# Patient Record
Sex: Female | Born: 1960
Health system: Southern US, Community
[De-identification: ages and names within clinical notes are randomized; demographics above are authoritative.]

## PROBLEM LIST (undated history)

## (undated) DIAGNOSIS — E538 Deficiency of other specified B group vitamins: Secondary | ICD-10-CM

## (undated) DIAGNOSIS — G609 Hereditary and idiopathic neuropathy, unspecified: Secondary | ICD-10-CM

## (undated) DIAGNOSIS — H9190 Unspecified hearing loss, unspecified ear: Secondary | ICD-10-CM

## (undated) DIAGNOSIS — Q254 Congenital malformation of aorta unspecified: Secondary | ICD-10-CM

## (undated) DIAGNOSIS — H8309 Labyrinthitis, unspecified ear: Secondary | ICD-10-CM

## (undated) DIAGNOSIS — R5381 Other malaise: Secondary | ICD-10-CM

## (undated) DIAGNOSIS — R5383 Other fatigue: Secondary | ICD-10-CM

## (undated) DIAGNOSIS — C801 Malignant (primary) neoplasm, unspecified: Secondary | ICD-10-CM

## (undated) DIAGNOSIS — M7500 Adhesive capsulitis of unspecified shoulder: Secondary | ICD-10-CM

## (undated) DIAGNOSIS — I719 Aortic aneurysm of unspecified site, without rupture: Secondary | ICD-10-CM

## (undated) DIAGNOSIS — Z923 Personal history of irradiation: Secondary | ICD-10-CM

## (undated) DIAGNOSIS — F419 Anxiety disorder, unspecified: Secondary | ICD-10-CM

## (undated) DIAGNOSIS — C50919 Malignant neoplasm of unspecified site of unspecified female breast: Secondary | ICD-10-CM

## (undated) DIAGNOSIS — Z9221 Personal history of antineoplastic chemotherapy: Secondary | ICD-10-CM

## (undated) DIAGNOSIS — H9319 Tinnitus, unspecified ear: Secondary | ICD-10-CM

## (undated) HISTORY — DX: Tinnitus, unspecified ear: H93.19

## (undated) HISTORY — DX: Labyrinthitis, unspecified ear: H83.09

## (undated) HISTORY — DX: Hereditary and idiopathic neuropathy, unspecified: G60.9

## (undated) HISTORY — DX: Adhesive capsulitis of unspecified shoulder: M75.00

## (undated) HISTORY — DX: Other fatigue: R53.83

## (undated) HISTORY — DX: Malignant (primary) neoplasm, unspecified: C80.1

## (undated) HISTORY — PX: OTHER SURGICAL HISTORY: SHX169

## (undated) HISTORY — DX: Anxiety disorder, unspecified: F41.9

## (undated) HISTORY — DX: Unspecified hearing loss, unspecified ear: H91.90

## (undated) HISTORY — DX: Congenital malformation of aorta unspecified: Q25.40

## (undated) HISTORY — PX: BREAST LUMPECTOMY: SHX2

## (undated) HISTORY — DX: Other malaise: R53.81

## (undated) HISTORY — DX: Deficiency of other specified B group vitamins: E53.8

## (undated) HISTORY — DX: Aortic aneurysm of unspecified site, without rupture: I71.9

## (undated) HISTORY — DX: Malignant neoplasm of unspecified site of unspecified female breast: C50.919

---

## 1979-05-01 HISTORY — PX: BREAST FIBROADENOMA SURGERY: SHX580

## 1980-04-30 HISTORY — PX: LAPAROSCOPY: SHX197

## 1999-11-22 ENCOUNTER — Encounter: Payer: Self-pay | Admitting: Neurology

## 1999-11-22 ENCOUNTER — Encounter: Admission: RE | Admit: 1999-11-22 | Discharge: 1999-11-22 | Payer: Self-pay | Admitting: Neurology

## 1999-11-26 ENCOUNTER — Ambulatory Visit (HOSPITAL_COMMUNITY): Admission: RE | Admit: 1999-11-26 | Discharge: 1999-11-26 | Payer: Self-pay | Admitting: Neurology

## 1999-11-26 ENCOUNTER — Encounter: Payer: Self-pay | Admitting: Neurology

## 2000-05-09 ENCOUNTER — Encounter: Payer: Self-pay | Admitting: Orthopedic Surgery

## 2000-05-09 ENCOUNTER — Ambulatory Visit (HOSPITAL_COMMUNITY): Admission: RE | Admit: 2000-05-09 | Discharge: 2000-05-09 | Payer: Self-pay | Admitting: Orthopedic Surgery

## 2000-07-05 ENCOUNTER — Other Ambulatory Visit: Admission: RE | Admit: 2000-07-05 | Discharge: 2000-07-05 | Payer: Self-pay | Admitting: Obstetrics and Gynecology

## 2001-04-30 HISTORY — PX: BASAL CELL CARCINOMA EXCISION: SHX1214

## 2001-07-17 ENCOUNTER — Other Ambulatory Visit: Admission: RE | Admit: 2001-07-17 | Discharge: 2001-07-17 | Payer: Self-pay | Admitting: Obstetrics and Gynecology

## 2001-11-03 ENCOUNTER — Ambulatory Visit: Admission: RE | Admit: 2001-11-03 | Discharge: 2001-11-03 | Payer: Self-pay | Admitting: Internal Medicine

## 2002-01-26 ENCOUNTER — Encounter: Admission: RE | Admit: 2002-01-26 | Discharge: 2002-01-26 | Payer: Self-pay | Admitting: Obstetrics and Gynecology

## 2002-01-26 ENCOUNTER — Encounter: Payer: Self-pay | Admitting: Obstetrics and Gynecology

## 2002-01-29 ENCOUNTER — Ambulatory Visit (HOSPITAL_COMMUNITY): Admission: RE | Admit: 2002-01-29 | Discharge: 2002-01-29 | Payer: Self-pay | Admitting: Obstetrics and Gynecology

## 2002-03-09 ENCOUNTER — Emergency Department (HOSPITAL_COMMUNITY): Admission: EM | Admit: 2002-03-09 | Discharge: 2002-03-09 | Payer: Self-pay | Admitting: Emergency Medicine

## 2002-09-16 ENCOUNTER — Encounter: Payer: Self-pay | Admitting: Obstetrics and Gynecology

## 2002-09-16 ENCOUNTER — Encounter: Admission: RE | Admit: 2002-09-16 | Discharge: 2002-09-16 | Payer: Self-pay | Admitting: Obstetrics and Gynecology

## 2002-11-25 ENCOUNTER — Encounter: Payer: Self-pay | Admitting: Orthopedic Surgery

## 2002-12-01 ENCOUNTER — Ambulatory Visit (HOSPITAL_COMMUNITY): Admission: RE | Admit: 2002-12-01 | Discharge: 2002-12-01 | Payer: Self-pay | Admitting: Orthopedic Surgery

## 2003-06-06 ENCOUNTER — Ambulatory Visit (HOSPITAL_COMMUNITY): Admission: RE | Admit: 2003-06-06 | Discharge: 2003-06-06 | Payer: Self-pay | Admitting: Orthopedic Surgery

## 2003-09-13 ENCOUNTER — Other Ambulatory Visit: Admission: RE | Admit: 2003-09-13 | Discharge: 2003-09-13 | Payer: Self-pay | Admitting: Obstetrics and Gynecology

## 2003-09-20 ENCOUNTER — Encounter: Admission: RE | Admit: 2003-09-20 | Discharge: 2003-09-20 | Payer: Self-pay | Admitting: Obstetrics and Gynecology

## 2004-10-09 ENCOUNTER — Ambulatory Visit: Payer: Self-pay | Admitting: Internal Medicine

## 2004-10-18 ENCOUNTER — Other Ambulatory Visit: Admission: RE | Admit: 2004-10-18 | Discharge: 2004-10-18 | Payer: Self-pay | Admitting: Obstetrics and Gynecology

## 2004-10-25 ENCOUNTER — Encounter: Admission: RE | Admit: 2004-10-25 | Discharge: 2004-10-25 | Payer: Self-pay | Admitting: Obstetrics and Gynecology

## 2004-11-03 ENCOUNTER — Encounter: Admission: RE | Admit: 2004-11-03 | Discharge: 2004-11-03 | Payer: Self-pay | Admitting: Obstetrics and Gynecology

## 2004-11-12 ENCOUNTER — Encounter: Admission: RE | Admit: 2004-11-12 | Discharge: 2004-11-12 | Payer: Self-pay | Admitting: Obstetrics and Gynecology

## 2004-11-13 ENCOUNTER — Encounter: Admission: RE | Admit: 2004-11-13 | Discharge: 2004-11-13 | Payer: Self-pay | Admitting: Obstetrics and Gynecology

## 2004-11-13 ENCOUNTER — Encounter (INDEPENDENT_AMBULATORY_CARE_PROVIDER_SITE_OTHER): Payer: Self-pay | Admitting: Specialist

## 2004-12-01 ENCOUNTER — Ambulatory Visit: Payer: Self-pay

## 2004-12-01 ENCOUNTER — Encounter: Admission: RE | Admit: 2004-12-01 | Discharge: 2004-12-01 | Payer: Self-pay | Admitting: Internal Medicine

## 2004-12-22 ENCOUNTER — Ambulatory Visit: Payer: Self-pay | Admitting: Internal Medicine

## 2005-01-03 ENCOUNTER — Ambulatory Visit: Payer: Self-pay | Admitting: Cardiology

## 2005-01-24 ENCOUNTER — Ambulatory Visit: Payer: Self-pay | Admitting: Cardiology

## 2005-02-05 ENCOUNTER — Ambulatory Visit: Payer: Self-pay | Admitting: Cardiology

## 2005-03-19 ENCOUNTER — Encounter: Admission: RE | Admit: 2005-03-19 | Discharge: 2005-03-19 | Payer: Self-pay | Admitting: Interventional Radiology

## 2005-05-17 ENCOUNTER — Encounter: Admission: RE | Admit: 2005-05-17 | Discharge: 2005-05-17 | Payer: Self-pay | Admitting: Internal Medicine

## 2005-05-25 ENCOUNTER — Encounter: Admission: RE | Admit: 2005-05-25 | Discharge: 2005-05-25 | Payer: Self-pay | Admitting: Obstetrics and Gynecology

## 2005-05-30 ENCOUNTER — Encounter: Admission: RE | Admit: 2005-05-30 | Discharge: 2005-05-30 | Payer: Self-pay | Admitting: Obstetrics and Gynecology

## 2005-09-10 ENCOUNTER — Other Ambulatory Visit: Admission: RE | Admit: 2005-09-10 | Discharge: 2005-09-10 | Payer: Self-pay | Admitting: Obstetrics and Gynecology

## 2005-09-13 ENCOUNTER — Ambulatory Visit: Payer: Self-pay | Admitting: Internal Medicine

## 2005-11-05 ENCOUNTER — Encounter: Admission: RE | Admit: 2005-11-05 | Discharge: 2005-11-05 | Payer: Self-pay | Admitting: Internal Medicine

## 2005-12-27 ENCOUNTER — Ambulatory Visit: Payer: Self-pay | Admitting: Internal Medicine

## 2006-04-08 ENCOUNTER — Ambulatory Visit: Payer: Self-pay | Admitting: Internal Medicine

## 2006-04-08 LAB — CONVERTED CEMR LAB
ALT: 19 units/L (ref 0–40)
Alkaline Phosphatase: 47 units/L (ref 39–117)
Bacteria, U Microscopic: NEGATIVE /hpf
Basophils Relative: 1.7 % — ABNORMAL HIGH (ref 0.0–1.0)
Crystals: NEGATIVE
GFR calc non Af Amer: 82 mL/min
Glomerular Filtration Rate, Af Am: 99 mL/min/{1.73_m2}
Glucose, Bld: 101 mg/dL — ABNORMAL HIGH (ref 70–99)
HDL: 51.8 mg/dL (ref >39.0)
Monocytes Absolute: 0.4 10*3/uL (ref 0.2–0.7)
Platelets: 344 10*3/uL (ref 150–400)
Potassium: 4.5 meq/L (ref 3.5–5.1)
RBC: 4.95 M/uL (ref 3.87–5.11)
RDW: 12.2 % (ref 11.5–14.6)
Sodium: 138 meq/L (ref 135–145)
Specific Gravity, Urine: 1.015 (ref 1.000–1.03)
Total Bilirubin: 1 mg/dL (ref 0.3–1.2)
Total Protein, Urine: NEGATIVE mg/dL
Total Protein: 6.5 g/dL (ref 6.0–8.3)
VLDL: 11 mg/dL (ref 0–40)
WBC: 9.9 10*3/uL (ref 4.5–10.5)
pH: 7 (ref 5.0–8.0)

## 2006-04-16 ENCOUNTER — Ambulatory Visit: Payer: Self-pay | Admitting: Internal Medicine

## 2006-04-30 DIAGNOSIS — Z923 Personal history of irradiation: Secondary | ICD-10-CM

## 2006-04-30 DIAGNOSIS — C801 Malignant (primary) neoplasm, unspecified: Secondary | ICD-10-CM

## 2006-04-30 HISTORY — DX: Malignant (primary) neoplasm, unspecified: C80.1

## 2006-04-30 HISTORY — DX: Personal history of irradiation: Z92.3

## 2006-05-03 ENCOUNTER — Encounter: Admission: RE | Admit: 2006-05-03 | Discharge: 2006-05-03 | Payer: Self-pay | Admitting: Internal Medicine

## 2006-05-17 ENCOUNTER — Encounter: Admission: RE | Admit: 2006-05-17 | Discharge: 2006-05-17 | Payer: Self-pay | Admitting: Internal Medicine

## 2006-06-20 ENCOUNTER — Ambulatory Visit: Payer: Self-pay | Admitting: Internal Medicine

## 2006-07-04 ENCOUNTER — Ambulatory Visit: Payer: Self-pay | Admitting: Internal Medicine

## 2006-07-04 ENCOUNTER — Emergency Department (HOSPITAL_COMMUNITY): Admission: EM | Admit: 2006-07-04 | Discharge: 2006-07-04 | Payer: Self-pay | Admitting: Emergency Medicine

## 2006-07-04 LAB — CONVERTED CEMR LAB
BUN: 20 mg/dL (ref 6–23)
Basophils Absolute: 0 10*3/uL (ref 0.0–0.1)
Basophils Relative: 0.2 % (ref 0.0–1.0)
CO2: 23 meq/L (ref 19–32)
Calcium: 9.1 mg/dL (ref 8.4–10.5)
Chloride: 102 meq/L (ref 96–112)
Creatinine, Ser: 0.8 mg/dL (ref 0.4–1.2)
Eosinophils Absolute: 0.1 10*3/uL (ref 0.0–0.6)
Eosinophils Relative: 1.1 % (ref 0.0–5.0)
GFR calc Af Amer: 99 mL/min
GFR calc non Af Amer: 82 mL/min
Glucose, Bld: 89 mg/dL (ref 70–99)
HCT: 43.7 % (ref 36.0–46.0)
Hemoglobin: 14.6 g/dL (ref 12.0–15.0)
Lymphocytes Relative: 25.8 % (ref 12.0–46.0)
MCHC: 33.5 g/dL (ref 30.0–36.0)
MCV: 90.8 fL (ref 78.0–100.0)
Monocytes Absolute: 0.6 10*3/uL (ref 0.2–0.7)
Monocytes Relative: 7.2 % (ref 3.0–11.0)
Neutro Abs: 5.7 10*3/uL (ref 1.4–7.7)
Neutrophils Relative %: 65.7 % (ref 43.0–77.0)
Platelets: 333 10*3/uL (ref 150–400)
Potassium: 4.9 meq/L (ref 3.5–5.1)
RBC: 4.81 M/uL (ref 3.87–5.11)
RDW: 12.6 % (ref 11.5–14.6)
Sodium: 136 meq/L (ref 135–145)
WBC: 8.6 10*3/uL (ref 4.5–10.5)

## 2006-07-10 ENCOUNTER — Inpatient Hospital Stay (HOSPITAL_COMMUNITY): Admission: AD | Admit: 2006-07-10 | Discharge: 2006-07-10 | Payer: Self-pay | Admitting: Obstetrics and Gynecology

## 2006-08-05 ENCOUNTER — Encounter: Admission: RE | Admit: 2006-08-05 | Discharge: 2006-08-05 | Payer: Self-pay | Admitting: Obstetrics and Gynecology

## 2006-08-11 ENCOUNTER — Encounter: Admission: RE | Admit: 2006-08-11 | Discharge: 2006-08-11 | Payer: Self-pay | Admitting: Obstetrics and Gynecology

## 2006-08-15 ENCOUNTER — Encounter: Admission: RE | Admit: 2006-08-15 | Discharge: 2006-08-15 | Payer: Self-pay | Admitting: Obstetrics and Gynecology

## 2006-08-19 ENCOUNTER — Ambulatory Visit (HOSPITAL_COMMUNITY): Admission: RE | Admit: 2006-08-19 | Discharge: 2006-08-19 | Payer: Self-pay | Admitting: Interventional Radiology

## 2006-08-22 ENCOUNTER — Ambulatory Visit (HOSPITAL_COMMUNITY): Admission: RE | Admit: 2006-08-22 | Discharge: 2006-08-23 | Payer: Self-pay | Admitting: Interventional Radiology

## 2006-09-10 ENCOUNTER — Encounter: Admission: RE | Admit: 2006-09-10 | Discharge: 2006-09-10 | Payer: Self-pay | Admitting: Interventional Radiology

## 2006-10-15 ENCOUNTER — Encounter: Admission: RE | Admit: 2006-10-15 | Discharge: 2006-10-15 | Payer: Self-pay | Admitting: Obstetrics and Gynecology

## 2006-11-08 ENCOUNTER — Encounter: Admission: RE | Admit: 2006-11-08 | Discharge: 2006-11-08 | Payer: Self-pay | Admitting: Obstetrics and Gynecology

## 2006-11-25 ENCOUNTER — Encounter: Admission: RE | Admit: 2006-11-25 | Discharge: 2006-11-25 | Payer: Self-pay | Admitting: Internal Medicine

## 2006-11-28 ENCOUNTER — Encounter: Admission: RE | Admit: 2006-11-28 | Discharge: 2006-11-28 | Payer: Self-pay | Admitting: Obstetrics and Gynecology

## 2007-01-20 ENCOUNTER — Encounter (INDEPENDENT_AMBULATORY_CARE_PROVIDER_SITE_OTHER): Payer: Self-pay | Admitting: Diagnostic Radiology

## 2007-01-20 ENCOUNTER — Encounter: Admission: RE | Admit: 2007-01-20 | Discharge: 2007-01-20 | Payer: Self-pay | Admitting: Internal Medicine

## 2007-01-29 ENCOUNTER — Ambulatory Visit: Payer: Self-pay | Admitting: Oncology

## 2007-02-03 ENCOUNTER — Ambulatory Visit: Admission: RE | Admit: 2007-02-03 | Discharge: 2007-03-20 | Payer: Self-pay | Admitting: Radiation Oncology

## 2007-02-05 ENCOUNTER — Encounter: Payer: Self-pay | Admitting: Internal Medicine

## 2007-02-24 ENCOUNTER — Encounter: Admission: RE | Admit: 2007-02-24 | Discharge: 2007-02-24 | Payer: Self-pay | Admitting: Surgery

## 2007-02-26 ENCOUNTER — Ambulatory Visit (HOSPITAL_BASED_OUTPATIENT_CLINIC_OR_DEPARTMENT_OTHER): Admission: RE | Admit: 2007-02-26 | Discharge: 2007-02-26 | Payer: Self-pay | Admitting: Surgery

## 2007-02-26 ENCOUNTER — Encounter (INDEPENDENT_AMBULATORY_CARE_PROVIDER_SITE_OTHER): Payer: Self-pay | Admitting: Surgery

## 2007-02-26 ENCOUNTER — Encounter: Payer: Self-pay | Admitting: Internal Medicine

## 2007-02-26 ENCOUNTER — Encounter: Admission: RE | Admit: 2007-02-26 | Discharge: 2007-02-26 | Payer: Self-pay | Admitting: Surgery

## 2007-03-10 ENCOUNTER — Encounter: Payer: Self-pay | Admitting: Internal Medicine

## 2007-03-11 ENCOUNTER — Encounter: Payer: Self-pay | Admitting: Internal Medicine

## 2007-03-13 ENCOUNTER — Encounter: Payer: Self-pay | Admitting: Internal Medicine

## 2007-03-19 ENCOUNTER — Encounter: Payer: Self-pay | Admitting: Internal Medicine

## 2007-03-19 ENCOUNTER — Ambulatory Visit (HOSPITAL_BASED_OUTPATIENT_CLINIC_OR_DEPARTMENT_OTHER): Admission: RE | Admit: 2007-03-19 | Discharge: 2007-03-19 | Payer: Self-pay | Admitting: Surgery

## 2007-03-19 ENCOUNTER — Encounter (INDEPENDENT_AMBULATORY_CARE_PROVIDER_SITE_OTHER): Payer: Self-pay | Admitting: Surgery

## 2007-03-26 ENCOUNTER — Ambulatory Visit: Payer: Self-pay | Admitting: Oncology

## 2007-04-01 ENCOUNTER — Encounter: Payer: Self-pay | Admitting: Internal Medicine

## 2007-04-01 LAB — CBC WITH DIFFERENTIAL/PLATELET
Basophils Absolute: 0.1 10*3/uL (ref 0.0–0.1)
EOS%: 2.2 % (ref 0.0–7.0)
Eosinophils Absolute: 0.2 10*3/uL (ref 0.0–0.5)
HCT: 39.8 % (ref 34.8–46.6)
HGB: 14.4 g/dL (ref 11.6–15.9)
MCH: 31.2 pg (ref 26.0–34.0)
NEUT%: 69.4 % (ref 39.6–76.8)
lymph#: 1.8 10*3/uL (ref 0.9–3.3)

## 2007-04-01 LAB — COMPREHENSIVE METABOLIC PANEL
AST: 15 U/L (ref 0–37)
Alkaline Phosphatase: 52 U/L (ref 39–117)
BUN: 13 mg/dL (ref 6–23)
Calcium: 9.5 mg/dL (ref 8.4–10.5)
Chloride: 103 mEq/L (ref 96–112)
Creatinine, Ser: 0.7 mg/dL (ref 0.40–1.20)
Total Bilirubin: 0.5 mg/dL (ref 0.3–1.2)

## 2007-04-04 LAB — VITAMIN D PNL(25-HYDRXY+1,25-DIHY)-BLD
Vit D, 1,25-Dihydroxy: 17 pg/mL (ref 6–62)
Vit D, 25-Hydroxy: 39 ng/mL (ref 30–89)

## 2007-04-08 ENCOUNTER — Encounter: Payer: Self-pay | Admitting: Internal Medicine

## 2007-04-10 LAB — ESTRADIOL, ULTRA SENS: Estradiol, Ultra Sensitive: 343 pg/mL

## 2007-04-15 ENCOUNTER — Encounter: Payer: Self-pay | Admitting: Internal Medicine

## 2007-04-15 LAB — CBC WITH DIFFERENTIAL/PLATELET
BASO%: 1.4 % (ref 0.0–2.0)
HCT: 39.8 % (ref 34.8–46.6)
MCHC: 35 g/dL (ref 32.0–36.0)
MONO#: 0.4 10*3/uL (ref 0.1–0.9)
RBC: 4.5 10*6/uL (ref 3.70–5.32)
WBC: 6.6 10*3/uL (ref 3.9–10.0)
lymph#: 2 10*3/uL (ref 0.9–3.3)

## 2007-04-22 ENCOUNTER — Encounter: Payer: Self-pay | Admitting: Internal Medicine

## 2007-04-22 LAB — CBC WITH DIFFERENTIAL/PLATELET
Basophils Absolute: 0.1 10*3/uL (ref 0.0–0.1)
HCT: 36.6 % (ref 34.8–46.6)
HGB: 12.9 g/dL (ref 11.6–15.9)
MONO#: 0.2 10*3/uL (ref 0.1–0.9)
NEUT%: 53.6 % (ref 39.6–76.8)
WBC: 4.2 10*3/uL (ref 3.9–10.0)
lymph#: 1.5 10*3/uL (ref 0.9–3.3)

## 2007-04-29 ENCOUNTER — Encounter: Admission: RE | Admit: 2007-04-29 | Discharge: 2007-04-29 | Payer: Self-pay | Admitting: Interventional Radiology

## 2007-04-29 LAB — CBC WITH DIFFERENTIAL/PLATELET
Basophils Absolute: 0.1 10*3/uL (ref 0.0–0.1)
EOS%: 4.6 % (ref 0.0–7.0)
HCT: 35.4 % (ref 34.8–46.6)
HGB: 12.5 g/dL (ref 11.6–15.9)
MCH: 31.1 pg (ref 26.0–34.0)
MCV: 87.9 fL (ref 81.0–101.0)
MONO%: 4.9 % (ref 0.0–13.0)
NEUT%: 54.2 % (ref 39.6–76.8)
lymph#: 1.8 10*3/uL (ref 0.9–3.3)

## 2007-04-30 ENCOUNTER — Ambulatory Visit: Payer: Self-pay | Admitting: Oncology

## 2007-05-05 ENCOUNTER — Encounter: Payer: Self-pay | Admitting: Internal Medicine

## 2007-05-05 LAB — CBC WITH DIFFERENTIAL/PLATELET
Basophils Absolute: 0.1 10*3/uL (ref 0.0–0.1)
EOS%: 4.1 % (ref 0.0–7.0)
HGB: 11.8 g/dL (ref 11.6–15.9)
LYMPH%: 36.4 % (ref 14.0–48.0)
MCH: 31.2 pg (ref 26.0–34.0)
MCV: 87.7 fL (ref 81.0–101.0)
MONO%: 5.1 % (ref 0.0–13.0)
NEUT%: 52.9 % (ref 39.6–76.8)
RDW: 10.4 % — ABNORMAL LOW (ref 11.3–14.5)

## 2007-05-13 ENCOUNTER — Encounter: Payer: Self-pay | Admitting: Internal Medicine

## 2007-05-13 LAB — URINALYSIS, MICROSCOPIC - CHCC
Bilirubin (Urine): NEGATIVE
Glucose: NEGATIVE g/dL
Nitrite: NEGATIVE
RBC count: NEGATIVE (ref 0–2)

## 2007-05-13 LAB — CBC WITH DIFFERENTIAL/PLATELET
BASO%: 2.2 % — ABNORMAL HIGH (ref 0.0–2.0)
EOS%: 3 % (ref 0.0–7.0)
MCH: 31.1 pg (ref 26.0–34.0)
MCHC: 35.8 g/dL (ref 32.0–36.0)
MCV: 86.9 fL (ref 81.0–101.0)
MONO%: 7.4 % (ref 0.0–13.0)
NEUT#: 3.1 10*3/uL (ref 1.5–6.5)
RBC: 3.97 10*6/uL (ref 3.70–5.32)
RDW: 11 % — ABNORMAL LOW (ref 11.3–14.5)

## 2007-05-15 LAB — URINE CULTURE

## 2007-05-20 ENCOUNTER — Encounter: Payer: Self-pay | Admitting: Internal Medicine

## 2007-05-20 LAB — CBC WITH DIFFERENTIAL/PLATELET
Basophils Absolute: 0.1 10*3/uL (ref 0.0–0.1)
Eosinophils Absolute: 0.2 10*3/uL (ref 0.0–0.5)
HGB: 12.4 g/dL (ref 11.6–15.9)
NEUT#: 3.5 10*3/uL (ref 1.5–6.5)
RBC: 3.97 10*6/uL (ref 3.70–5.32)
RDW: 11.1 % — ABNORMAL LOW (ref 11.3–14.5)
WBC: 5.9 10*3/uL (ref 3.9–10.0)
lymph#: 1.9 10*3/uL (ref 0.9–3.3)

## 2007-05-23 LAB — CBC WITH DIFFERENTIAL/PLATELET
Basophils Absolute: 0 10*3/uL (ref 0.0–0.1)
Eosinophils Absolute: 0.1 10*3/uL (ref 0.0–0.5)
HGB: 12.7 g/dL (ref 11.6–15.9)
LYMPH%: 21.4 % (ref 14.0–48.0)
MCV: 89.8 fL (ref 81.0–101.0)
MONO#: 0.1 10*3/uL (ref 0.1–0.9)
MONO%: 3.1 % (ref 0.0–13.0)
NEUT#: 3.2 10*3/uL (ref 1.5–6.5)
Platelets: 378 10*3/uL (ref 145–400)
RDW: 14.2 % (ref 11.3–14.5)
WBC: 4.4 10*3/uL (ref 3.9–10.0)

## 2007-05-23 LAB — COMPREHENSIVE METABOLIC PANEL
Albumin: 4.1 g/dL (ref 3.5–5.2)
Alkaline Phosphatase: 52 U/L (ref 39–117)
BUN: 10 mg/dL (ref 6–23)
CO2: 26 mEq/L (ref 19–32)
Glucose, Bld: 107 mg/dL — ABNORMAL HIGH (ref 70–99)
Potassium: 4.3 mEq/L (ref 3.5–5.3)
Sodium: 137 mEq/L (ref 135–145)
Total Protein: 6.6 g/dL (ref 6.0–8.3)

## 2007-05-26 ENCOUNTER — Encounter: Payer: Self-pay | Admitting: Internal Medicine

## 2007-05-26 DIAGNOSIS — M7500 Adhesive capsulitis of unspecified shoulder: Secondary | ICD-10-CM

## 2007-05-26 DIAGNOSIS — Z85828 Personal history of other malignant neoplasm of skin: Secondary | ICD-10-CM

## 2007-05-27 LAB — COMPREHENSIVE METABOLIC PANEL
Alkaline Phosphatase: 54 U/L (ref 39–117)
CO2: 24 mEq/L (ref 19–32)
Creatinine, Ser: 0.73 mg/dL (ref 0.40–1.20)
Glucose, Bld: 97 mg/dL (ref 70–99)
Sodium: 141 mEq/L (ref 135–145)
Total Bilirubin: 0.3 mg/dL (ref 0.3–1.2)
Total Protein: 6.9 g/dL (ref 6.0–8.3)

## 2007-05-27 LAB — CBC WITH DIFFERENTIAL/PLATELET
Basophils Absolute: 0.1 10*3/uL (ref 0.0–0.1)
Eosinophils Absolute: 0.2 10*3/uL (ref 0.0–0.5)
HGB: 12.4 g/dL (ref 11.6–15.9)
MCV: UNDETERMINED fL (ref 81.0–101.0)
MONO#: 0.3 10*3/uL (ref 0.1–0.9)
NEUT#: 3.5 10*3/uL (ref 1.5–6.5)
RBC: UNDETERMINED 10*6/uL (ref 3.70–5.32)
RDW: 11 % — ABNORMAL LOW (ref 11.3–14.5)
WBC: 5.7 10*3/uL (ref 3.9–10.0)
lymph#: 1.7 10*3/uL (ref 0.9–3.3)

## 2007-06-02 ENCOUNTER — Encounter: Payer: Self-pay | Admitting: Internal Medicine

## 2007-06-03 ENCOUNTER — Encounter: Payer: Self-pay | Admitting: Internal Medicine

## 2007-06-03 LAB — CBC WITH DIFFERENTIAL/PLATELET
BASO%: 0.6 % (ref 0.0–2.0)
Basophils Absolute: 0 10*3/uL (ref 0.0–0.1)
Eosinophils Absolute: 0.1 10*3/uL (ref 0.0–0.5)
HCT: 36.7 % (ref 34.8–46.6)
LYMPH%: 34.6 % (ref 14.0–48.0)
MCHC: 34 g/dL (ref 32.0–36.0)
MONO#: 0.3 10*3/uL (ref 0.1–0.9)
NEUT%: 57.6 % (ref 39.6–76.8)
Platelets: 429 10*3/uL — ABNORMAL HIGH (ref 145–400)
WBC: 6 10*3/uL (ref 3.9–10.0)

## 2007-06-04 LAB — COMPREHENSIVE METABOLIC PANEL
AST: 24 U/L (ref 0–37)
Alkaline Phosphatase: 58 U/L (ref 39–117)
BUN: 18 mg/dL (ref 6–23)
Creatinine, Ser: 0.78 mg/dL (ref 0.40–1.20)
Total Bilirubin: 0.3 mg/dL (ref 0.3–1.2)

## 2007-06-05 ENCOUNTER — Encounter: Admission: RE | Admit: 2007-06-05 | Discharge: 2007-06-05 | Payer: Self-pay | Admitting: Radiation Oncology

## 2007-06-06 LAB — VITAMIN D 1,25 DIHYDROXY: Vit D, 1,25-Dihydroxy: 26 pg/mL (ref 6–62)

## 2007-06-11 LAB — CBC WITH DIFFERENTIAL/PLATELET
BASO%: 1.2 % (ref 0.0–2.0)
Basophils Absolute: 0.1 10*3/uL (ref 0.0–0.1)
HCT: 34.6 % — ABNORMAL LOW (ref 34.8–46.6)
HGB: 12.5 g/dL (ref 11.6–15.9)
MCHC: 36 g/dL (ref 32.0–36.0)
MONO#: 0.6 10*3/uL (ref 0.1–0.9)
NEUT%: 64.3 % (ref 39.6–76.8)
WBC: 7.6 10*3/uL (ref 3.9–10.0)
lymph#: 1.9 10*3/uL (ref 0.9–3.3)

## 2007-06-13 ENCOUNTER — Ambulatory Visit: Admission: RE | Admit: 2007-06-13 | Discharge: 2007-08-14 | Payer: Self-pay | Admitting: Radiation Oncology

## 2007-06-24 ENCOUNTER — Encounter: Payer: Self-pay | Admitting: Internal Medicine

## 2007-06-24 ENCOUNTER — Ambulatory Visit: Payer: Self-pay | Admitting: Oncology

## 2007-07-15 LAB — COMPREHENSIVE METABOLIC PANEL
ALT: 16 U/L (ref 0–35)
CO2: 27 mEq/L (ref 19–32)
Calcium: 9 mg/dL (ref 8.4–10.5)
Chloride: 102 mEq/L (ref 96–112)
Glucose, Bld: 67 mg/dL — ABNORMAL LOW (ref 70–99)
Sodium: 138 mEq/L (ref 135–145)
Total Protein: 6.5 g/dL (ref 6.0–8.3)

## 2007-07-15 LAB — CBC WITH DIFFERENTIAL/PLATELET
BASO%: 0.6 % (ref 0.0–2.0)
Eosinophils Absolute: 0.3 10*3/uL (ref 0.0–0.5)
HCT: 40 % (ref 34.8–46.6)
LYMPH%: 20.8 % (ref 14.0–48.0)
MCHC: 34.1 g/dL (ref 32.0–36.0)
MONO#: 0.4 10*3/uL (ref 0.1–0.9)
NEUT#: 3.5 10*3/uL (ref 1.5–6.5)
NEUT%: 66.5 % (ref 39.6–76.8)
Platelets: 309 10*3/uL (ref 145–400)
RBC: 4.35 10*6/uL (ref 3.70–5.32)
WBC: 5.3 10*3/uL (ref 3.9–10.0)
lymph#: 1.1 10*3/uL (ref 0.9–3.3)

## 2007-07-15 LAB — CANCER ANTIGEN 27.29: CA 27.29: 41 U/mL — ABNORMAL HIGH (ref 0–39)

## 2007-07-22 ENCOUNTER — Encounter: Payer: Self-pay | Admitting: Internal Medicine

## 2007-07-28 LAB — CBC WITH DIFFERENTIAL/PLATELET
BASO%: 0.2 % (ref 0.0–2.0)
EOS%: 2.6 % (ref 0.0–7.0)
MCH: 31.9 pg (ref 26.0–34.0)
MCHC: 34.8 g/dL (ref 32.0–36.0)
MONO#: 0.4 10*3/uL (ref 0.1–0.9)
RBC: 4.65 10*6/uL (ref 3.70–5.32)
RDW: 13 % (ref 11.3–14.5)
WBC: 7.1 10*3/uL (ref 3.9–10.0)
lymph#: 1.7 10*3/uL (ref 0.9–3.3)

## 2007-07-29 LAB — COMPREHENSIVE METABOLIC PANEL
ALT: 14 U/L (ref 0–35)
AST: 20 U/L (ref 0–37)
Albumin: 4.8 g/dL (ref 3.5–5.2)
Calcium: 9.4 mg/dL (ref 8.4–10.5)
Chloride: 101 mEq/L (ref 96–112)
Creatinine, Ser: 0.98 mg/dL (ref 0.40–1.20)
Potassium: 3.9 mEq/L (ref 3.5–5.3)
Sodium: 139 mEq/L (ref 135–145)
Total Protein: 7.1 g/dL (ref 6.0–8.3)

## 2007-07-29 LAB — FOLLICLE STIMULATING HORMONE: FSH: 116.6 m[IU]/mL

## 2007-09-08 ENCOUNTER — Encounter: Payer: Self-pay | Admitting: Internal Medicine

## 2007-10-29 ENCOUNTER — Ambulatory Visit: Payer: Self-pay | Admitting: Oncology

## 2007-11-03 LAB — CBC WITH DIFFERENTIAL/PLATELET
Basophils Absolute: 0.1 10*3/uL (ref 0.0–0.1)
EOS%: 2.4 % (ref 0.0–7.0)
Eosinophils Absolute: 0.2 10*3/uL (ref 0.0–0.5)
HCT: 45 % (ref 34.8–46.6)
HGB: 15.6 g/dL (ref 11.6–15.9)
MCH: 30.7 pg (ref 26.0–34.0)
MCV: 88.4 fL (ref 81.0–101.0)
MONO%: 8.3 % (ref 0.0–13.0)
NEUT#: 4.1 10*3/uL (ref 1.5–6.5)
NEUT%: 61.9 % (ref 39.6–76.8)
lymph#: 1.8 10*3/uL (ref 0.9–3.3)

## 2007-11-03 LAB — COMPREHENSIVE METABOLIC PANEL
AST: 26 U/L (ref 0–37)
Albumin: 4.8 g/dL (ref 3.5–5.2)
BUN: 13 mg/dL (ref 6–23)
Calcium: 9.5 mg/dL (ref 8.4–10.5)
Chloride: 99 mEq/L (ref 96–112)
Creatinine, Ser: 0.85 mg/dL (ref 0.40–1.20)
Glucose, Bld: 92 mg/dL (ref 70–99)

## 2007-11-10 ENCOUNTER — Encounter: Admission: RE | Admit: 2007-11-10 | Discharge: 2007-11-10 | Payer: Self-pay | Admitting: Oncology

## 2007-11-10 ENCOUNTER — Encounter: Payer: Self-pay | Admitting: Internal Medicine

## 2007-12-31 ENCOUNTER — Ambulatory Visit: Payer: Self-pay | Admitting: Oncology

## 2008-01-06 LAB — CBC WITH DIFFERENTIAL/PLATELET
Basophils Absolute: 0 10*3/uL (ref 0.0–0.1)
HCT: 43.8 % (ref 34.8–46.6)
HGB: 15.1 g/dL (ref 11.6–15.9)
LYMPH%: 28.6 % (ref 14.0–48.0)
MCH: 31.6 pg (ref 26.0–34.0)
MONO#: 0.3 10*3/uL (ref 0.1–0.9)
NEUT%: 62.1 % (ref 39.6–76.8)
Platelets: 279 10*3/uL (ref 145–400)
lymph#: 1.3 10*3/uL (ref 0.9–3.3)

## 2008-01-07 LAB — COMPREHENSIVE METABOLIC PANEL
ALT: 20 U/L (ref 0–35)
AST: 21 U/L (ref 0–37)
Creatinine, Ser: 0.78 mg/dL (ref 0.40–1.20)
Total Bilirubin: 0.5 mg/dL (ref 0.3–1.2)

## 2008-01-13 ENCOUNTER — Encounter: Payer: Self-pay | Admitting: Internal Medicine

## 2008-01-16 ENCOUNTER — Encounter: Admission: RE | Admit: 2008-01-16 | Discharge: 2008-01-16 | Payer: Self-pay | Admitting: Oncology

## 2008-02-27 ENCOUNTER — Telehealth: Payer: Self-pay | Admitting: Internal Medicine

## 2008-02-28 ENCOUNTER — Encounter: Admission: RE | Admit: 2008-02-28 | Discharge: 2008-02-28 | Payer: Self-pay | Admitting: Otolaryngology

## 2008-03-04 ENCOUNTER — Ambulatory Visit: Payer: Self-pay | Admitting: Internal Medicine

## 2008-03-05 ENCOUNTER — Ambulatory Visit: Payer: Self-pay | Admitting: Oncology

## 2008-03-09 LAB — CBC WITH DIFFERENTIAL/PLATELET
BASO%: 0.7 % (ref 0.0–2.0)
EOS%: 5.8 % (ref 0.0–7.0)
HCT: 43.4 % (ref 34.8–46.6)
LYMPH%: 24.9 % (ref 14.0–48.0)
MCH: 31.4 pg (ref 26.0–34.0)
MCHC: 34.5 g/dL (ref 32.0–36.0)
MONO#: 0.4 10*3/uL (ref 0.1–0.9)
NEUT%: 61.5 % (ref 39.6–76.8)
Platelets: 269 10*3/uL (ref 145–400)
RBC: 4.77 10*6/uL (ref 3.70–5.32)
WBC: 5.3 10*3/uL (ref 3.9–10.0)

## 2008-03-09 LAB — LIPID PANEL
LDL Cholesterol: 116 mg/dL — ABNORMAL HIGH (ref 0–99)
Triglycerides: 81 mg/dL (ref <150)
VLDL: 16 mg/dL (ref 0–40)

## 2008-03-09 LAB — CANCER ANTIGEN 27.29: CA 27.29: 38 U/mL (ref 0–39)

## 2008-03-12 ENCOUNTER — Encounter: Admission: RE | Admit: 2008-03-12 | Discharge: 2008-03-12 | Payer: Self-pay | Admitting: Surgery

## 2008-03-16 ENCOUNTER — Encounter: Payer: Self-pay | Admitting: Internal Medicine

## 2008-03-16 LAB — COMPREHENSIVE METABOLIC PANEL
AST: 23 U/L (ref 0–37)
Albumin: 4.5 g/dL (ref 3.5–5.2)
Alkaline Phosphatase: 46 U/L (ref 39–117)
Glucose, Bld: 81 mg/dL (ref 70–99)
Potassium: 4 mEq/L (ref 3.5–5.3)
Sodium: 141 mEq/L (ref 135–145)
Total Bilirubin: 0.5 mg/dL (ref 0.3–1.2)
Total Protein: 6.9 g/dL (ref 6.0–8.3)

## 2008-03-18 LAB — ESTRADIOL, ULTRA SENS

## 2008-03-22 LAB — RESEARCH LABS

## 2008-04-08 ENCOUNTER — Encounter: Payer: Self-pay | Admitting: Internal Medicine

## 2008-04-27 ENCOUNTER — Encounter: Payer: Self-pay | Admitting: Internal Medicine

## 2008-06-04 ENCOUNTER — Ambulatory Visit: Payer: Self-pay | Admitting: Oncology

## 2008-06-07 LAB — CBC WITH DIFFERENTIAL/PLATELET
BASO%: 0.3 % (ref 0.0–2.0)
EOS%: 2.4 % (ref 0.0–7.0)
Eosinophils Absolute: 0.2 10*3/uL (ref 0.0–0.5)
LYMPH%: 22.3 % (ref 14.0–48.0)
MCHC: 34.7 g/dL (ref 32.0–36.0)
MCV: 91.2 fL (ref 81.0–101.0)
MONO%: 5.3 % (ref 0.0–13.0)
NEUT#: 5 10*3/uL (ref 1.5–6.5)
Platelets: 220 10*3/uL (ref 145–400)
RBC: 4.43 10*6/uL (ref 3.70–5.32)
RDW: 12.6 % (ref 11.3–14.5)

## 2008-06-07 LAB — COMPREHENSIVE METABOLIC PANEL
ALT: 21 U/L (ref 0–35)
AST: 29 U/L (ref 0–37)
Albumin: 4 g/dL (ref 3.5–5.2)
Alkaline Phosphatase: 45 U/L (ref 39–117)
Potassium: 3.7 mEq/L (ref 3.5–5.3)
Sodium: 141 mEq/L (ref 135–145)
Total Bilirubin: 0.4 mg/dL (ref 0.3–1.2)
Total Protein: 6.7 g/dL (ref 6.0–8.3)

## 2008-06-08 LAB — VITAMIN D 25 HYDROXY (VIT D DEFICIENCY, FRACTURES): Vit D, 25-Hydroxy: 32 ng/mL (ref 30–89)

## 2008-06-15 ENCOUNTER — Encounter: Payer: Self-pay | Admitting: Internal Medicine

## 2008-06-15 ENCOUNTER — Telehealth: Payer: Self-pay | Admitting: Internal Medicine

## 2008-06-16 ENCOUNTER — Encounter: Admission: RE | Admit: 2008-06-16 | Discharge: 2008-06-16 | Payer: Self-pay | Admitting: Oncology

## 2008-06-29 ENCOUNTER — Encounter: Admission: RE | Admit: 2008-06-29 | Discharge: 2008-06-29 | Payer: Self-pay | Admitting: Oncology

## 2008-07-02 ENCOUNTER — Encounter: Admission: RE | Admit: 2008-07-02 | Discharge: 2008-07-02 | Payer: Self-pay | Admitting: Oncology

## 2008-07-05 ENCOUNTER — Ambulatory Visit: Payer: Self-pay | Admitting: Internal Medicine

## 2008-07-05 LAB — CONVERTED CEMR LAB
Anti Nuclear Antibody(ANA): NEGATIVE
Rhuematoid fact SerPl-aCnc: <20 intl units/mL — ABNORMAL LOW (ref 0.0–20.0)

## 2008-07-09 ENCOUNTER — Ambulatory Visit: Payer: Self-pay | Admitting: Internal Medicine

## 2008-07-14 DIAGNOSIS — R5383 Other fatigue: Secondary | ICD-10-CM | POA: Insufficient documentation

## 2008-07-16 ENCOUNTER — Encounter: Admission: RE | Admit: 2008-07-16 | Discharge: 2008-07-16 | Payer: Self-pay | Admitting: Oncology

## 2008-08-11 ENCOUNTER — Ambulatory Visit: Payer: Self-pay | Admitting: Internal Medicine

## 2008-08-31 ENCOUNTER — Ambulatory Visit: Payer: Self-pay | Admitting: Oncology

## 2008-09-02 LAB — CBC WITH DIFFERENTIAL/PLATELET
BASO%: 0.5 % (ref 0.0–2.0)
EOS%: 3.1 % (ref 0.0–7.0)
LYMPH%: 27.6 % (ref 14.0–49.7)
MCH: 31.4 pg (ref 25.1–34.0)
MCHC: 34.6 g/dL (ref 31.5–36.0)
MONO#: 0.2 10*3/uL (ref 0.1–0.9)
Platelets: 276 10*3/uL (ref 145–400)
RBC: 4.4 10*6/uL (ref 3.70–5.45)
WBC: 4.5 10*3/uL (ref 3.9–10.3)
lymph#: 1.2 10*3/uL (ref 0.9–3.3)

## 2008-09-02 LAB — RESEARCH LABS

## 2008-09-03 LAB — COMPREHENSIVE METABOLIC PANEL
ALT: 18 U/L (ref 0–35)
AST: 21 U/L (ref 0–37)
CO2: 24 mEq/L (ref 19–32)
Creatinine, Ser: 0.79 mg/dL (ref 0.40–1.20)
Sodium: 139 mEq/L (ref 135–145)
Total Bilirubin: 0.4 mg/dL (ref 0.3–1.2)
Total Protein: 6.5 g/dL (ref 6.0–8.3)

## 2008-09-03 LAB — VITAMIN D 25 HYDROXY (VIT D DEFICIENCY, FRACTURES): Vit D, 25-Hydroxy: 49 ng/mL (ref 30–89)

## 2008-09-03 LAB — CANCER ANTIGEN 27.29: CA 27.29: 37 U/mL (ref 0–39)

## 2008-09-09 ENCOUNTER — Encounter: Payer: Self-pay | Admitting: Internal Medicine

## 2008-09-13 ENCOUNTER — Ambulatory Visit: Payer: Self-pay | Admitting: Internal Medicine

## 2008-10-21 ENCOUNTER — Ambulatory Visit: Payer: Self-pay | Admitting: Oncology

## 2008-11-10 ENCOUNTER — Encounter: Payer: Self-pay | Admitting: Internal Medicine

## 2008-11-11 ENCOUNTER — Ambulatory Visit: Payer: Self-pay | Admitting: Internal Medicine

## 2008-11-19 ENCOUNTER — Ambulatory Visit: Payer: Self-pay | Admitting: Oncology

## 2008-11-22 ENCOUNTER — Encounter: Payer: Self-pay | Admitting: Internal Medicine

## 2008-12-01 LAB — CBC WITH DIFFERENTIAL/PLATELET
Basophils Absolute: 0 10*3/uL (ref 0.0–0.1)
Eosinophils Absolute: 0.1 10*3/uL (ref 0.0–0.5)
HCT: 39.3 % (ref 34.8–46.6)
LYMPH%: 24.7 % (ref 14.0–49.7)
MONO#: 0.4 10*3/uL (ref 0.1–0.9)
NEUT#: 3.9 10*3/uL (ref 1.5–6.5)
NEUT%: 66.1 % (ref 38.4–76.8)
Platelets: 272 10*3/uL (ref 145–400)
WBC: 5.9 10*3/uL (ref 3.9–10.3)

## 2008-12-02 LAB — VITAMIN D 25 HYDROXY (VIT D DEFICIENCY, FRACTURES): Vit D, 25-Hydroxy: 50 ng/mL (ref 30–89)

## 2008-12-02 LAB — COMPREHENSIVE METABOLIC PANEL
CO2: 21 mEq/L (ref 19–32)
Creatinine, Ser: 0.88 mg/dL (ref 0.40–1.20)
Glucose, Bld: 118 mg/dL — ABNORMAL HIGH (ref 70–99)
Total Bilirubin: 0.3 mg/dL (ref 0.3–1.2)

## 2008-12-09 ENCOUNTER — Encounter: Payer: Self-pay | Admitting: Internal Medicine

## 2008-12-17 ENCOUNTER — Ambulatory Visit: Payer: Self-pay | Admitting: Oncology

## 2009-01-17 ENCOUNTER — Ambulatory Visit: Payer: Self-pay | Admitting: Oncology

## 2009-02-10 ENCOUNTER — Ambulatory Visit: Payer: Self-pay | Admitting: Oncology

## 2009-03-14 ENCOUNTER — Ambulatory Visit: Payer: Self-pay | Admitting: Oncology

## 2009-03-14 ENCOUNTER — Encounter: Payer: Self-pay | Admitting: Internal Medicine

## 2009-03-19 ENCOUNTER — Emergency Department (HOSPITAL_COMMUNITY): Admission: EM | Admit: 2009-03-19 | Discharge: 2009-03-19 | Payer: Self-pay | Admitting: Emergency Medicine

## 2009-03-19 ENCOUNTER — Ambulatory Visit: Payer: Self-pay | Admitting: Vascular Surgery

## 2009-04-18 ENCOUNTER — Encounter: Payer: Self-pay | Admitting: Internal Medicine

## 2009-05-05 ENCOUNTER — Ambulatory Visit: Payer: Self-pay | Admitting: Oncology

## 2009-06-06 ENCOUNTER — Ambulatory Visit: Payer: Self-pay | Admitting: Oncology

## 2009-06-06 LAB — COMPREHENSIVE METABOLIC PANEL
AST: 23 U/L (ref 0–37)
Albumin: 4.1 g/dL (ref 3.5–5.2)
Alkaline Phosphatase: 53 U/L (ref 39–117)
Potassium: 4.5 mEq/L (ref 3.5–5.3)
Sodium: 140 mEq/L (ref 135–145)
Total Protein: 6.4 g/dL (ref 6.0–8.3)

## 2009-06-06 LAB — CBC WITH DIFFERENTIAL/PLATELET
BASO%: 0.8 % (ref 0.0–2.0)
Basophils Absolute: 0 10*3/uL (ref 0.0–0.1)
EOS%: 3.7 % (ref 0.0–7.0)
HGB: 13.7 g/dL (ref 11.6–15.9)
MCH: 30.7 pg (ref 25.1–34.0)
RDW: 12.8 % (ref 11.2–14.5)
WBC: 5.1 10*3/uL (ref 3.9–10.3)
lymph#: 1.4 10*3/uL (ref 0.9–3.3)

## 2009-06-06 LAB — VITAMIN D 25 HYDROXY (VIT D DEFICIENCY, FRACTURES): Vit D, 25-Hydroxy: 57 ng/mL (ref 30–89)

## 2009-06-06 LAB — CANCER ANTIGEN 27.29: CA 27.29: 31 U/mL (ref 0–39)

## 2009-06-28 ENCOUNTER — Encounter: Admission: RE | Admit: 2009-06-28 | Discharge: 2009-06-28 | Payer: Self-pay | Admitting: Oncology

## 2009-07-04 ENCOUNTER — Encounter: Payer: Self-pay | Admitting: Internal Medicine

## 2009-07-06 LAB — ESTRADIOL, ULTRA SENS: Estradiol, Ultra Sensitive: 8 pg/mL

## 2009-08-12 ENCOUNTER — Ambulatory Visit: Payer: Self-pay | Admitting: Oncology

## 2009-08-24 LAB — ESTRADIOL, ULTRA SENS

## 2009-08-29 ENCOUNTER — Encounter: Payer: Self-pay | Admitting: Internal Medicine

## 2010-01-09 ENCOUNTER — Ambulatory Visit: Payer: Self-pay | Admitting: Internal Medicine

## 2010-01-09 DIAGNOSIS — E538 Deficiency of other specified B group vitamins: Secondary | ICD-10-CM

## 2010-01-10 DIAGNOSIS — H9190 Unspecified hearing loss, unspecified ear: Secondary | ICD-10-CM | POA: Insufficient documentation

## 2010-01-10 DIAGNOSIS — G609 Hereditary and idiopathic neuropathy, unspecified: Secondary | ICD-10-CM

## 2010-01-10 DIAGNOSIS — H8309 Labyrinthitis, unspecified ear: Secondary | ICD-10-CM | POA: Insufficient documentation

## 2010-01-10 DIAGNOSIS — H9319 Tinnitus, unspecified ear: Secondary | ICD-10-CM

## 2010-01-10 DIAGNOSIS — N809 Endometriosis, unspecified: Secondary | ICD-10-CM | POA: Insufficient documentation

## 2010-01-10 DIAGNOSIS — Z853 Personal history of malignant neoplasm of breast: Secondary | ICD-10-CM

## 2010-02-13 ENCOUNTER — Telehealth: Payer: Self-pay | Admitting: Internal Medicine

## 2010-03-13 ENCOUNTER — Ambulatory Visit: Payer: Self-pay | Admitting: Internal Medicine

## 2010-03-14 ENCOUNTER — Encounter: Payer: Self-pay | Admitting: Internal Medicine

## 2010-04-11 ENCOUNTER — Ambulatory Visit: Payer: Self-pay | Admitting: Oncology

## 2010-04-13 ENCOUNTER — Encounter: Payer: Self-pay | Admitting: Internal Medicine

## 2010-04-13 LAB — CBC WITH DIFFERENTIAL/PLATELET
BASO%: 0.4 % (ref 0.0–2.0)
Basophils Absolute: 0 10*3/uL (ref 0.0–0.1)
EOS%: 2.6 % (ref 0.0–7.0)
HGB: 13.9 g/dL (ref 11.6–15.9)
MCH: 30.2 pg (ref 25.1–34.0)
RDW: 12.7 % (ref 11.2–14.5)
lymph#: 1.6 10*3/uL (ref 0.9–3.3)

## 2010-04-13 LAB — COMPREHENSIVE METABOLIC PANEL
ALT: 21 U/L (ref 0–35)
AST: 31 U/L (ref 0–37)
Albumin: 4 g/dL (ref 3.5–5.2)
BUN: 9 mg/dL (ref 6–23)
Calcium: 8.7 mg/dL (ref 8.4–10.5)
Chloride: 106 mEq/L (ref 96–112)
Potassium: 4 mEq/L (ref 3.5–5.3)
Sodium: 141 mEq/L (ref 135–145)
Total Protein: 6.6 g/dL (ref 6.0–8.3)

## 2010-04-21 ENCOUNTER — Encounter: Payer: Self-pay | Admitting: Internal Medicine

## 2010-04-30 HISTORY — PX: ABDOMINAL HYSTERECTOMY: SUR658

## 2010-05-03 LAB — HM PAP SMEAR: HM Pap smear: NEGATIVE

## 2010-05-04 LAB — BASIC METABOLIC PANEL
BUN: 13 mg/dL (ref 6–23)
CO2: 29 mEq/L (ref 19–32)
Calcium: 8.6 mg/dL (ref 8.4–10.5)
Chloride: 103 mEq/L (ref 96–112)
Creatinine, Ser: 0.7 mg/dL (ref 0.4–1.2)
GFR calc Af Amer: >60 mL/min (ref >60)
GFR calc non Af Amer: >60 mL/min (ref >60)
Glucose, Bld: 81 mg/dL (ref 70–99)
Potassium: 3.8 mEq/L (ref 3.5–5.1)
Sodium: 139 mEq/L (ref 135–145)

## 2010-05-04 LAB — CBC
HCT: 43.5 % (ref 36.0–46.0)
Hemoglobin: 14.8 g/dL (ref 12.0–15.0)
MCH: 30.6 pg (ref 26.0–34.0)
MCHC: 34 g/dL (ref 30.0–36.0)
MCV: 89.9 fL (ref 78.0–100.0)
Platelets: 259 10*3/uL (ref 150–400)
RBC: 4.84 MIL/uL (ref 3.87–5.11)
RDW: 12.4 % (ref 11.5–15.5)
WBC: 5.9 10*3/uL (ref 4.0–10.5)

## 2010-05-08 ENCOUNTER — Encounter: Payer: Self-pay | Admitting: Internal Medicine

## 2010-05-08 ENCOUNTER — Ambulatory Visit
Admission: RE | Admit: 2010-05-08 | Discharge: 2010-05-08 | Payer: Self-pay | Source: Home / Self Care | Attending: Internal Medicine | Admitting: Internal Medicine

## 2010-05-08 DIAGNOSIS — Q254 Congenital malformation of aorta unspecified: Secondary | ICD-10-CM | POA: Insufficient documentation

## 2010-05-11 ENCOUNTER — Ambulatory Visit (HOSPITAL_COMMUNITY)
Admission: RE | Admit: 2010-05-11 | Discharge: 2010-05-11 | Payer: Self-pay | Source: Home / Self Care | Attending: Obstetrics and Gynecology | Admitting: Obstetrics and Gynecology

## 2010-05-11 ENCOUNTER — Encounter (INDEPENDENT_AMBULATORY_CARE_PROVIDER_SITE_OTHER): Payer: Self-pay | Admitting: Obstetrics and Gynecology

## 2010-05-15 LAB — PREGNANCY, URINE: Preg Test, Ur: NEGATIVE

## 2010-05-21 ENCOUNTER — Encounter: Payer: Self-pay | Admitting: Obstetrics and Gynecology

## 2010-05-21 ENCOUNTER — Encounter: Payer: Self-pay | Admitting: Oncology

## 2010-05-21 ENCOUNTER — Encounter: Payer: Self-pay | Admitting: Internal Medicine

## 2010-05-25 LAB — SURGICAL PCR SCREEN: MRSA, PCR: NEGATIVE

## 2010-05-30 NOTE — Letter (Signed)
Summary: Dermatology/[facility]  Dermatology/[facility]   Imported By: Sherian Rein 05/19/2009 13:42:12  _____________________________________________________________________  External Attachment:    Type:   Image     Comment:   External Document

## 2010-05-30 NOTE — Assessment & Plan Note (Signed)
Summary: LIGHT HEADED / NAUSEA / NOT FEELING WELL FOR A WHILE/NWS   Vital Signs:  Patient profile:   50 year old female Height:      67 inches Weight:      137 pounds BMI:     21.53 O2 Sat:      98 % on Room air Temp:     98.4 degrees F oral Pulse rate:   70 / minute BP sitting:   120 / 80  (left arm) Cuff size:   regular  Vitals Entered By: Alysia Penna (January 09, 2010 3:18 PM)  O2 Flow:  Room air  Primary Care Provider:  Jacques Navy MD   History of Present Illness: [name] is well known to me. She has survived breast cancer and treatment. She was treated with tamoxifen and was going to go on Arimidex but due to becoming perimenopausal with hormone level changes she has been restarted on Tamoxifen.  Post-chemotherapy she sustained related hearing loss. She was seen and evaluated by Dr. Dorma Russell including MRI/MRA brain that was normal and audiometric testing which did reveal hearing loss. Amplification was recommended which she tried and did not tolerate. She reports that she has had tinnitis since the on-set of hearing loss. The tinnitis and hearing loss have been progressive. she has had repeat audiometric testing which confirms progressive hearing loss. Although bilateral amplification has been recommended she is uncomfortable with this. Her main problem is right ear and has some difficulty with crowds and distraction.  For the past period oftime she has noted dysequilibrium to the point of bumping into walls and objects but not to the point of falls. She does not describe true vertigo. She has had no other focal neurolgoic symptoms.  Periodically she will experience tremulousness and akisthesia of the legs. She denies feeling jittery, upset or anxious in the usual sense of the word. She does exercise, do yoga and mediation and feels great equanimity at this point in her life. Her husband has had a major illness with surgery but all has turned out well and he is now fine.    Current Medications (verified): 1)  Effexor 37.5 Mg Tabs (Venlafaxine Hcl) 2)  Tamoxifen Citrate 20 Mg Tabs (Tamoxifen Citrate) .... Take 1 Tablet By Mouth Once A Day 3)  Vitamin E 400 Unit Caps (Vitamin E) .... Take 1 Tablet By Mouth Once A Day 4)  Vitamin D 1000 Unit Tabs (Cholecalciferol) .... Take 3 Tablet By Mouth Once A Day 5)  Calcium Magnesium .... Take 1 Tablet By Mouth Once A Day 6)  Green Tea .... As Directed 7)  B6 Complex .... As Directed 8)  Grape Seed Extract .... As Directed 9)  Ginger Root   Powd (Ginger Root) .... 2gms Daily 10)  Fish Oil 1000 Mg Caps (Omega-3 Fatty Acids) .... Take 2 Tablet By Mouth Once A Day 11)  Curcumen 400 .... Take 1 Tablet By Mouth Two Times A Day 12)  Daily Multiple Vitamins  Tabs (Multiple Vitamin) .Marland Kitchen.. 1 Daily 13)  Metamucil Plus Calcium  Caps (Psyllium-Calcium) .... Daily 14)  Daily Combo Multi Vitamins  Tabs (Multiple Vitamins-Minerals) 15)  Vitamin C .... I Two Times A Day 16)  Magnesium 250mg  .... 1 Two Times A Day  Allergies (verified): 1)  ! * Depo  Medrol 2)  ! * Synthetic Prednisone 3)  ! * Bee Sting 4)  ! * Pineapple  Past History:  Past Medical History: HEARING LOSS, BILATERAL (ICD-389.9) TINNITUS, CHRONIC, RIGHT (  ICD-388.30) ENDOMETRIOSIS (ICD-617.9) INVASIVE DUCTAL CARCINOMA, LEFT BREAST (ICD-174.9) VITAMIN B12 DEFICIENCY (ICD-266.2) OTHER MALAISE AND FATIGUE (ICD-780.79) CARCINOMA, BASAL CELL (ICD-173.9) ADHESIVE CAPSULITIS OF SHOULDER (ICD-726.0)         Physician Roster:                  Onc -  Dr. Darnelle Catalan                  Derm - Dr. Delanna Ahmadi [facility])                  Ortho - Dr. Amanda Pea                  Card - Dr. Lilian Kapur ([facility]), [facility]  Past Surgical History: Wisdom teeth extracted Fibroadenoma excised from her breast 1980 laparoscopy in 1982 for endometriosis Excision of a basal cell carcinoma inferior aspect of the lower eyelid Sugical manipulation for adhesive capsulitis right  shoulder 2002, then left shoulder 2004 Surgical excision invasive ductal carcinoma breast '08  Family History: Reviewed history from 07/09/2008 and no changes required. father- CVA, HTN, anklyosing Spondylitis, lipids mother - Seizure disorder, migraines, trigeminal neuralgia 2nd degree kin with erhlos-danlos 1st degree kin with breast Cancer: sister, grandmother  Social History: Reviewed history from 07/09/2008 and no changes required. 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  The patient denies anorexia, fever, weight loss, weight gain, hoarseness, chest pain, syncope, dyspnea on exertion, prolonged cough, abdominal pain, severe indigestion/heartburn, incontinence, muscle weakness, difficulty walking, and enlarged lymph nodes.    Physical Exam  General:  Well-developed,well-nourished,in no acute distress; alert,appropriate and cooperative throughout examination Head:  normocephalic and atraumatic.   Eyes:  pupils equal and pupils round.  C&S clear Neck:  supple.   Lungs:  normal respiratory effort.   Heart:  normal rate and regular rhythm.   Msk:  normal ROM.   Pulses:  2+ radial Neurologic:  alert & oriented X3, cranial nerves II-XII intact, and gait normal.   Skin:  turgor normal and color normal.   Psych:  Oriented X3, normally interactive, good eye contact, and not anxious appearing.     Impression & Recommendations:  Problem # 1:  TINNITUS, CHRONIC, RIGHT (ICD-388.30) Followed by Dr. Dorma Russell. It is more common for tinnitis to be stable rather than progressive. NO cure.  Plan - continue with white noise generator at night and background sounds during the day.  Problem # 2:  HEARING LOSS, BILATERAL (ICD-389.9) Discussed her hearing loss. She does do well in all her activities without amplification. She does report that her ears are very sensitive.  Plan - she should only use  hearing aids if there is clear benefit in terms of quality of life. I do not know that untreated/unamplified hearing loss leads to progressive hearing loss.   Problem # 3:  LABYRINTHITIS (ICD-386.30) Patient with mild dysequilibirium which can be associated with tamoxifen.  Plan - trial of meclizine 12.5 mg q 6 - if it helps then the diagnosis is more likely to be labyrinthitis as opposed to drug effect.  Problem # 4:  IDIOPATHIC TREMOR (ICD-781.0) patient with a tremulousness/akesthesia that feels internal and is visible in her legs. No apparent metabolic abnormality or physical illness. May be drug related - tamoxifen.  Plan - trial of alprazolam to control symptoms.  Problem # 5:  PERIPHERAL NEUROPATHY (ICD-356.9) History of peripheral neuropathy for  which she was receiving B12 injections q 2wks. This was stopped several months ago and she now reports that her symptoms are getting worse.  Plan - resume B12 q 2wks - Rx sent for multi-dose vial 30cc - her husband will do injections.            B12 level in two months.   Complete Medication List: 1)  Effexor 37.5 Mg Tabs (Venlafaxine hcl) 2)  Tamoxifen Citrate 20 Mg Tabs (Tamoxifen citrate) .... Take 1 tablet by mouth once a day 3)  Vitamin E 400 Unit Caps (Vitamin e) .... Take 1 tablet by mouth once a day 4)  Vitamin D 1000 Unit Tabs (Cholecalciferol) .... Take 3 tablet by mouth once a day 5)  Calcium Magnesium  .... Take 1 tablet by mouth once a day 6)  Green Tea  .... As directed 7)  B6 Complex  .... As directed 8)  Grape Seed Extract  .... As directed 9)  Ginger Root Powd (Ginger root) .... 2gms daily 10)  Fish Oil 1000 Mg Caps (Omega-3 fatty acids) .... Take 2 tablet by mouth once a day 11)  Curcumen 400  .... Take 1 tablet by mouth two times a day 12)  Daily Multiple Vitamins Tabs (Multiple vitamin) .Marland Kitchen.. 1 daily 13)  Metamucil Plus Calcium Caps (Psyllium-calcium) .... Daily 14)  Daily Combo Multi Vitamins Tabs  (Multiple vitamins-minerals) 15)  Vitamin C  .... I two times a day 16)  Magnesium 250mg   .... 1 two times a day 17)  Alprazolam 0.25 Mg Tabs (Alprazolam) .Marland Kitchen.. 1 by mouth q6 as needed 18)  Meclizine Hcl 12.5 Mg Tabs (Meclizine hcl) .Marland Kitchen.. 1 by mouth q4 as needed dysequilibrium 19)  Cyanocobalamin 1000 Mcg/ml Soln (Cyanocobalamin) .Marland Kitchen.. subcutaneously q 2 wks Prescriptions: CYANOCOBALAMIN 1000 MCG/ML SOLN (CYANOCOBALAMIN) Subcutaneously Q 2 WKS  #30 CC x 1   Entered and Authorized by:   Jacques Navy MD   Signed by:   Jacques Navy MD on 01/10/2010   Method used:   Electronically to        [facility] [phone]* (retail)       [address]       [place], [place]  [address]       Ph: [phone] or [phone]       Fax: [phone]   RxID:   [id number] MECLIZINE HCL 12.5 MG TABS (MECLIZINE HCL) 1 by mouth Q4 as needed DYSEQUILIBRIUM  #30 x 12   Entered and Authorized by:   Jacques Navy MD   Signed by:   Jacques Navy MD on 01/10/2010   Method used:   Handwritten   RxID:   [id number] ALPRAZOLAM 0.25 MG TABS (ALPRAZOLAM) 1 by mouth Q6 as needed  #30 x 2   Entered and Authorized by:   Jacques Navy MD   Signed by:   Jacques Navy MD on 01/10/2010   Method used:   Handwritten   RxID:   [id number]

## 2010-05-30 NOTE — Letter (Signed)
[facility]   Imported By: Sherian Rein 07/29/2009 14:45:42  _____________________________________________________________________  External Attachment:    Type:   Image     Comment:   External Document

## 2010-05-30 NOTE — Letter (Signed)
[facility]   Imported By: Sherian Rein 09/13/2009 12:08:22  _____________________________________________________________________  External Attachment:    Type:   Image     Comment:   External Document

## 2010-05-30 NOTE — Progress Notes (Signed)
  Phone Note Call from Patient   Caller: Patient Call For: Jacques Navy MD Reason for Call: Acute Illness Summary of Call: patient with contact dermatitis-sent picture. Requests hydrocortisone.  Follow-up for Phone Call        ok for methylprednisolone 4mg  pak.  Follow-up by: Jacques Navy MD,  February 13, 2010 9:16 AM    New/Updated Medications: METHYLPREDNISOLONE (PAK) 4 MG TABS (METHYLPREDNISOLONE) use as directed for contact dermatitis. Prescriptions: METHYLPREDNISOLONE (PAK) 4 MG TABS (METHYLPREDNISOLONE) use as directed for contact dermatitis.  #1 pak x 1   Entered and Authorized by:   Jacques Navy MD   Signed by:   Jacques Navy MD on 02/13/2010   Method used:   [facility] [phone][facility] (retail)       [address]       [place], [place]  [address]       Ph: [phone] or [phone]       Fax: [phone]   RxID:   [id number]   Appended Document:  [facility] called stating that pt is allergic methylprednisolone but is able to use hydrocortisone. Per Dr. Debby Bud ok 30mg  x 3day 20mg  x 3 days and  10mg  x3 days of hydrocortisone 10mg  tabs  Appended Document:     Clinical Lists Changes  Medications: Changed medication from METHYLPREDNISOLONE (PAK) 4 MG TABS (METHYLPREDNISOLONE) use as directed for contact dermatitis. to HYDROCORTISONE 10 MG TABS (HYDROCORTISONE) as directed - Signed Rx of HYDROCORTISONE 10 MG TABS (HYDROCORTISONE) as directed;  #19 x 0;  Signed;  Entered by: Rock Nephew CMA;  Authorized by: Jacques Navy MD;  Method used: Telephoned to Allergies: Added new allergy or adverse reaction of * METHYLPREDNISOLONE    Prescriptions: HYDROCORTISONE 10 MG TABS (HYDROCORTISONE) as directed  #19 x 0   Entered by:   Rock Nephew CMA   Authorized by:   Jacques Navy MD   Signed by:   Rock Nephew CMA on 02/13/2010   Method used:   Telephoned to ...         RxID:   [id number]  See phone note  append/la

## 2010-05-30 NOTE — Letter (Signed)
Summary: [facility]   Imported By: Lennie Odor 04/07/2010 11:16:52  _____________________________________________________________________  External Attachment:    Type:   Image     Comment:   External Document

## 2010-06-01 NOTE — Letter (Signed)
Summary: Dermatology/[facility]  Dermatology/[facility]   Imported By: Lester  05/19/2010 09:04:09  _____________________________________________________________________  External Attachment:    Type:   Image     Comment:   External Document

## 2010-06-01 NOTE — Assessment & Plan Note (Signed)
Summary: surgical clearence   Vital Signs:  Patient profile:   50 year old female Height:      67 inches Weight:      137 pounds BMI:     21.53 O2 Sat:      97 % on Room air Temp:     98.5 degrees F oral Pulse rate:   68 / minute BP sitting:   102 / 80  (left arm) Cuff size:   regular  Vitals Entered By: Bill Salinas CMA (May 08, 2010 11:22 AM)  O2 Flow:  Room air CC: pt here for surgical clearance total hysterectomy sch for thursday May 11, 2010/ ab   Primary Care Provider:  Jacques Navy MD  CC:  pt here for surgical clearance total hysterectomy sch for thursday Jan 12 and 2012/ ab.  History of Present Illness: Patient presents for preoperative clearance for robotic hysterectomy/oopherectomy secondary to fibroid tumors, recurrent ovarian cysts and endometriosis. Her past medical history is detailed below. She has been doing very well, functioning at a high level, participating in  community actities. Her only health issues at this time relate to her gyn problem.  The patient has a history of incidentally discovered enlarged aortic root. She has been seen by [facility] and [facility] in the past. Her problem is not that of an aortic aneurysm. Her last two studies include and MRA done in '08 which revealed an aortic root diameter of 3.7cm and a CT angio Nov '10 with a 3.8 cm diameter considered to be no significant change. She is totally asymptomatic and this is a very stable anatomical variant.  Current Medications (verified): 1)  Effexor 37.5 Mg Tabs (Venlafaxine Hcl) 2)  Tamoxifen Citrate 20 Mg Tabs (Tamoxifen Citrate) .... Take 1 Tablet By Mouth Once A Day 3)  Vitamin E 400 Unit Caps (Vitamin E) .... Take 1 Tablet By Mouth Once A Day 4)  Vitamin D 1000 Unit Tabs (Cholecalciferol) .... Take 3 Tablet By Mouth Once A Day 5)  Calcium Magnesium .... Take 1 Tablet By Mouth Once A Day 6)  Green Tea .... As Directed 7)  B6 Complex .... As Directed 8)  Grape Seed  Extract .... As Directed 9)  Ginger Root   Powd (Ginger Root) .... 2gms Daily 10)  Fish Oil 1000 Mg Caps (Omega-3 Fatty Acids) .... Take 2 Tablet By Mouth Once A Day 11)  Curcumen 400 .... Take 1 Tablet By Mouth Two Times A Day 12)  Daily Multiple Vitamins  Tabs (Multiple Vitamin) .Marland Kitchen.. 1 Daily 13)  Metamucil Plus Calcium  Caps (Psyllium-Calcium) .... Daily 14)  Daily Combo Multi Vitamins  Tabs (Multiple Vitamins-Minerals) 15)  Vitamin C .... I Two Times A Day 16)  Magnesium 250mg  .... 1 Two Times A Day 17)  Alprazolam 0.25 Mg Tabs (Alprazolam) .Marland Kitchen.. 1 By Mouth Q6 As Needed 18)  Meclizine Hcl 12.5 Mg Tabs (Meclizine Hcl) .Marland Kitchen.. 1 By Mouth Q4 As Needed Dysequilibrium 19)  Cyanocobalamin 1000 Mcg/ml Soln (Cyanocobalamin) .Marland Kitchen.. Subcutaneously Q 2 Wks 20)  Hydrocortisone 10 Mg Tabs (Hydrocortisone) .... As Directed  Allergies (verified): 1)  ! * Depo  Medrol 2)  ! * Synthetic Prednisone 3)  ! * Methylprednisolone 4)  ! * Bee Sting 5)  ! 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 Darnelle Catalan                  Derm - Dr. Delanna Ahmadi [facility])                   Ortho - Dr. Amanda Pea                  Card - Dr. Lilian Kapur ([facility]), [facility]  Past Surgical History: Reviewed history from 01/09/2010 and no changes required. Wisdom teeth extracted Fibroadenoma excised from her breast 1980 laparoscopy in 1982 for endometriosis Excision of a basal cell carcinoma inferior aspect of the lower eyelid Sugical manipulation for adhesive capsulitis right shoulder 2002, then left shoulder 2004 Surgical excision invasive ductal carcinoma breast '08  Family History: Reviewed history from 07/09/2008 and no changes required. father- CVA, HTN, anklyosing Spondylitis, lipids mother - Seizure disorder, migraines, trigeminal neuralgia 2nd degree kin with erhlos-danlos 1st degree kin with breast Cancer: sister, grandmother  Social History: Reviewed history from 07/09/2008 and no changes required. 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    Physical Exam  General:  Well-developed,well-nourished white woman ,in no acute distress; alert,appropriate and cooperative throughout examination Head:  normocephalic and atraumatic.   Eyes:  vision grossly intact, pupils equal, pupils round, corneas and lenses clear, and no injection.   Ears:  External ear exam shows no significant lesions or deformities.  Otoscopic examination reveals clear canals, tympanic membranes are intact bilaterally without bulging, retraction, inflammation or discharge. Hearing is grossly normal bilaterally. Mouth:  Oral mucosa and oropharynx without lesions or exudates.  Teeth in good repair. Neck:  supple, full ROM, no thyromegaly, and no carotid bruits.     Chest Wall:  no deformities.   Lungs:  Normal respiratory effort, chest expands symmetrically. Lungs are clear to auscultation, no crackles or wheezes. Heart:  Normal rate and regular rhythm. S1 and S2 normal without gallop, murmur, click, rub or other extra sounds. Abdomen:  soft and non-tender.   Msk:  no joint tenderness, no joint swelling, no joint warmth, and no joint deformities.   Pulses:  2+ radial Extremities:  No clubbing, cyanosis, edema, or deformity noted with normal full range of motion of all joints.   Neurologic:  alert & oriented X3, cranial nerves II-XII intact, and gait normal.   Skin:  turgor normal and color normal.   Cervical Nodes:  no anterior cervical adenopathy and no posterior cervical adenopathy.   Psych:  Oriented X3, memory intact for recent and remote, normally interactive, good eye contact, and not anxious appearing.     Impression & Recommendations:  Problem # 1:  PREOPERATIVE EXAMINATION (ICD-V72.84) Patient in good health, normal EKG done  today. Her record reviewed and there are no contra-indications to surgery or anesthesia. She appears very fit.   Complete Medication List: 1)  Effexor 37.5 Mg Tabs (Venlafaxine hcl) 2)  Tamoxifen Citrate 20 Mg Tabs (Tamoxifen citrate) .... Take 1 tablet by mouth once a day 3)  Vitamin E 400 Unit Caps (Vitamin e) .... Take 1 tablet by mouth once a day 4)  Vitamin D 1000 Unit Tabs (Cholecalciferol) .... Take 3 tablet by mouth once a day 5)  Calcium Magnesium  .... Take 1 tablet by mouth once a day 6)  Green Tea  .... As directed 7)  B6 Complex  .... As directed 8)  Grape Seed Extract  .... As directed 9)  Ginger Root Powd (Ginger root) .... 2gms daily 10)  Fish Oil 1000 Mg Caps (Omega-3 fatty acids) .... Take 2 tablet by mouth once a day 11)  Curcumen 400  .... Take 1 tablet by mouth two times a day 12)  Daily Multiple Vitamins Tabs (Multiple vitamin) .Marland Kitchen.. 1 daily 13)  Metamucil Plus Calcium Caps (Psyllium-calcium)  .... Daily 14)  Daily Combo Multi Vitamins Tabs (Multiple vitamins-minerals) 15)  Vitamin C  .... I two times a day 16)  Magnesium 250mg   .... 1 two times a day 17)  Alprazolam 0.25 Mg Tabs (Alprazolam) .Marland Kitchen.. 1 by mouth q6 as needed 18)  Meclizine Hcl 12.5 Mg Tabs (Meclizine hcl) .Marland Kitchen.. 1 by mouth q4 as needed dysequilibrium 19)  Cyanocobalamin 1000 Mcg/ml Soln (Cyanocobalamin) .Marland Kitchen.. subcutaneously q 2 wks 20)  Hydrocortisone 10 Mg Tabs (Hydrocortisone) .... As directed   Orders Added: 1)  Est. Patient Level III [98119]   Immunization History:  Influenza Immunization History:    Influenza:  historical (02/26/2010)   Immunization History:  Influenza Immunization History:    Influenza:  Historical (02/26/2010)

## 2010-06-01 NOTE — Letter (Signed)
Summary: [facility]  [facility]   Imported By: Lennie Odor 05/19/2010 14:51:55  _____________________________________________________________________  External Attachment:    Type:   Image     Comment:   External Document

## 2010-06-14 ENCOUNTER — Other Ambulatory Visit: Payer: Self-pay | Admitting: Oncology

## 2010-06-14 DIAGNOSIS — Z780 Asymptomatic menopausal state: Secondary | ICD-10-CM

## 2010-06-14 DIAGNOSIS — Z1231 Encounter for screening mammogram for malignant neoplasm of breast: Secondary | ICD-10-CM

## 2010-06-19 ENCOUNTER — Other Ambulatory Visit: Payer: Self-pay | Admitting: Oncology

## 2010-06-19 DIAGNOSIS — Z853 Personal history of malignant neoplasm of breast: Secondary | ICD-10-CM

## 2010-06-30 ENCOUNTER — Other Ambulatory Visit: Payer: Self-pay | Admitting: Oncology

## 2010-06-30 DIAGNOSIS — Z9889 Other specified postprocedural states: Secondary | ICD-10-CM

## 2010-07-04 ENCOUNTER — Ambulatory Visit
Admission: RE | Admit: 2010-07-04 | Discharge: 2010-07-04 | Disposition: A | Discharge status: Home / Self Care | Payer: PRIVATE HEALTH INSURANCE | Source: Ambulatory Visit | Attending: Oncology | Admitting: Oncology

## 2010-07-04 ENCOUNTER — Ambulatory Visit: Payer: Self-pay

## 2010-07-04 DIAGNOSIS — Z853 Personal history of malignant neoplasm of breast: Secondary | ICD-10-CM

## 2010-07-04 DIAGNOSIS — Z780 Asymptomatic menopausal state: Secondary | ICD-10-CM

## 2010-07-07 ENCOUNTER — Ambulatory Visit
Admission: RE | Admit: 2010-07-07 | Discharge: 2010-07-07 | Disposition: A | Discharge status: Home / Self Care | Payer: PRIVATE HEALTH INSURANCE | Source: Ambulatory Visit | Attending: Oncology | Admitting: Oncology

## 2010-07-07 DIAGNOSIS — Z9889 Other specified postprocedural states: Secondary | ICD-10-CM

## 2010-07-07 MED ORDER — GADOBENATE DIMEGLUMINE 529 MG/ML IV SOLN
12.0000 mL | Freq: Once | INTRAVENOUS | Status: AC | PRN
  Administered 2010-07-07: 12 mL via INTRAVENOUS

## 2010-07-24 ENCOUNTER — Encounter (HOSPITAL_BASED_OUTPATIENT_CLINIC_OR_DEPARTMENT_OTHER): Payer: PRIVATE HEALTH INSURANCE | Admitting: Oncology

## 2010-07-24 ENCOUNTER — Other Ambulatory Visit: Payer: Self-pay | Admitting: Oncology

## 2010-07-24 DIAGNOSIS — D259 Leiomyoma of uterus, unspecified: Secondary | ICD-10-CM

## 2010-07-24 DIAGNOSIS — C50019 Malignant neoplasm of nipple and areola, unspecified female breast: Secondary | ICD-10-CM

## 2010-07-24 DIAGNOSIS — Z170 Estrogen receptor positive status [ER+]: Secondary | ICD-10-CM

## 2010-07-25 LAB — COMPREHENSIVE METABOLIC PANEL
Albumin: 4.6 g/dL (ref 3.5–5.2)
Alkaline Phosphatase: 56 U/L (ref 39–117)
BUN: 13 mg/dL (ref 6–23)
CO2: 27 mEq/L (ref 19–32)
Calcium: 9.2 mg/dL (ref 8.4–10.5)
Chloride: 102 mEq/L (ref 96–112)
Glucose, Bld: 101 mg/dL — ABNORMAL HIGH (ref 70–99)
Potassium: 3.9 mEq/L (ref 3.5–5.3)
Sodium: 140 mEq/L (ref 135–145)
Total Protein: 6.9 g/dL (ref 6.0–8.3)

## 2010-07-25 LAB — VITAMIN D 25 HYDROXY (VIT D DEFICIENCY, FRACTURES): Vit D, 25-Hydroxy: 47 ng/mL (ref 30–89)

## 2010-07-25 LAB — CANCER ANTIGEN 27.29: CA 27.29: 43 U/mL — ABNORMAL HIGH (ref 0–39)

## 2010-07-31 ENCOUNTER — Encounter (HOSPITAL_BASED_OUTPATIENT_CLINIC_OR_DEPARTMENT_OTHER): Payer: PRIVATE HEALTH INSURANCE | Admitting: Oncology

## 2010-07-31 DIAGNOSIS — D259 Leiomyoma of uterus, unspecified: Secondary | ICD-10-CM

## 2010-07-31 DIAGNOSIS — C50019 Malignant neoplasm of nipple and areola, unspecified female breast: Secondary | ICD-10-CM

## 2010-07-31 DIAGNOSIS — Z170 Estrogen receptor positive status [ER+]: Secondary | ICD-10-CM

## 2010-08-02 LAB — BASIC METABOLIC PANEL
BUN: 11 mg/dL (ref 6–23)
Calcium: 9 mg/dL (ref 8.4–10.5)
Chloride: 105 mEq/L (ref 96–112)
Creatinine, Ser: 0.79 mg/dL (ref 0.4–1.2)
GFR calc Af Amer: >60 mL/min (ref >60)
GFR calc non Af Amer: >60 mL/min (ref >60)

## 2010-08-02 LAB — CBC
MCV: 92.2 fL (ref 78.0–100.0)
Platelets: 270 10*3/uL (ref 150–400)
RBC: 4.49 MIL/uL (ref 3.87–5.11)
WBC: 6.3 10*3/uL (ref 4.0–10.5)

## 2010-08-02 LAB — APTT: aPTT: 30 seconds (ref 24–37)

## 2010-08-02 LAB — POCT CARDIAC MARKERS
CKMB, poc: <1 ng/mL — ABNORMAL LOW (ref 1.0–8.0)
Myoglobin, poc: 47.7 ng/mL (ref 12–200)
Myoglobin, poc: 54.6 ng/mL (ref 12–200)

## 2010-08-02 LAB — PROTIME-INR
INR: 1.02 (ref 0.00–1.49)
Prothrombin Time: 13.3 seconds (ref 11.6–15.2)

## 2010-08-02 LAB — DIFFERENTIAL
Eosinophils Absolute: 0.1 10*3/uL (ref 0.0–0.7)
Lymphs Abs: 2.1 10*3/uL (ref 0.7–4.0)
Monocytes Relative: 8 % (ref 3–12)
Neutro Abs: 3.6 10*3/uL (ref 1.7–7.7)
Neutrophils Relative %: 57 % (ref 43–77)

## 2010-08-07 ENCOUNTER — Ambulatory Visit (INDEPENDENT_AMBULATORY_CARE_PROVIDER_SITE_OTHER): Payer: PRIVATE HEALTH INSURANCE | Admitting: Internal Medicine

## 2010-08-07 VITALS — BP 122/88 | HR 70 | Temp 98.5°F | Wt 133.0 lb

## 2010-08-07 DIAGNOSIS — T887XXA Unspecified adverse effect of drug or medicament, initial encounter: Secondary | ICD-10-CM

## 2010-08-07 DIAGNOSIS — J309 Allergic rhinitis, unspecified: Secondary | ICD-10-CM

## 2010-08-07 NOTE — Progress Notes (Signed)
  Subjective:    Patient ID: [name], female    DOB: Sep 03, 1960, 50 y.o.   MRN: [id number]  HPI [name] is seen as a walk in. She reports 1 weeks h/o pressure in the right frontal and maxillary sinus, pressure in the right eat described as a mild earache. She has had some post-nasal drainage. No documented fever, chills, shortness of breath, wheezing, cough or sore throat.  PMH, FamHx and SocHx reviewed for any changes and relevance. (referred back to centricity) She has started Femara following hysterectomy.   Review of Systems Review of Systems  Constitutional:  Negative for fever, chills, activity change and unexpected weight change.  HENT:  Negative for hearing loss, neck stiffness and postnasal drip.  Positive for sinus pressure, pressure in the right ear. Eyes: Negative for pain, discharge and visual disturbance.  Respiratory: Negative for chest tightness and wheezing.   Cardiovascular: Negative for chest pain and palpitations.       [No decreased exercise tolerance Gastrointestinal: [No change in bowel habit. No bloating or gas. No reflux or indigestion Genitourinary: Negative for urgency, frequency, flank pain and difficulty urinating.  Musculoskeletal: Negative for myalgias, back pain, arthralgias and gait problem.  Neurological: Negative for dizziness, tremors, weakness and headaches.  Hematological: Negative for adenopathy.  Psychiatric/Behavioral: Negative for behavioral problems and dysphoric mood.       Objective:   Physical Exam  [vitalsreviewed. Constitutional: She is oriented to person, place, and time. She appears well-developed and well-nourished. No distress.  HENT:  Head: Normocephalic and atraumatic.  Right Ear: Hearing, tympanic membrane and ear canal normal. No swelling. Tympanic membrane is not injected and not bulging. No decreased hearing is noted.  Mouth/Throat: Uvula is midline, oropharynx is clear and moist and mucous membranes are normal. No oral  lesions. Normal dentition. No dental abscesses or dental caries.       Normal TM movement with insuffalation.  Eyes: Pupils are equal, round, and reactive to light.  Cardiovascular: Normal rate and regular rhythm.   Pulmonary/Chest: Breath sounds normal. She has no wheezes.  Neurological: She is alert and oriented to person, place, and time. She has normal reflexes. No cranial nerve deficit.  Skin: Skin is warm.  Psychiatric: She has a normal mood and affect. Her behavior is normal. Thought content normal.          Assessment & Plan:  1. Allergic rhinitis - no evidence of infection on exam. Suspect unilateral sinus congestion w/ eustachian congestion right.  Plan - pseudoephedrine 30mb bid           Nasal saline wash and/or stove-top vaporizer treatments           OTC loratadine for nasal drip          Call for worsening symptoms.  2. Medication side effect monitoring (995.20) - She has started on Femara. ONe known side effect is hypercholesetrolemia.  Plan - lipid profile approx one month after starting Femara.

## 2010-09-12 NOTE — Op Note (Signed)
NAMEJODYE, [name]            ACCOUNT NO.:  [id number]   MEDICAL RECORD NO.:  [id number]          PATIENT TYPE:  AMB   LOCATION:  [facility]                         FACILITY:  [facility]   PHYSICIAN:  Currie Paris, M.D.DATE OF BIRTH:  1960-12-13   DATE OF PROCEDURE:  03/19/2007  DATE OF DISCHARGE:                               OPERATIVE REPORT   CCS [id number].   PREOPERATIVE DIAGNOSIS:  Carcinoma right breast 6 o'clock position,  status post excision with close and positive margins.   OPERATION:  Re-excision of lumpectomy cavity (partial mastectomy).   SURGEON:  Currie Paris, M.D.   ANESTHESIA:  General.   CLINICAL HISTORY:  [name] is a 50 year old lady who underwent a  needle guided excision of what was initially thought to be DCIS in the 6  o'clock position.  Unfortunately, she had invasive carcinoma.  The  invasive carcinoma was close to a deep margin and DCIS was close to both  anterior and the deep margins.  It was decided to re-excise her an  attempt to get negative margins so she could have breast conserving  therapy.   DESCRIPTION OF PROCEDURE:  The patient was seen in the holding area.  She had no further questions.  We reviewed the plans for the procedure.  We already discussed potential deformities from a wider excision.  The  patient was taken to the operating room and after satisfactory general  anesthesia had been obtained, the right breast was prepped and draped.  The time-out was performed.   I opened the old incision which was in the lower half of the areolar  margin and then had extended inferiorly.  I began by raising some very  superficial skin subcutaneous flaps so that I could get a re-excision of  the anterior margin and then went around superiorly near the nipple-  areolar complex and then went down to the chest wall.  The patient has  fairly small breast and there was not a lot of breast tissue in this  inferior aspect of her breast.  As I  got in a little bit, we entered the  lumpectomy cavity and once I was in that, I used that as a guideline to  take out essentially the entire lumpectomy cavity with the exception of  a little bit towards the inferior edge where I thought we were well away  from tumor originally.  I labeled this for pathology.  I spent several  minutes making sure everything was dry.  I injected some Marcaine to  help with postop pain relief.  I waited several minutes while we  irrigated and checked for hemostasis.  We had a very large lumpectomy  cavity that I do not think was closable short of some significant  plastic surgical intervention, but I thought that hopefully we would  again get a seroma cavity that hopefully would leave not too much  deformity although she was aware she might subsequently have to have  some plastic surgery.   I closed the subcu with some 3-0 Vicryl and skin with 4-0 nylon.  The  patient tolerated the procedure well.  No operative complications.  All  counts were correct.      Currie Paris, M.D.  Electronically Signed     CJS/MEDQ  D:  03/19/2007  T:  03/20/2007  Job:  [id number]   cc:   Rosalyn Gess. Norins, MD  [address]  [place]  [place] [address]   Valentino Hue. Magrinat, M.D.  Fax: [phone]

## 2010-09-12 NOTE — Op Note (Signed)
[name]            ACCOUNT NO.:  [id number]   MEDICAL RECORD NO.:  [id number]          PATIENT TYPE:  AMB   LOCATION:  DSC                          FACILITY:  [facility]   PHYSICIAN:  Currie Paris, M.D.DATE OF BIRTH:  11-08-1960   DATE OF PROCEDURE:  02/26/2007  DATE OF DISCHARGE:  02/26/2007                               OPERATIVE REPORT   OFFICE MEDICAL RECORD NUMBER [id number] [id number].   PREOPERATIVE DIAGNOSIS:  Carcinoma of the right breast, 6 o'clock  position.  Clinical stage 0.   POSTOPERATIVE DIAGNOSIS:  Carcinoma of the right breast, 6 o'clock  position.  Clinical stage 0.   OPERATION:  Needle-guided right partial mastectomy with blue dye  injection and axillary sentinel lymph node biopsy (one node).   SURGEON:  Currie Paris, M.D.   ANESTHESIA:  General.   CLINICAL HISTORY:  This is a 50 year old lady who has presented with a  small area seen and biopsied in the 6 o'clock position showing DCIS.  A  clip was placed and we planned for a wide local excision with sentinel  node evaluation; because there is some concern because of high-grade  DCIS that she could have invasive disease mixed in.  She has also been  preoperatively seen by oncology and had BRCA testing which was negative.   DESCRIPTION OF PROCEDURE:  The patient was seen in the holding area and  she had no further questions.  We confirmed and marked the right breast  as the operative side.  Her guidewire had already been placed and I  reviewed the films, and spoke with Dr. Nicholos Johns about the positioning and  location of the clips, etc..  She had already been injected with a  radioisotope.   The patient was taken to the operating room, and after satisfactory  general anesthesia had been obtained, we prepped the right breast and  did the time-out.  I injected 5 mL of methylene blue subareolarly and  massaged that in.   An entire formal prep and drape was then done.  The guidewire entered at  about  the 6 o'clock position and traveled towards the nipple.  I elected  to make a vertical incision directly over the guidewire tract.  I then  extended at the areolar margin in both directions, curving along the  bottom half of the areola.  I raised the skin flaps medially and  laterally, and then went down to the chest wall inferior to the  guidewires -- so that I was beneath and at the inframammary fold.  I  then took out a wide area of breast tissue by grasping the breast tissue  on either side of the guidewire; and working from anterior to superior  until I got up to the edge of  areolar margin, where I divided the  tissue off of the undersurface of the areola.  It was heavily stained  blue in this area from the injection.  I thought I was well around the  guidewire in all directions, and down to and included fascia from the  chest wall.   I made sure everything was  dry.  I injected 0.25% plain Marcaine to help  with postoperative analgesia.  I then placed a small moist pack.   Attention was turned to the axilla.  Using the Neoprobe I identified a  hot area and marked that.  I made a transverse incision, and using the  Neoprobe divided some of the subcutaneous tissues and found a small blue  lymphatic leading to a blue lymph node, which was removed.  It had  counts of about 300.  Once that was removed I could find no other blue  lymph nodes, no palpably abnormal nodes, and no areas of counts above 10-  20.   Again, I closed with some Marcaine for postoperative analgesia.  I  closed this incision with 3-0 Vicryl and 4-0 Monocryl subcuticular, and  finally Dermabond.   Attention was turned back to the breast.  I made sure everything was dry  and had remained completely dry while I was working in the axilla.  I  then closed in layers to close the subcutaneous and then the skin;  leaving the cavity to fill with fluid and hope that this would give [place] a  good cosmetic result by not trying  to move breast tissue to close the  defect.   Radiology reported that the clip appeared to be in good positioning in  the specimen mammogram.  Pathology reported that the sentinel lymph node  was negative for metastatic disease.   Sterile dressings were applied.  The patient tolerated the procedure  well.  There were operative complications.  All counts were correct.      Currie Paris, M.D.  Electronically Signed     CJS/MEDQ  D:  02/26/2007  T:  02/26/2007  Job:  [id number]

## 2010-09-15 NOTE — Op Note (Signed)
NAME:  [name], [name]                      ACCOUNT NO.:  [id number]   MEDICAL RECORD NO.:  [id number]                   PATIENT TYPE:  AMB   LOCATION:  SDC                                  FACILITY:  [facility]   PHYSICIAN:  Malva Limes, M.D.                 DATE OF BIRTH:  1960-10-22   DATE OF PROCEDURE:  01/29/2002  DATE OF DISCHARGE:                                 OPERATIVE REPORT   PREOPERATIVE DIAGNOSES:  1. Chronic pelvic pain.  2. Dyspareunia.  3. History of endometriosis.   POSTOPERATIVE DIAGNOSES:  1. Chronic pelvic pain.  2. Dyspareunia.  3. History of endometriosis.  4. Uterine fibroid.   PROCEDURE:  1. Diagnostic laparoscopy.  2. Cautery of endometriosis.   SURGEON:  Malva Limes, M.D.   ANESTHESIA:  General endotracheal.   ANTIBIOTICS:  Ancef 1 gm.   DRAINS:  Red rubber catheter of the bladder.   COMPLICATIONS:  None.   ESTIMATED BLOOD LOSS:  Minimal.   SPECIMENS:  None.   FINDINGS:  The patient had a normal appearing liver, the gallbladder was not  seen, the stomach appeared to be normal. The appendix was normal. The  intestines were normal. In the anterior cul-de-sac, there was no evidence of  any endometriosis or adhesions.  The uterus had a 3-4 cm uterine fibroid in  the left cornual area, fallopian tubes and ovaries were normal bilaterally.  In the posterior cul-de-sac, the patient had a minimal amount of  endometriosis on the right uterosacral ligament at the base of the cervix.  There was also a peritoneal window on the right uterosacral ligament. The  left appeared to be normal. This is the only evidence of any endometriosis  seen in the pelvis. There were no adhesions in the pelvis.   DESCRIPTION OF PROCEDURE:  The patient was taken to the operative suites, a  general anesthetic was administered without complications. She was then  placed in the dorsal lithotomy position and prepped with Hibiclens and her  bladder was drained with a red  rubber catheter. She was then draped in the  usual fashion for this procedure. Her umbilicus was injected with 0.25%  Marcaine, a vertical skin incision was made and the Veress needle was placed  in the peritoneal cavity and the peritoneal cavity was filled with 3 liters  of carbon dioxide. A 10 mm trocar was then placed in the peritoneal cavity  and scope placed. An examination of the abdominal and pelvic contents was  then undertaken. The patient did have a 5 mm port placed in the suprapubic  region, the findings were as listed above. At this point, the Kleppinger  forceps were set up and the area of endometriosis in the right uterosacral  ligament was cauterized and again a careful inspection of the pelvis  revealed no evidence of other endometriosis. At this point, the procedure  was concluded. The pneumoperitoneum was released, the instruments  removed,  the fascia was closed with interrupted #0 Vicryl suture, the skin closed  with interrupted 4-0 Vicryl suture. The patient tolerated the procedure well  and was taken to the recovery room in stable condition and she will be  discharged to home with Tylox.                                                Malva Limes, M.D.    MA/MEDQ  D:  01/29/2002  T:  01/30/2002  Job:  [id number]

## 2010-09-15 NOTE — Op Note (Signed)
NAME:  [name], [name]                      ACCOUNT NO.:  [id number]   MEDICAL RECORD NO.:  [id number]                   PATIENT TYPE:  AMB   LOCATION:  DAY                                  FACILITY:  [facility]   PHYSICIAN:  Dionne Ano. Everlene Other, M.D.         DATE OF BIRTH:  1960/08/12   DATE OF PROCEDURE:  12/01/2002  DATE OF DISCHARGE:                                 OPERATIVE REPORT   PREOPERATIVE DIAGNOSIS:  Adhesive capsulitis, left shoulder, with failure of  conservative management.   POSTOPERATIVE DIAGNOSIS:  Adhesive capsulitis, left shoulder, with failure  of conservative management.   OPERATION/PROCEDURE:  1. Closed manipulation, left shoulder, under anesthesia, with preoperative     interscalene block.  2. Lavage left glenohumeral joint with saline and placement of 10 mL of     Sensorcaine 0.25% which was  methylparaben free.  1. Examination under anesthesia, left shoulder.   SURGEON:  Dionne Ano. Amanda Pea, M.D.   ASSISTANT:  Karie Chimera, P.A.-C.   COMPLICATIONS:  None.   ANESTHESIA:  General with preoperative interscalene block.   ESTIMATED BLOOD LOSS:  Less than 10 mL.   DRAINS:  None.   INDICATIONS:  This patient is a very pleasant 50 year old female who  presents with left shoulder adhesive capsulitis.  Interestingly, she has had  a right shoulder adhesive capsulitis which ultimately required manipulation  under anesthesia which she recovered from nicely.  Unfortunately she has  developed a similar problem in the left shoulder and has failed conservative  management.  Has a remarkably stiff joint.  She has had a workup due to a  reaction to infiltration of the shoulder joint previously revealing reaction  to the Depo-Medrol and preservative-containing methylparaben.  The patient  understands risks and methods of the surgery and desires to proceed.   OPERATIVE FINDINGS:  The patient had significant adhesive capsulitis.  She  underwent manipulation which  restored her range of motion beautifully.  She  had a very large pop indicative of release of the adhesions.  I did not note  any obvious instability about the shoulder or iatrogenic fracture.  I was  pleased with the release and following this, lavaged the joint with saline  followed by infiltration of 10 mL of Sensorcaine 0.25% which was  methylparaben free.   DESCRIPTION OF PROCEDURE:  _____________ anesthesia, preoperative  interscalene block was placed and is in excellent working fashion.  Following this, the patient was taken to the operative suite and underwent a  smooth induction of general anesthesia.  I did examine preoperatively.  Following her preoperative examination and subsequent general anesthesia,  the patient was placed supine and underwent manipulation of the shoulder.  We began by manipulating the shoulder into forward flexion. Once this was  done, we placed the patient in 90 degrees of abduction.  In forward flexion,  a very large release of capsular adhesions occurred.  Audible popping was  noted with release of adhesions  and no instability about the humeral shaft  or head was palpable.  Once this was done, the patient was gently  manipulated into abduction.  All manipulations were done smoothly and  without any firm tension or force.  All force was gradual to allow for  gentle release of the adhesions.  They did release nicely.  Once she was in  abduction we then placed her in external rotation to release adhesions  followed by internal rotation.  I was able to achieve excellent correction  comparable to her opposite side which has full range of motion.  Following  this, I placed the arm in neutral abduction and performed internal and  external rotation maneuvers as well.  Following this, I then placed the arm  across her chest, releasing the posterior capsular adhesions.  Once this was  done, I repeated the same sequence of maneuvers with short-arm lever as I  did  the first time. Following this I then compared the right and left sides  and was very pleased with the motion.  Thus, at this point I then performed  lavage of the left glenohumeral joint.  The lavage was occurred without  difficulty.  I entered anteriorly and lavaged with saline to remove any  bloody fluid from the removal of the adhesions.  Following the lavage, with  18-gauge needles and a setup, I then infiltrated the shoulder with 10 mL of  Sensorcaine 0.25%, methylparaben free.  The patient tolerated this well and  I was pleased with the shoulder and examination under anesthesia revealed no  obvious iatrogenic fracture.  Motion was comparable on both sides and she  appeared to be stable.  Following this, the patient was then placed in a  sling and underwent transfer to the recovery room.   She will be monitored in the recovery room and discharged home as  appropriate once anesthesia avows.  She will be in therapy tomorrow.  Appropriate pain medicine in the form of Percocet as well as Robaxin for  muscle spasm, and Elavil for nighttime sleep were dispensed. She will notify  us if she has any problems, questions or concerns.   It has been a pleasure to see her and participate in her care.                                               Dionne Ano. Everlene Other, M.D.    Nash Mantis  D:  12/01/2002  T:  12/01/2002  Job:  [id number]   cc:   Rosalyn Gess. Norins, M.D. [facility] D. Young, M.D.  [address] [place]  [place] [address]  Fax: [phone]

## 2010-09-15 NOTE — Consult Note (Signed)
NAME:  [name], [name]            ACCOUNT NO.:  [id number]   MEDICAL RECORD NO.:  [id number]          PATIENT TYPE:  EMS   LOCATION:  ED                           FACILITY:  [facility]   PHYSICIAN:  Sharlet Salina T. Hoxworth, M.D.DATE OF BIRTH:  1960/08/20   DATE OF CONSULTATION:  07/04/2006  DATE OF DISCHARGE:  07/04/2006                                 CONSULTATION   CHIEF COMPLAINT:  Right lower quadrant pain.   HISTORY OF PRESENT ILLNESS:  I was asked by Dr. Debby Bud to evaluate Ms.  Sexton.  She is a very pleasant 50 year old female.  She was in her  usual state of good health until about 3-1/2 days ago when she noted the  sudden onset of sharp right lower quadrant pain.  This was most severe  at its initial presentation.  It improved slightly but has been  persistent and constant over the last 3 days.  It waxes and wanes in  intensity.  It is somewhat worse with motion and better with rest.  She  has had lack of appetite during the pain and then today has had nausea  without vomiting.  She has felt chilled at one point but has not had any  documented fever.  She does have a history of ruptured ovarian cyst  which she stated was similar but this pain has been more persistent and  she had not previously had nausea with her ovarian cyst pain.  Her bowel  movements have been normal to slightly constipated without diarrhea,  melena, hematochezia.  Again no vomiting.  No urinary burning, frequency  or blood.   PAST MEDICAL HISTORY:  Surgery includes laparoscopy in 1982 for  endometriosis and benign breast biopsy.  She has had manipulation of  right frozen shoulder on 2 occasions.  She has a history of possible  enlargement or aneurysm of the ascending aorta which has been followed  and stable.   CURRENT MEDICATIONS:  Progesterone cream daily.   ALLERGIES:  DEPO-MEDROL and SYNTHETIC PREDNISONE.   SOCIAL HISTORY:  She is married.  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  Her husband is a PA  with the radiology group.  Does not smoke cigarettes  or drink alcohol.   FAMILY HISTORY:  Positive for stroke, hypertension.   REVIEW OF SYSTEMS:  GENERAL:  Positive for some fatigue and lack of  appetite during this illness.  RESPIRATORY:  No shortness breath, cough,  wheezing.  CARDIAC:  No chest pain, palpitations.  History of heart  disease.  ABDOMEN:  GI as above.  GU:  As above.   PHYSICAL EXAM:  She is afebrile.  Vital signs within normal limits.  GENERAL:  She is a thin, healthy-appearing female in no acute distress.  SKIN:  Warm and dry.  No rash or infection.  HEENT:  No palpable mass or thyromegaly.  Sclerae nonicteric.  LUNGS:  Clear without wheezing or increased work of breathing.  CARDIAC:  Regular rate and rhythm.  No murmurs, no edema.  ABDOMEN:  Nondistended.  Bowel sounds are normoactive.  There is  tenderness in the right lower quadrant.  It is moderate  with some  guarding but no peritoneal signs.  No rebound, no masses.  The remainder  of the abdomen is soft and nontender.   LABORATORY:  Lab at Dr. Debby Bud' office includes white count of 8.6 with  a normal differential normal, normal BMET.  CT scan of the abdomen and  pelvis was obtained which I reviewed with the radiologist.  This shows a  normal appendix.  There is evidence of ruptured ovarian cyst with some  fluid in the right lower quadrant and remnants of the cyst noted.  Also  noted uterine fibroids.   ASSESSMENT/PLAN:  Acute right lower quadrant pain which clinically is  suspicious for ruptured ovarian cyst and this confirmed on CT scan.  For  now, we will treat symptomatically with pain and nausea medication.  If  the pain worsens at all or just fails to resolve over the next couple of  days, she is will consult with her gynecologist and we will plan a  routine follow-up there, at a minimum at a regular appointment in May or  made available as needed.      Lorne Skeens. Hoxworth, M.D.  Electronically  Signed     BTH/MEDQ  D:  07/04/2006  T:  07/05/2006  Job:  [id number]   cc:   Rosalyn Gess. Norins, MD  [address]  [place]  [place] [address]

## 2010-09-15 NOTE — Assessment & Plan Note (Signed)
[facility]                           PRIMARY CARE OFFICE NOTE   NAME:[name], [name]                   MRN:          [id number]  DATE:07/04/2006                            DOB:          23-Jan-1961    [name] is very well known to me. Her past medical history is well  documented in recent notes.   Patient reports that she has had abdominal discomfort since Monday. This  began as some mild discomfort in the umbilical region which has over the  last 2 days migrated down to the right lower quadrant. She has been  thinking this was just gas or some mild discomfort but has continued to  be bothersome and increasing in intensity in regards to her pain. She  has had loss of appetite although she has been able to keep down food.  She has had some nausea. She has had no documented fevers above 99 but  has felt very chilly and had chills but not rigors. She has had normal  bowel habit.   Patient reports she has had ovarian cyst ruptures in the past and this  feels very different with the pain being so localized. She has had no  pain in her flanks, no trouble with urination.   CURRENT MEDICATIONS:  1. Fish oil.  2. Vitamin C.  3. Folic acid.  4. Progesterone cream.  5. Glucosamine.  6. Tumeric.  7. __________ .  8. MSM.  9. Boswellia.  10.Vitamin D.  11.B complex.  12.Magnesium.  13.Garlic.   REVIEW OF SYSTEMS:  Negative except for the HPI.   PHYSICAL EXAMINATION:  Temperature 98.7, blood pressure 108/69, pulse  was 81.  GENERAL APPEARANCE: This is a slender woman who is uncomfortable, unable  to find a comfortable position, except best lying down.  ABDOMEN: Patient has hypoactive bowel sounds. There is no  organosplenomegaly. Patient had marked tenderness at the right lower  quadrant and McBurney's point. She had referal of pain to the right side  when palpation was done on the left side. She had no peritoneal signs  with no guarding  or rebound. She had a negative psoas sign.  RECTAL EXAM: Deferred.   Laboratory, white count was 8,600 with normal differential, chemistries  were unremarkable with a potassium of 4.9, normal calcium.   ASSESSMENT/PLAN:  Right lower quadrant abdominal pain very suspicious  clinically for appendicitis.   PLAN:  Patient is to see Dr. Jaclynn Guarneri at the [facility]  Department for surgical evaluation. She is scheduled for a CT scan of  the abdomen and pelvis for July 05, 2006 at 9:20 a.m. at [address] [facility].   patient seen at [facility] by Dr. Johna Sheriff. CT revealed normal appendix,  ruptured ovarian cyst.     Rosalyn Gess. Norins, MD  Electronically Signed    MEN/MedQ  DD: 07/04/2006  DT: 07/04/2006  Job #: [id number]

## 2010-09-15 NOTE — Op Note (Signed)
[facility]  Patient:    [name], [name]                   MRN: [id number] Proc. Date: 05/09/00 Adm. Date:  [id number] Attending:  Dominica Severin CC:         Rosalyn Gess. Norins, M.D. [facility]   Operative Report  DATE OF BIRTH:  13-Jan-1961  PREOPERATIVE DIAGNOSIS:  Adhesive capsulitis, right shoulder.  POSTOPERATIVE DIAGNOSIS:  Adhesive capsulitis, right shoulder.  OPERATION:  Manipulation under anesthesia right shoulder with right shoulder joint injection in to the glenohumeral joint.  SURGEON:  Aron Baba, M.D.  ASSISTANT:  None.  COMPLICATIONS:  None.  ANESTHESIA:  Interscalene block preoperatively followed by general anesthetic in the operative suite.  INDICATIONS:  Ms. [name] is a 50 year old white female who presents with the above-mentioned diagnosis.  She has maximized conservative measures in the form of physical therapy, pharmacologic management, and other measures in terms of trying to maximize her function in the face of severe adhesive capsulitis.  She has made some gains but has plateued.  I discussed with her manipulation under anesthesia now greater than six months into her disease process.  She desires to proceed and understands the risks and benefits of fracture, failure of surgery to accomplish its intended goals of relieving symptoms, restoring joint function, infection, bleeding, etc.  She desires to proceed.  All questions have been encouraged and answered.  OPERATIVE FINDINGS:  The patient had adhesive capsulitis.  She was manipulated gently under anesthetic without complication.  I was able to achieve full abduction and flexion, external rotation to 80 degrees, and full internal rotation.  There were no complications.  I did fluoroscope the shoulder intraoperatively.  DESCRIPTION OF PROCEDURE IN DETAIL:  The patient was seen by myself in anesthesia preoperatively.  She was given an interscalene  block by Dr. Smitty Cords ______ .  The patient tolerated this well.  This was noted to be in excellent working fashion.  She was then taken to the operating suite and underwent a smooth induction of general anesthetic.  She then underwent a general shoulder manipulation.  Using short lever arm traction and manipulation, I manipulated the shoulder with flexion followed by abduction, followed by external and internal rotation.  Her internal rotation was not particularly abnormal.  The patient had a very loud and audible pop x 2.  Smaller adhesions were also felt.  Thwere ere atherogenic fractures noted in the operative suite under fluoroscopy.  She tolerated this well.  Following this, the patient had lavage of the joint with copious amounts of LR.  There was bloody fluid in the joint from the manipulation.  After the lavage, the patient then underwent placement of Depo-Medrol and a Marcaine/lidocaine mixture into the shoulder joint.  She tolerated this without difficulty.  There were no complications.  Once this was done, she was awakened from general anesthesia and transferred to the recovery room in stable condition.  All sponge and instrument counts were reported as correct. There were no complications.  I look forward to participating in her postoperative care.  It has been a pleasure to treat her.  She will go home today from the hospital and begin physical therapy immediately tomorrow which has already been set up. DD:  05/09/00 TD:  05/10/00 Job: [id number]

## 2010-09-15 NOTE — Assessment & Plan Note (Signed)
[facility]                           PRIMARY CARE OFFICE NOTE   NAME:Artiaga, ERCELLE WINKLES                   MRN:          [id number]  DATE:04/16/2006                            DOB:          Apr 12, 1961    Ms. Leveille called, left me a message, and I called her back.  She  reports she has had a several-day history of cold with diffuse myalgias,  worse on the third.  Today, she started running a fever to 100, with a  normal body temperature of about 96.7.  She complains of chest  congestion and a cough that is now productive of a colored thick sputum.  She has been taking Echinacea, airborne, Pro-biotics and fluids.  She  did try Delsym cough syrup with a little relief from her cough, which is  persistent and keeps her up through the night.   The patient was speaking whole sentences, did not appear to be  significantly short of breath.  We discussed the options of this being  potentially influenza versus a bronchitis.  Given her symptoms have been  ongoing for greater than 48 hours, I doubt that Tamiflu would be  particularly helpful.   Will start Phenergan with codeine, 1 teaspoon q.6 h. for cough and  azithromycin 500 mg q. day x3 days for bacterial coverage.   Patient is instructed to call me, if her symptoms do not improve.     Rosalyn Gess Norins, MD  Electronically Signed    MEN/MedQ  DD: 06/03/2006  DT: 06/03/2006  Job #: [id number]

## 2010-09-15 NOTE — Assessment & Plan Note (Signed)
[facility]                           PRIMARY CARE OFFICE NOTE   NAME:[name]                   MRN:          [id number]  DATE:04/16/2006                            DOB:          10-01-1960    [name] is well-known to me.  She is a delightful 50 year old woman  who presents for followup evaluation and exam.   CHIEF COMPLAINT:  1. The patient reports prominent heart sounds in her left ear, which      is a new event.  She does hear it sometimes in her right ear but      this is mostly a tinnitus-type sound in the right.  She has had no      sinus congestion particularly, but admits that this may be part of      her problem.  2. The patient reports a sharp tingling discomfort in the posterior      buttock and thigh that occurs after running or other stressing-type      activity.  3. Deep sacral aching discomfort which has been intermittent for a      long period of time but now is more frequent.  It had been      menstrual related.  She does have a history of endometriosis.  4. Shoulder pain.  The patient has a history of restrictive adhesive      capsulitis in both shoulders requiring surgical intervention.  She      has been active and continues to exercise.  She will periodically      have a sharp pain in her right shoulder which she feels is an      impingement-type discomfort.  It will take her sometimes up to 3      hours to adequately relax and feel this pop back into place.  She      reports she has seen Dr. Amanda Pea about this, who feels there is no      structural abnormality that requires any orthopedic intervention.  5. Cardiovascular.  The patient had a very large workup for possible      enlargement/aneurysm of the ascending aorta.  This was initially      discovered on an MRI of the breast.  She subsequently has had      followup CT angio as well as MR angio.  She has had consultation      with Dr. Lilian Kapur at [facility] and a thorough      evaluation and workup for Marfan's syndrome including being seen in      the Division of Medical Genetics.  The final evaluation was that      she did not have Marfan's or Ehlers-Danlos syndrome.  The patient      has been seen by Dr. Samule Ohm for cardiology who thought she had an      aneurysm of the ascending aorta with a maximum diameter of 3.6 cm.      He has advised her to restrict her activities to avoid increased      intrathoracic pressure and stress, as well  as careful control of      her blood pressure with a goal of systolic of 100 or less.   PAST MEDICAL HISTORY:  Surgical:  1. Wisdom teeth extracted.  2. Fibroadenoma excised from her breast in 1980.  3. Laparoscopy in 1982 for endometriosis.  4. Excision of a basal cell carcinoma inferior aspect of the lower      eyelid.  5. Surgical manipulation for adhesive capsulitis right shoulder 2002.  6. Surgical manipulation for adhesive capsulitis left shoulder 2004.   Medical illnesses:  1. Usual childhood disease.  2. Juvenile acne.  3. Adhesive capsulitis.  4. Basal cell carcinoma.  5. Ascending dilation of the aorta.  6. Neck discomfort with pins-and-needles paresthesia in the scapula      and neck, worse on the left.   For the latter problem and several of her problems, the patient has had  very extensive evaluations.  She has been tested for Lyme which was  negative, antinuclear antibody was negative, rheumatoid factor that was  negative, random cortisol in the past that was negative, HLA-B27 which  was positive.  She has had MRI of the cervical spine that showed mild  degenerative disease, mild disc disease but none that would require  surgery intervention.  She has had mainstem auditory evoked responses  which were unremarkable.  She has been screened for heavy metals, which  has been unremarkable.   The patient has had thorough evaluation for breast lesions with the  last  MRI of the breast November 05, 2005, which showed stable nodular parenchymal-  type enhancement pattern with a nodule on the right lateral breast.  She  was advised to have a followup ultrasound in 6 months for this lesion.  The patient has had CT angio which did reveal a dilation of the  ascending aorta just above the root with a uniform wall of the aorta.  She also has had MRI similarly of the vascular tree which revealed mild  dilatation in a uniform, nonbulging fashion of the ascending aorta.  These films were actually reviewed with the patient, the image cast, at  today's visit.   FAMILY HISTORY:  Father has a stroke.  Father has hypertension.  Father  has hyperlipidemia.  Father has ankylosing spondylitis.  Mother with  history of seizure disorder, migraines, and trigeminal neuralgia.  There  is a secondary relative with Ehlers-Danlos syndrome.   REVIEW OF SYSTEMS:  The patient has had no fevers or chills.  Her weight  has been stable.  She sleeps well.  She has had no ophthalmologic  problems and is current with ophthalmology evaluation.  The patient with  tinnitus as noted above but no significant true hearing loss.  She has  had no vertigo.  The patient has had no palpitations, chest pain, or  discomfort, or limitation in activity.  No respiratory difficulty.  GI  review is negative.  GU review is negative and the patient is current  with her gynecologist, Dr. Dareen Piano.  Musculoskeletal per the HPI.  No  dermatologic complaints.  Neurologic per the HPI.   DRUG ALLERGIES:  1. DEPO-MEDROL.  2. SYNTHETIC PREDNISONE.  3. PRESERVATIVES IN INJECTABLE MEDICATION.  4. HYMENOPTERA.  5. PINEAPPLE.   CURRENT MEDICATIONS:  1. Fish oil 2 g daily.  2. Vitamin C 2 g daily.  3. Folic acid 400 mg daily.  4. Progesterone cream daily.  5. Glucosamine 1000 mg daily.  6. Tumeric.  7. Ginger.  8. MSM.  9.  Boswellia.  10.Vitamin D and B complex.  11.Allegra. 12.naturopathic and other  herbals and naturoceuticals.   SOCIAL HISTORY:  The patient's marriage is in good health.  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  She remains very active in her community.   PHYSICAL EXAMINATION:  VITAL SIGNS:  Temperature was 97.5, blood  pressure 113/65, pulse 73, weight 128.  GENERAL APPEARANCE:  This is a slender, well-groomed woman in no acute  distress.  HEENT:  Normocephalic, atraumatic.  EACs and TMs were normal.  Oropharynx with native dentition in good repair.  No buccal or palatal  lesions were noted.  Posterior pharynx was clear.  Conjunctivae and  sclerae were clear.  PERRLA, EOMI.  Funduscopic exam was unremarkable.  NECK:  Supple without thyromegaly.  NODES:  No adenopathy was noted in the cervix or supraclavicular region.  CHEST:  No CVA tenderness.  LUNGS:  Clear to auscultation and percussion.  CARDIOVASCULAR:  Shows 2+ radial pulse, no JVD, no carotid bruits.  She  had a quiet precordium with no heave, no thrill.  She had a regular rate  and rhythm without murmurs, rubs, or gallops.  ABDOMEN:  Soft, no guarding or rebound.  BREAST:  Exam was deferred to gynecology.  PELVIC AND RECTAL:  Exams deferred to gynecology.  EXTREMITIES:  The patient has normal joints including all the small  joints of hands and feet, wrists, ankles.  Knees were normal with full  range of motion with no effusion.  The patient has good range of motion  of both her shoulders.  There was no click, no crepitus.  NEUROLOGIC:  Nonfocal.   DATA BASE:  Hemoglobin was stable at 15.3 g, white count was stable at  9.9 with a normal differential.  Cholesterol is 190, triglycerides 55,  HDL 51.8, LDL 127.  Chemistries were unremarkable with a glucose of 101.  Electrolytes were normal.  Kidney function normal with a creatinine of  0.8 and a GFR of 82 mL per minute.  Liver functions were normal.  TSH  was normal at 1.55.  Urinalysis  positive for blood but the patient was  near her menstrual cycle.  CCP was negative at less than 20.   ASSESSMENT AND PLAN:  1. Tinnitus.  Suspect the patient may have some sinus congestion that      is causing her to be more aware of her heart rate.  I would      recommend that she use low-dose pseudoephedrine at 30 mg t.i.d. as      needed.  2. Musculoskeletal.  The patient's sharp, tingling discomfort in her      posterior thighs and buttock with stretching exercise may represent      mild sciatica that is exacerbated by movement and activity.  The patient has what sounds like a mild shoulder impingement syndrome  that is intermittent, but she generally has well-preserved full range of  motion.  Deep sacral aching and pain is very suggestive of possible endometriosis  discomfort, which has changed in its duration and timing as the patient  approaches the climacteric with changing hormonal levels, particularly FSH and LH, with endometrial stimulation.  Plan:  The patient is advised to use diazepam 2 mg tablets, titrating up  to a maximum of 10 mg for shoulder impingement discomfort and need for  relaxation and relocation to normal positioning.  The patient is  referred to [facility]  for physical therapy to help work with  both her shoulder as well as her low back and sciatica-type discomfort.  1. Rheumatology.  The patient is negative for rheumatoid factor in the      past.  She is negative for CCP at this time.  There is no evidence      of examination of any rheumatoid arthritic type flare with no      synovial thickening, no erythema about any joint, heat or      discomfort.  Suspect the patient has osteoarthritis of a diffuse      nature.  Plan:  Warm soaks as appropriate, APAP for discomfort, low-      dose nonsteroidal anti-inflammatory medication for discomfort.  2. Cardiovascular.  Discussed this at length with the patient      including a full review of her  imaging studies.  The aorta appears      to be uniform in caliber with no particular vessel wall bulging or      lesion.  She has been ruled out for Marfan syndrome and other      abnormalities of tissue structure which would suggest there is no      inherent weakness of the vessel wall.  The patient has had a stable      sizing of her aorta.  I explained to her that I do not believe she      has a true aneurysm, but rather an enlargement of the vessel.  Plan:  The patient is to have followup MR angio to assess whether there  has been any change in size of the ascending aorta.  I have advised the  patient to discuss with Dr. Samule Ohm or Dr. Lilian Kapur whether she truly  needs to be restricting her cardio exercise.  I have opined that good  cardiovascular conditioning and fitness is to her advantage, even if she  should at some point require any surgical intervention.  Furthermore, I  have advised that I do not see how her regular exercise routine that  involves upper body work would jeopardize her vascular system in regards  to increased pressures or stress.  I have directed her to Dr. Samule Ohm to  discuss these issues and obtain fuller information from him as needed.  In regards to blood pressure management, the patient is at 113/65.  She  finds that if she has a lower blood pressure she will, in fact, feel  bad.  I believe this blood pressure is adequate for management of her  dilation of her ascending aorta, but again asked her to contact Dr.  Samule Ohm for questions and clarification.  Plan:  The patient to be scheduled for MR angio.  1. Health maintenance.  The patient is up-to-date with her      gynecologist.  She definitely is up-to-date for breast screening      and will be scheduling an ultrasound as followup as instructed.      The patient will be a candidate for colorectal cancer screening at      age 9.   In summary, this is a very pleasant woman with problems outlined above who  actually does seem to be doing well and quite stable.  She is asked  to return to see me on a p.r.n. basis.     Rosalyn Gess Norins, MD  Electronically Signed    MEN/MedQ  DD: 04/17/2006  DT: 04/17/2006  Job #: [id number]   cc:   Sol Passer  Samule Ohm, MD  Lyndon Code, M.D.  Dionne Ano. Everlene Other, M.D.  Malva Limes, M.D.  Scarlette Calico Delaughter

## 2010-09-20 ENCOUNTER — Encounter (INDEPENDENT_AMBULATORY_CARE_PROVIDER_SITE_OTHER): Payer: Self-pay | Admitting: Surgery

## 2010-11-02 ENCOUNTER — Other Ambulatory Visit (INDEPENDENT_AMBULATORY_CARE_PROVIDER_SITE_OTHER): Payer: PRIVATE HEALTH INSURANCE

## 2010-11-02 ENCOUNTER — Other Ambulatory Visit: Payer: Self-pay | Admitting: Internal Medicine

## 2010-11-02 DIAGNOSIS — T887XXA Unspecified adverse effect of drug or medicament, initial encounter: Secondary | ICD-10-CM

## 2010-11-02 LAB — LIPID PANEL
Cholesterol: 233 mg/dL — ABNORMAL HIGH (ref 0–200)
HDL: 60.6 mg/dL (ref >39.00)
Triglycerides: 133 mg/dL (ref 0.0–149.0)

## 2010-11-05 ENCOUNTER — Encounter: Payer: Self-pay | Admitting: Internal Medicine

## 2010-11-06 ENCOUNTER — Other Ambulatory Visit: Payer: Self-pay | Admitting: Oncology

## 2010-11-06 ENCOUNTER — Encounter (HOSPITAL_BASED_OUTPATIENT_CLINIC_OR_DEPARTMENT_OTHER): Payer: PRIVATE HEALTH INSURANCE | Admitting: Oncology

## 2010-11-06 DIAGNOSIS — C50919 Malignant neoplasm of unspecified site of unspecified female breast: Secondary | ICD-10-CM

## 2010-11-06 DIAGNOSIS — D259 Leiomyoma of uterus, unspecified: Secondary | ICD-10-CM

## 2010-11-06 DIAGNOSIS — C50019 Malignant neoplasm of nipple and areola, unspecified female breast: Secondary | ICD-10-CM

## 2010-11-06 DIAGNOSIS — Z170 Estrogen receptor positive status [ER+]: Secondary | ICD-10-CM

## 2010-11-06 LAB — CBC WITH DIFFERENTIAL/PLATELET
Basophils Absolute: 0.1 10*3/uL (ref 0.0–0.1)
EOS%: 2.4 % (ref 0.0–7.0)
HCT: 40.4 % (ref 34.8–46.6)
HGB: 13.8 g/dL (ref 11.6–15.9)
LYMPH%: 25.8 % (ref 14.0–49.7)
MCH: 31.3 pg (ref 25.1–34.0)
MCHC: 34.2 g/dL (ref 31.5–36.0)
MCV: 91.5 fL (ref 79.5–101.0)
MONO%: 8.2 % (ref 0.0–14.0)
NEUT%: 62.1 % (ref 38.4–76.8)

## 2010-11-06 LAB — COMPREHENSIVE METABOLIC PANEL
AST: 26 U/L (ref 0–37)
Alkaline Phosphatase: 68 U/L (ref 39–117)
BUN: 16 mg/dL (ref 6–23)
Calcium: 9.6 mg/dL (ref 8.4–10.5)
Creatinine, Ser: 0.77 mg/dL (ref 0.50–1.10)
Total Bilirubin: 0.2 mg/dL — ABNORMAL LOW (ref 0.3–1.2)

## 2010-11-07 LAB — CANCER ANTIGEN 27.29: CA 27.29: 38 U/mL (ref 0–39)

## 2010-11-07 LAB — VITAMIN D 25 HYDROXY (VIT D DEFICIENCY, FRACTURES): Vit D, 25-Hydroxy: 49 ng/mL (ref 30–89)

## 2010-11-24 ENCOUNTER — Encounter: Payer: Self-pay | Admitting: Internal Medicine

## 2010-11-27 ENCOUNTER — Ambulatory Visit (INDEPENDENT_AMBULATORY_CARE_PROVIDER_SITE_OTHER): Payer: PRIVATE HEALTH INSURANCE | Admitting: Internal Medicine

## 2010-11-27 VITALS — BP 120/80 | HR 83 | Temp 98.3°F | Wt 135.0 lb

## 2010-11-27 DIAGNOSIS — R7989 Other specified abnormal findings of blood chemistry: Secondary | ICD-10-CM

## 2010-11-27 DIAGNOSIS — F418 Other specified anxiety disorders: Secondary | ICD-10-CM

## 2010-11-27 DIAGNOSIS — F341 Dysthymic disorder: Secondary | ICD-10-CM

## 2010-11-27 DIAGNOSIS — R51 Headache: Secondary | ICD-10-CM

## 2010-11-27 DIAGNOSIS — E7800 Pure hypercholesterolemia, unspecified: Secondary | ICD-10-CM

## 2010-11-27 NOTE — Progress Notes (Signed)
  Subjective:    Patient ID: [name], female    DOB: 11/26/1960, 50 y.o.   MRN: [id number]  HPI [name] presents with a recent LDL 176 up from 127 in Dec '07. No change in diet, vigorously exercising. She was on Femara which does list hypercholesterolemia as a side effect.   She has had persistent right hemicrania headache which is periorbital and did not respond to antihistamines, decongestant, saline wash, colloidal silver wash to no avail. No visual change of significance: has occasional floaters but no visual field cuts. MRI/MRA brain in October of '09 - both studies negative (done for pulsatile tinnitis).   Having some feelings of being over stressed by rigorous exercise/aerobic. Non-specific with no chest pain.   Having some mild reflux but does not want to take PPI  Having occasional depressed mood - mild which lasts 1/2 a day and occurs every 3+ days or less. She is on effexor 37.5 for hot flashes.    I have reviewed the patient's medical history in detail and updated the computerized patient record.    Review of Systems Review of Systems  Constitutional:  Negative for fever, chills, activity change and unexpected weight change.  HEENT:  Negative for hearing loss, ear pain, congestion, neck stiffness and postnasal drip. Negative for sore throat or swallowing problems. Negative for dental complaints.   Eyes: Negative for vision loss or change in visual acuity.  Respiratory: Negative for chest tightness and wheezing.   Cardiovascular: Negative for chest pain and palpitation. No decreased exercise tolerance Gastrointestinal: No change in bowel habit. No bloating or gas. Mild intermittent reflux and indigestion Genitourinary: Negative for urgency, frequency, flank pain and difficulty urinating.  Musculoskeletal: Negative for myalgias, back pain, arthralgias and gait problem. Mild neck pain Neurological: Negative for dizziness, tremors, weakness and headaches.  Hematological:  Negative for adenopathy.  Psychiatric/Behavioral: Negative for behavioral problems and dysphoric mood.       Objective:   Physical Exam Vitals noted - stable Gen'l - WNWD white woman in no distress HEENT - C&S clear, mild tenderness to percussion over the maxillary and frontal sinus. Decreased transillumination maxillary sinus bilaterally Neck - supple with full ROM Respiratory - normal respirations Cor - 2 +  Pulses, regular.       Assessment & Plan:   No problem-specific assessment & plan notes found for this encounter.

## 2010-11-28 DIAGNOSIS — E785 Hyperlipidemia, unspecified: Secondary | ICD-10-CM | POA: Insufficient documentation

## 2010-11-28 DIAGNOSIS — F419 Anxiety disorder, unspecified: Secondary | ICD-10-CM | POA: Insufficient documentation

## 2010-11-28 NOTE — Assessment & Plan Note (Signed)
Recent lab revealed an elevated total cholesterol, a stable HDL cholesterol and and elevated LDL cholesteorl at 176. She has a healthy diet, exercises on a regular basis and none of this has changed since '07. She was on Femara which can be lipogenic.  Plan - discussed risks and options: she will continue health life-style; will not start any medication.           Repeat lipid panel in 3 months. Recommendations to follow.

## 2010-11-28 NOTE — Assessment & Plan Note (Signed)
Persistent headache surrounding the right eye and involving the right face. Exam did not reveal any significant TMJ symptoms right. There is sinus tenderness. Transillumination was not diagnostic but suggestive of hypoplastic maxillary sinus bilaterally. No evidence of acute infection. Concern for chronic sinus pressure possible due to structural problems or intra-sinus growth, i.e. Polyp  Plan - CT limited to sinus.

## 2010-11-28 NOTE — Assessment & Plan Note (Signed)
[name] reports occasional "blue"days that are never severe with symptoms usually lasting less than half a day. She does not have vegative signs of depression: anhedonia, worry, chronic tearfulness, irritability.  Plan - continue low dose effexor used for climacteric symptoms.

## 2010-12-04 ENCOUNTER — Ambulatory Visit
Admission: RE | Admit: 2010-12-04 | Discharge: 2010-12-04 | Disposition: A | Discharge status: Home / Self Care | Payer: PRIVATE HEALTH INSURANCE | Source: Ambulatory Visit | Attending: Internal Medicine | Admitting: Internal Medicine

## 2010-12-05 ENCOUNTER — Telehealth: Payer: Self-pay | Admitting: Internal Medicine

## 2010-12-05 NOTE — Telephone Encounter (Signed)
Please call [name] - CT sinuses was totally negative. Continue with symptomatic treatment

## 2010-12-05 NOTE — Telephone Encounter (Signed)
Informed pt of results.

## 2011-01-17 ENCOUNTER — Telehealth: Payer: Self-pay | Admitting: *Deleted

## 2011-01-17 MED ORDER — ALPRAZOLAM 0.25 MG PO TBDP: 0.2500 mg | ORAL_TABLET | Freq: Four times a day (QID) | ORAL | Status: DC

## 2011-01-17 NOTE — Telephone Encounter (Signed)
Refill request for Alprazolam 0.25 mg tablet SIG take 1 tablet by mouth every 4 hours prn as needed for Nervousness QTY 30. Last filled 06/27/2010 Last OV 11/27/2010. Please Advise refills in Dr Debby Bud absence

## 2011-01-17 NOTE — Telephone Encounter (Signed)
yes

## 2011-02-06 LAB — POCT HEMOGLOBIN-HEMACUE
Hemoglobin: 14.7
Operator id: 118191

## 2011-02-06 LAB — POCT PREGNANCY, URINE
Operator id: 118191
Preg Test, Ur: NEGATIVE

## 2011-02-07 LAB — URINALYSIS, ROUTINE W REFLEX MICROSCOPIC
Bilirubin Urine: NEGATIVE
Nitrite: NEGATIVE
Specific Gravity, Urine: 1.015
Urobilinogen, UA: 0.2
pH: 6

## 2011-02-07 LAB — CBC
Platelets: 356
RBC: 4.79
WBC: 8.2

## 2011-02-07 LAB — DIFFERENTIAL
Basophils Absolute: 0
Basophils Relative: 0
Lymphocytes Relative: 26
Monocytes Absolute: 0.4
Monocytes Relative: 5
Neutro Abs: 5.5
Neutrophils Relative %: 67

## 2011-02-07 LAB — COMPREHENSIVE METABOLIC PANEL
ALT: 16
AST: 24
Albumin: 4.2
Alkaline Phosphatase: 47
CO2: 28
Chloride: 104
Creatinine, Ser: 0.78
GFR calc Af Amer: >60
GFR calc non Af Amer: >60
Potassium: 4.5
Sodium: 138
Total Bilirubin: 0.8

## 2011-02-20 ENCOUNTER — Encounter (HOSPITAL_BASED_OUTPATIENT_CLINIC_OR_DEPARTMENT_OTHER): Payer: PRIVATE HEALTH INSURANCE | Admitting: Oncology

## 2011-02-20 ENCOUNTER — Other Ambulatory Visit: Payer: Self-pay | Admitting: Oncology

## 2011-02-20 DIAGNOSIS — Z170 Estrogen receptor positive status [ER+]: Secondary | ICD-10-CM

## 2011-02-20 DIAGNOSIS — M25559 Pain in unspecified hip: Secondary | ICD-10-CM

## 2011-02-20 DIAGNOSIS — C50919 Malignant neoplasm of unspecified site of unspecified female breast: Secondary | ICD-10-CM

## 2011-02-20 DIAGNOSIS — R102 Pelvic and perineal pain: Secondary | ICD-10-CM

## 2011-02-26 ENCOUNTER — Ambulatory Visit
Admission: RE | Admit: 2011-02-26 | Discharge: 2011-02-26 | Disposition: A | Discharge status: Home / Self Care | Payer: PRIVATE HEALTH INSURANCE | Source: Ambulatory Visit | Attending: Oncology | Admitting: Oncology

## 2011-02-26 DIAGNOSIS — R102 Pelvic and perineal pain: Secondary | ICD-10-CM

## 2011-02-26 MED ORDER — GADOBENATE DIMEGLUMINE 529 MG/ML IV SOLN
12.0000 mL | Freq: Once | INTRAVENOUS | Status: AC | PRN
  Administered 2011-02-26: 12 mL via INTRAVENOUS

## 2011-03-02 ENCOUNTER — Ambulatory Visit (INDEPENDENT_AMBULATORY_CARE_PROVIDER_SITE_OTHER): Payer: PRIVATE HEALTH INSURANCE | Admitting: Surgery

## 2011-03-02 ENCOUNTER — Encounter (INDEPENDENT_AMBULATORY_CARE_PROVIDER_SITE_OTHER): Payer: Self-pay | Admitting: General Surgery

## 2011-03-02 ENCOUNTER — Encounter (INDEPENDENT_AMBULATORY_CARE_PROVIDER_SITE_OTHER): Payer: Self-pay | Admitting: Surgery

## 2011-03-02 VITALS — BP 100/60 | HR 80 | Temp 97.8°F | Resp 20 | Ht 67.0 in | Wt 134.0 lb

## 2011-03-02 DIAGNOSIS — Z853 Personal history of malignant neoplasm of breast: Secondary | ICD-10-CM

## 2011-03-02 NOTE — Progress Notes (Signed)
NAME: [name]       DOB: 1960-11-30           DATE: 03/02/2011       MRN: [id number]   [name] is a 50 y.o.Marland Kitchenfemale who presents for routine followup of her Right breast cancer diagnosed in 2008 and treated with Lumpectomy. She has no problems or concerns on either side.  PFSH: She has had no significant changes since the last visit here.  ROS: There have been no significant changes since the last visit here  EXAM: General: The patient is alert, oriented, generally healty appearing, NAD. Mood and affect are normal.  Breasts:  The right breast continues to show deformity with some retraction of the nipple and tethering inferiorly. However there is no dominant mass. There is no evidence of recurrence. Left breast is normal.  Lymphatics: She has no axillary or supraclavicular adenopathy on either side.  Extremities: Full ROM of the surgical side with no lymphedema noted.  Data Reviewed: Mammogram and MRI have been normal.  Impression: Doing well, with no evidence of recurrent cancer or new cancer  Plan: Will continue to follow up on an annual basis here. Mammogram and MRI have been normal.I suggested she see a plastic surgeon to see if some reconstruction can be done because of the deformity from her lumpectomy and radiation.

## 2011-03-02 NOTE — Patient Instructions (Signed)
I will see you again in a year. Consider seeing a plastic surgeon about some reconstruction:  Dr MVHQIO:[phone]   Dr Odis Luster - [phone]  Dr Sanger: [phone]

## 2011-04-26 ENCOUNTER — Ambulatory Visit: Payer: PRIVATE HEALTH INSURANCE

## 2011-04-26 ENCOUNTER — Ambulatory Visit (INDEPENDENT_AMBULATORY_CARE_PROVIDER_SITE_OTHER): Payer: PRIVATE HEALTH INSURANCE | Admitting: Internal Medicine

## 2011-04-26 DIAGNOSIS — E7800 Pure hypercholesterolemia, unspecified: Secondary | ICD-10-CM

## 2011-04-26 DIAGNOSIS — T887XXA Unspecified adverse effect of drug or medicament, initial encounter: Secondary | ICD-10-CM

## 2011-04-26 DIAGNOSIS — R0602 Shortness of breath: Secondary | ICD-10-CM

## 2011-04-26 DIAGNOSIS — R5381 Other malaise: Secondary | ICD-10-CM

## 2011-04-26 LAB — CBC WITH DIFFERENTIAL/PLATELET
Basophils Relative: 0.3 % (ref 0.0–3.0)
Eosinophils Relative: 2.7 % (ref 0.0–5.0)
Hemoglobin: 14.6 g/dL (ref 12.0–15.0)
Lymphocytes Relative: 29.1 % (ref 12.0–46.0)
MCHC: 34.3 g/dL (ref 30.0–36.0)
Monocytes Relative: 4.3 % (ref 3.0–12.0)
Neutro Abs: 4.8 10*3/uL (ref 1.4–7.7)
Neutrophils Relative %: 63.6 % (ref 43.0–77.0)
RBC: 4.74 Mil/uL (ref 3.87–5.11)
WBC: 7.6 10*3/uL (ref 4.5–10.5)

## 2011-04-26 MED ORDER — FLUTICASONE PROPIONATE 50 MCG/ACT NA SUSP: 1.0000 | Freq: Every day | NASAL | Status: DC

## 2011-04-27 ENCOUNTER — Encounter: Payer: Self-pay | Admitting: Internal Medicine

## 2011-04-27 LAB — LIPID PANEL
HDL: 51.3 mg/dL (ref >39.00)
Total CHOL/HDL Ratio: 4
Triglycerides: 258 mg/dL — ABNORMAL HIGH (ref 0.0–149.0)
VLDL: 51.6 mg/dL — ABNORMAL HIGH (ref 0.0–40.0)

## 2011-04-27 LAB — D-DIMER, QUANTITATIVE: D-Dimer, Quant: 0.49 ug/mL-FEU — ABNORMAL HIGH (ref 0.00–0.48)

## 2011-04-27 LAB — LDL CHOLESTEROL, DIRECT: Direct LDL: 110.1 mg/dL

## 2011-04-27 NOTE — Progress Notes (Signed)
Informed pt .

## 2011-04-29 ENCOUNTER — Encounter: Payer: Self-pay | Admitting: Internal Medicine

## 2011-04-29 NOTE — Assessment & Plan Note (Signed)
[name] c/o decreased energy, low endurance, DOE, bone ache. Labs reveal no anemia, no metabolic derangement. D-dimer normal making PE very unlikely.  Plan - discuss with Dr. Darnelle Catalan a drug holiday - 6 -8 weeks to see if symptoms of dyspnea, arthralgias, bone ache resolve.

## 2011-04-29 NOTE — Progress Notes (Signed)
Subjective:    Patient ID: [name], female    DOB: July 22, 1960, 50 y.o.   MRN: [id number]  HPI [name] presents with concerns about feeling bad. She reports that she feels short of breath, that her endurance has not increased as much as she would expect given how diligently she is exercising. She has pain she describes as in her bones. Her history is known to me and we reviewed her saga: lumpectomy, XRT, chemo then tamoxifen, aromatase inhibitors and now tamoxifen again. She is concerned that her symptoms are related to the drug(s). She has had no fever, no overt signs of infection or anemia, no symptoms to suggest metabolic derangement. She has had some soreness in her legs but no swelling. She has no history of any cardiac problems. She is unhappy about how she feels but does not admit to frank depression or having vegative signs of depression.  Past Medical History  Diagnosis Date  . Cancer 2008    Breast  . Congenital anomaly of aortic arch   . Unspecified hereditary and idiopathic peripheral neuropathy   . Labyrinthitis, unspecified   . Unspecified hearing loss   . Unspecified tinnitus   . Malignant neoplasm of breast (female), unspecified site   . Other B-complex deficiencies   . Other malaise and fatigue   . Adhesive capsulitis of shoulder    Past Surgical History  Procedure Date  . Breast lumpectomy Oct.29 & Mar 19, 2007    Right  . Breast fibroadenoma surgery 1981    Left  . Wisdom teeth extracted   . Laparoscopy 1982    for endometriosis  . Abdominal hysterectomy 04/2010    robotic  . Basal cell carcinoma excision 2003    rt eye area   Family History  Problem Relation Age of Onset  . Seizures Mother   . Migraines Mother   . Hypertension Father   . Other Father   . Hyperlipidemia Father   . Cancer Sister     breast  . Cancer Other     breast   History   Social History  . Marital Status: Married    Spouse Name: N/A    Number of Children: 0  . Years  of Education: N/A   Occupational History  . HAIR STYLIST   . vocalist   . model    Social History Main Topics  . Smoking status: Never Smoker   . Smokeless tobacco: Not on file  . Alcohol Use: Yes     wine with drinner a few nights a week  . Drug Use: No  . Sexually Active: Not on file   Other Topics Concern  . Not on file   Social History Narrative   Physician Roster:Onc- Dr Kristin Bruins- Dr Delanna Ahmadi [facility]- Dr Duane Boston- Dr Lilian Kapur ([facility]), [facility]       Review of Systems Constitutional:  Negative for fever, chills, activity change and unexpected weight change.  HEENT:  Negative for hearing loss, ear pain, congestion. Negative for sore throat or swallowing problems. Negative for dental complaints.   Eyes: Negative for vision loss or change in visual acuity.  Respiratory: Negative for chest tightness and wheezing. Negative for DOE.   Cardiovascular: Negative for chest pain or palpitations. Decreased exercise tolerance Gastrointestinal: No change in bowel habit. No bloating or gas. No reflux or indigestion Genitourinary: Negative for urgency, frequency, flank pain and difficulty urinating.  Musculoskeletal: Positive for myalgias, arthralgias.  Neurological: Negative for dizziness, tremors, weakness and headaches.  Hematological: Negative for adenopathy.  Psychiatric/Behavioral: Negative for behavioral problems and dysphoric mood.       Objective:   Physical Exam Vitals reviewed - stgable with normal O2 sat Gen'l - very pleasant well nourished, well groomed white woman in no distress. No pallor. HEENT- C&S clear, PERRLA Pulm - normal respirations, no wheezing, no increased WOB Cor - RRR Ext - no swelling or edema LE, no calve tenderness, negative Homan's sign. Neuro - A&O x 3, CN II-XII grossly intact, able to stand w/o assist, normal gait Skin - clear  Lab Results  Component Value Date   WBC 7.6 04/26/2011   HGB 14.6 04/26/2011   HCT 42.6  04/26/2011   PLT 298.0 04/26/2011   GLUCOSE 75 11/06/2010   CHOL 192 04/26/2011   TRIG 258.0* 04/26/2011   HDL 51.30 04/26/2011   LDLDIRECT 110.1 04/26/2011   LDLCALC 116* 03/09/2008   ALT 22 11/06/2010   AST 26 11/06/2010   NA 133* 11/06/2010   K 4.1 11/06/2010   CL 96 11/06/2010   CREATININE 0.77 11/06/2010   BUN 16 11/06/2010   CO2 34* 11/06/2010   TSH 1.340 06/28/2009   INR 1.02 03/19/2009       D-dimer                  0.49       Assessment & Plan:

## 2011-04-29 NOTE — Assessment & Plan Note (Signed)
LDL direct is better than goal of 130 or less. Triglycerides are above normal  Plan - continue health diet and life-style           No indication for medical therapy

## 2011-06-05 ENCOUNTER — Telehealth: Payer: Self-pay | Admitting: *Deleted

## 2011-06-05 NOTE — Telephone Encounter (Signed)
Called and  notified pt of future appts

## 2011-07-17 ENCOUNTER — Other Ambulatory Visit: Payer: Self-pay | Admitting: Oncology

## 2011-07-17 DIAGNOSIS — Z853 Personal history of malignant neoplasm of breast: Secondary | ICD-10-CM

## 2011-08-01 ENCOUNTER — Ambulatory Visit
Admission: RE | Admit: 2011-08-01 | Discharge: 2011-08-01 | Disposition: A | Discharge status: Home / Self Care | Payer: PRIVATE HEALTH INSURANCE | Source: Ambulatory Visit | Attending: Oncology | Admitting: Oncology

## 2011-08-01 DIAGNOSIS — Z853 Personal history of malignant neoplasm of breast: Secondary | ICD-10-CM

## 2011-08-14 ENCOUNTER — Other Ambulatory Visit: Payer: Self-pay | Admitting: *Deleted

## 2011-08-14 ENCOUNTER — Other Ambulatory Visit (HOSPITAL_BASED_OUTPATIENT_CLINIC_OR_DEPARTMENT_OTHER): Payer: PRIVATE HEALTH INSURANCE

## 2011-08-14 DIAGNOSIS — R309 Painful micturition, unspecified: Secondary | ICD-10-CM | POA: Insufficient documentation

## 2011-08-14 DIAGNOSIS — C50519 Malignant neoplasm of lower-outer quadrant of unspecified female breast: Secondary | ICD-10-CM

## 2011-08-14 LAB — URINALYSIS, MICROSCOPIC - CHCC
Ketones: NEGATIVE mg/dL
Nitrite: NEGATIVE
Protein: NEGATIVE mg/dL
RBC count: NEGATIVE (ref 0–2)
WBC, UA: NEGATIVE (ref 0–2)
pH: 6 (ref 4.6–8.0)

## 2011-08-14 LAB — CBC WITH DIFFERENTIAL/PLATELET
BASO%: 0.5 % (ref 0.0–2.0)
Eosinophils Absolute: 0.2 10*3/uL (ref 0.0–0.5)
HCT: 41.9 % (ref 34.8–46.6)
LYMPH%: 26.3 % (ref 14.0–49.7)
MCHC: 34.1 g/dL (ref 31.5–36.0)
MCV: 89.1 fL (ref 79.5–101.0)
MONO#: 0.4 10*3/uL (ref 0.1–0.9)
MONO%: 5.6 % (ref 0.0–14.0)
NEUT%: 65.3 % (ref 38.4–76.8)
Platelets: 271 10*3/uL (ref 145–400)
WBC: 6.6 10*3/uL (ref 3.9–10.3)

## 2011-08-14 LAB — COMPREHENSIVE METABOLIC PANEL
Alkaline Phosphatase: 61 U/L (ref 39–117)
CO2: 25 mEq/L (ref 19–32)
Creatinine, Ser: 0.74 mg/dL (ref 0.50–1.10)
Glucose, Bld: 77 mg/dL (ref 70–99)
Total Bilirubin: 0.3 mg/dL (ref 0.3–1.2)

## 2011-08-15 ENCOUNTER — Other Ambulatory Visit: Payer: Self-pay | Admitting: *Deleted

## 2011-08-15 NOTE — Telephone Encounter (Signed)
This RN spoke with pt per her complaints relating to " feeling like I have a UTI " U/A result was within normal values.  Per discussion [name] states onset of " pressure on bladder ", "low back-pelvic discomfort ", " increased urination ".  She felt mild feverish symptoms but denies actual fever or burning with urination.  [name] had yearly PAP approximately 1 month ago - exam normal.  This RN informed pt above will be reviewed with MD for recommendations.  Pt is on Tamoxifen and has had a total hysterectomy.

## 2011-08-21 ENCOUNTER — Ambulatory Visit (HOSPITAL_BASED_OUTPATIENT_CLINIC_OR_DEPARTMENT_OTHER): Payer: PRIVATE HEALTH INSURANCE | Admitting: Oncology

## 2011-08-21 VITALS — BP 116/75 | HR 79 | Temp 98.6°F | Ht 67.0 in | Wt 139.2 lb

## 2011-08-21 DIAGNOSIS — C50919 Malignant neoplasm of unspecified site of unspecified female breast: Secondary | ICD-10-CM

## 2011-08-21 DIAGNOSIS — Z170 Estrogen receptor positive status [ER+]: Secondary | ICD-10-CM

## 2011-08-21 DIAGNOSIS — Z853 Personal history of malignant neoplasm of breast: Secondary | ICD-10-CM

## 2011-08-21 NOTE — Progress Notes (Signed)
ID: [name]   DOB: 09-08-1960  MR#: [id number]  NWG#:[id number]  HISTORY OF PRESENT ILLNESS: [name] is followed very closely mammographically because of a strong family history of breast cancer which is detailed below.  She had a right breast ultrasound in January 2008 for follow up of a right breast fibroadenoma (this found no significant change as compared to January 2007 or July 2007).    On 11-08-06 she had bilateral diagnostic mammograms and ultrasounds.  She does have dense parenchyma on the right.  There were no findings suspicious for malignancy and the fibroadenoma which was being followed was again found to be unchanged.  In the left breast there were two smoothly  marginated masses which were new compared to prior and they were both found to be cysts by ultrasound.  Because of the patient's high baseline cancer risk, bilateral MRI's were again obtained on 11-25-06 (the prior MRI I believe had been two years before).  Again, the likely fibroadenoma in the lateral aspect of the right breast was noted.  More importantly, a 1.1 cm. enhancing nodule in the inferior aspect of the right breast appeared slightly larger than previously with the suggestion of very minimal margin irregularity.  There were no abnormal appearing lymph nodes and incidentally no liver abnormality was noted.  Because of this change, the focused right breast ultrasound was obtained on  11-28-06.  This showed a 1.4 cm. hypoechoic area where the MRI abnormality was found however, a little bit more laterally than expected, and for that reason MRI biopsy was preferred.  This was performed on 01-20-07 and showed ([id number] and [id number]) a high grade ductal carcinoma in situ which was 99% ER and 98% PR positive. Her subsequent history is as detailed below  INTERVAL HISTORY: [name] returns today for followup of her breast cancer. Interval history is generally unremarkable. Her husband Onalee Hua is not working at present, so she is the  primary breadwinner, and that does increase her stress somewhat.  REVIEW OF SYSTEMS: She is making a great effort to co continue on tamoxifen but she has many side effects but she attributes to at at this point.the symptoms include act half and foot cramps especially on the right side, absentmindedness, continuing hot flashes, joint and muscle aches, indigestions, irritability, discouragement, and empty feeling, lack of libido, hair thinning, the feeling of pelvic heaviness like she is going to have a period, trouble sleeping, fatigue, trouble concentrating, higher percentage of body fat despite a healthy diet, fingernails that will not return to normal, numbness and tingling in her hands or feet. She is also having some blurred vision, drainage from her right ear, and a right facial numbness. A detailed review of systems was otherwise stable  PAST MEDICAL HISTORY: Past Medical History  Diagnosis Date  . Cancer 2008    Breast  . Congenital anomaly of aortic arch   . Unspecified hereditary and idiopathic peripheral neuropathy   . Labyrinthitis, unspecified   . Unspecified hearing loss   . Unspecified tinnitus   . Malignant neoplasm of breast (female), unspecified site   . Other B-complex deficiencies   . Other malaise and fatigue   . Adhesive capsulitis of shoulder   1. Removal of a left breast fibroadenoma in 1980. 2. History of endometriosis diagnosed by laparoscopy I believe in 2003. 3. History of right endometrial artery embolization for fibroid control by Interventional Radiology in April 2008. 4. History of basal cell carcinoma removed from the area beneath the left orbit, status  post Mohs surgery.  5. History of adhesive capsulitis of both shoulders. 6. History of an ascending aortic aneurysm measuring 3.7 cm.  PAST SURGICAL HISTORY: Past Surgical History  Procedure Date  . Breast lumpectomy Oct.29 & Mar 19, 2007    Right  . Breast fibroadenoma surgery 1981    Left  . Wisdom  teeth extracted   . Laparoscopy 1982    for endometriosis  . Abdominal hysterectomy 04/2010    robotic  . Basal cell carcinoma excision 2003    rt eye area  s/p TAH-BSO 05/11/2010  FAMILY HISTORY Family History  Problem Relation Age of Onset  . Seizures Mother   . Migraines Mother   . Hypertension Father   . Other Father   . Hyperlipidemia Father   . Cancer Sister     breast  . Cancer Other     breast  The patient has one full sister who was diagnosed with breast cancer at the age of 54.  She is now 30 and doing very well.  She has a half brother who is 43.  The patient's mother committed suicide at the age of 3.  She did have fibrocystic change but, of course, we would not know if she had lived to develop breast cancer.  The patient's mother's mother however, died from breast cancer at the age of 48.  It had been diagnosed when she was 48.  That maternal grandmother had an additional six sisters, one of whom also had breast cancer, the patient does not know at what age.  On the patient's father's side, the father had two sisters, one of them developed breast cancer in her 29s and apparently died from metastatic breast cancer. There is a history of stomach cancer on her father's side and of bladder cancer on her mother's side, but this is now two degrees removed.  There is no history of ovarian cancer anywhere in the family to the patient's knowledge.  GYNECOLOGIC HISTORY: She is GX P0. She is s/p TAH BSO as of January 2012, with benign pathology  SOCIAL HISTORY: She is a Public house manager.  She is married to [name] who works as a P.A. with Dr. Kerby Nora in the Neurointerventional Radiology area.  They have six cats.  She is not a church attender but describes herself as a very spiritual person.     ADVANCED DIRECTIVES:  HEALTH MAINTENANCE: History  Substance Use Topics  . Smoking status: Never Smoker   . Smokeless tobacco: Never Used  . Alcohol Use:  Yes     wine with drinner a few nights a week     Colonoscopy:  PAP:  Bone density:  Lipid panel:  Allergies  Allergen Reactions  . Iohexol      Desc: Pt states that she developed itching after receiving Magnevist-MRI contrast.   . Methylprednisolone     Current Outpatient Prescriptions  Medication Sig Dispense Refill  . ALPRAZolam (NIRAVAM) 0.25 MG dissolvable tablet Take 0.25 mg by mouth as needed.        . B Complex Vitamins (B COMPLEX 1 PO) Take by mouth.        . Calcium-Magnesium-Vitamin D (CALCIUM MAGNESIUM PO) Take by mouth daily. 4 tablets daily       . Cholecalciferol (VITAMIN D) 1000 UNITS capsule Take 1,000 Units by mouth daily.        . fish oil-omega-3 fatty acids 1000 MG capsule Take 2 g by mouth daily.        Marland Kitchen  fluticasone (FLONASE) 50 MCG/ACT nasal spray Place 1 spray into the nose daily.  16 g  6  . Ginger Root POWD 2 g by Does not apply route daily.        . Magnesium 250 MG TABS Take 1 tablet by mouth 2 (two) times daily.        . multivitamin (THERAGRAN) per tablet Take 1 tablet by mouth daily.        . Psyllium (METAMUCIL PO) Take by mouth.        . tamoxifen (NOLVADEX) 20 MG tablet Take 20 mg by mouth daily.       . TURMERIC PO Take by mouth.        . venlafaxine (EFFEXOR) 37.5 MG tablet Take 37.5 mg by mouth at bedtime.       Marland Kitchen VITAMIN C, CALCIUM ASCORBATE, PO Take 1 tablet by mouth 2 (two) times daily.        . vitamin E 400 UNIT capsule Take 400 Units by mouth daily.        Marland Kitchen GRAPE SEED EXTRACT PO Take by mouth.        . GUGGULIPID PO Take by mouth.          OBJECTIVE: Middle-aged white woman who appears well Filed Vitals:   08/21/11 1114  BP: 116/75  Pulse: 79  Temp: 98.6 F (37 C)     Body mass index is 21.80 kg/(m^2).    ECOG FS: 0  Sclerae unicteric Oropharynx clear No peripheral adenopathy Lungs no rales or rhonchi Heart regular rate and rhythm Abd benign MSK no focal spinal tenderness, no peripheral edema Neuro: nonfocal Breasts:  the right breast is status post lumpectomy. There is no evidence of local recurrence. The left breast is unremarkable  LAB RESULTS: Lab Results  Component Value Date   WBC 6.6 08/14/2011   NEUTROABS 4.3 08/14/2011   HGB 14.3 08/14/2011   HCT 41.9 08/14/2011   MCV 89.1 08/14/2011   PLT 271 08/14/2011      Chemistry      Component Value Date/Time   NA 141 08/14/2011 1016   K 4.3 08/14/2011 1016   CL 107 08/14/2011 1016   CO2 25 08/14/2011 1016   BUN 14 08/14/2011 1016   CREATININE 0.74 08/14/2011 1016      Component Value Date/Time   CALCIUM 8.8 08/14/2011 1016   ALKPHOS 61 08/14/2011 1016   AST 21 08/14/2011 1016   ALT 13 08/14/2011 1016   BILITOT 0.3 08/14/2011 1016       Lab Results  Component Value Date   LABCA2 38 11/06/2010   LABCA2 38 11/06/2010    No components found with this basename: [id number]    No results found for this basename: INR:1;PROTIME:1 in the last 168 hours  Urinalysis    Component Value Date/Time   COLORURINE YELLOW 02/24/2007 1420   APPEARANCEUR CLEAR 02/24/2007 1420   LABSPEC 1.005 08/14/2011 1016   LABSPEC 1.015 02/24/2007 1420   PHURINE 6.0 02/24/2007 1420   GLUCOSEU NEGATIVE 02/24/2007 1420   HGBUR NEGATIVE 02/24/2007 1420   BILIRUBINUR NEGATIVE 02/24/2007 1420   KETONESUR NEGATIVE 02/24/2007 1420   PROTEINUR NEGATIVE 02/24/2007 1420   UROBILINOGEN 0.2 02/24/2007 1420   NITRITE NEGATIVE 02/24/2007 1420   LEUKOCYTESUR NEGATIVE MICROSCOPIC NOT DONE ON URINES WITH NEGATIVE PROTEIN, BLOOD, LEUKOCYTES, NITRITE, OR GLUCOSE <1000 mg/dL. 02/24/2007 1420    STUDIES: Mm Digital Diagnostic Bilat  08/01/2011  *RADIOLOGY REPORT*  Clinical Data:  .  The patient  underwent right lumpectomy, chemotherapy and radiation therapy for breast cancer in 2008.  The patient is taking Tamoxifen.  DIGITAL DIAGNOSTIC BILATERAL MAMMOGRAM WITH CAD  Comparison:  07/04/2010, 06/28/2009, 06/29/2008  Findings:  There are scattered fibroglandular densities.  Right lumpectomy changes  are present.  There is no suspicious dominant mass, nonsurgical architectural distortion or calcification to suggest malignancy. Mammographic images were processed with CAD.  IMPRESSION: No mammographic evidence of malignancy.  Yearly diagnostic mammography is suggested.  BI-RADS CATEGORY 2:  Benign finding(s).  Original Report Authenticated By: Daryl Eastern, M.D.    ASSESSMENT: 51 year old [place] woman status post right lumpectomy and sentinel lymph node biopsy October of 2008 for a 7 mm grade 1 invasive ductal carcinoma, strongly estrogen and progesterone receptor positive, HER2 negative, with an Oncotype recurrence score of 20, predicting a recurrence of 13% after 5 years on tamoxifen, status post Abraxane given weekly x8 completed February 2009, followed by radiation completed April 2009, at which time she started tamoxifen.  She has also tried aromatase inhibitors, namely letrozole and exemestane, with no success.  She resumed tamoxifen October 2012, with poor tolerance as above.   PLAN: I really can't tell how many of Fran's symptoms are due to tamoxifen or simply to stress and in menopause. The only way we're going to find out is to go off tamoxifen, and he takes about 4 months for the drug to get out of once a system. Accordingly she will stop tamoxifen now and see me again some time in late August. If symptoms really haven't changed, we can consider continuing tamoxifen, and of course the current data suggests that continuing it for 10 years may be preferable to 5. If her symptoms have really improved, then we will simply initiate observation alone. She understands these choices and is very much in agreement with this plan.  She requested a prescription for B12 supplementation, which I was glad to write her.  Tyffani Foglesong C    08/21/2011

## 2011-09-01 ENCOUNTER — Other Ambulatory Visit: Payer: Self-pay | Admitting: Hematology and Oncology

## 2011-09-01 DIAGNOSIS — Z853 Personal history of malignant neoplasm of breast: Secondary | ICD-10-CM

## 2011-09-01 MED ORDER — VENLAFAXINE HCL 37.5 MG PO TABS: 37.5000 mg | ORAL_TABLET | Freq: Every day | ORAL | Status: DC

## 2011-09-06 ENCOUNTER — Other Ambulatory Visit: Payer: Self-pay | Admitting: *Deleted

## 2011-09-06 DIAGNOSIS — Z853 Personal history of malignant neoplasm of breast: Secondary | ICD-10-CM

## 2011-09-06 MED ORDER — VENLAFAXINE HCL ER 37.5 MG PO CP24: 37.5000 mg | ORAL_CAPSULE | Freq: Every day | ORAL | Status: DC

## 2012-02-19 ENCOUNTER — Other Ambulatory Visit: Payer: Self-pay | Admitting: Physician Assistant

## 2012-02-19 DIAGNOSIS — Z853 Personal history of malignant neoplasm of breast: Secondary | ICD-10-CM

## 2012-02-25 ENCOUNTER — Other Ambulatory Visit: Payer: Self-pay | Admitting: *Deleted

## 2012-02-25 DIAGNOSIS — Z853 Personal history of malignant neoplasm of breast: Secondary | ICD-10-CM

## 2012-02-28 ENCOUNTER — Ambulatory Visit (HOSPITAL_BASED_OUTPATIENT_CLINIC_OR_DEPARTMENT_OTHER): Payer: PRIVATE HEALTH INSURANCE | Admitting: Physician Assistant

## 2012-02-28 ENCOUNTER — Telehealth: Payer: Self-pay | Admitting: *Deleted

## 2012-02-28 ENCOUNTER — Other Ambulatory Visit (HOSPITAL_BASED_OUTPATIENT_CLINIC_OR_DEPARTMENT_OTHER): Payer: PRIVATE HEALTH INSURANCE | Admitting: Lab

## 2012-02-28 ENCOUNTER — Encounter: Payer: Self-pay | Admitting: Physician Assistant

## 2012-02-28 VITALS — BP 136/80 | HR 66 | Temp 98.1°F | Resp 20 | Ht 67.0 in | Wt 133.1 lb

## 2012-02-28 DIAGNOSIS — Z853 Personal history of malignant neoplasm of breast: Secondary | ICD-10-CM

## 2012-02-28 DIAGNOSIS — H9190 Unspecified hearing loss, unspecified ear: Secondary | ICD-10-CM

## 2012-02-28 DIAGNOSIS — F418 Other specified anxiety disorders: Secondary | ICD-10-CM

## 2012-02-28 DIAGNOSIS — N644 Mastodynia: Secondary | ICD-10-CM

## 2012-02-28 DIAGNOSIS — M792 Neuralgia and neuritis, unspecified: Secondary | ICD-10-CM

## 2012-02-28 DIAGNOSIS — H9319 Tinnitus, unspecified ear: Secondary | ICD-10-CM

## 2012-02-28 DIAGNOSIS — F341 Dysthymic disorder: Secondary | ICD-10-CM

## 2012-02-28 DIAGNOSIS — R079 Chest pain, unspecified: Secondary | ICD-10-CM

## 2012-02-28 DIAGNOSIS — C50419 Malignant neoplasm of upper-outer quadrant of unspecified female breast: Secondary | ICD-10-CM

## 2012-02-28 LAB — CBC WITH DIFFERENTIAL/PLATELET
Basophils Absolute: 0 10*3/uL (ref 0.0–0.1)
Eosinophils Absolute: 0.2 10*3/uL (ref 0.0–0.5)
HCT: 41.5 % (ref 34.8–46.6)
LYMPH%: 32.9 % (ref 14.0–49.7)
MCV: 90.1 fL (ref 79.5–101.0)
MONO#: 0.4 10*3/uL (ref 0.1–0.9)
NEUT#: 2.7 10*3/uL (ref 1.5–6.5)
NEUT%: 53.7 % (ref 38.4–76.8)
Platelets: 304 10*3/uL (ref 145–400)
WBC: 5.1 10*3/uL (ref 3.9–10.3)

## 2012-02-28 LAB — COMPREHENSIVE METABOLIC PANEL (CC13)
BUN: 13 mg/dL (ref 7.0–26.0)
CO2: 23 mEq/L (ref 22–29)
Creatinine: 0.8 mg/dL (ref 0.6–1.1)
Glucose: 107 mg/dl — ABNORMAL HIGH (ref 70–99)
Total Bilirubin: 0.39 mg/dL (ref 0.20–1.20)
Total Protein: 7 g/dL (ref 6.4–8.3)

## 2012-02-28 LAB — LIPID PANEL
Cholesterol: 221 mg/dL — ABNORMAL HIGH (ref 0–200)
HDL: 50 mg/dL (ref >39)
Triglycerides: 109 mg/dL (ref <150)
VLDL: 22 mg/dL (ref 0–40)

## 2012-02-28 MED ORDER — ALPRAZOLAM 0.25 MG PO TBDP: 0.2500 mg | ORAL_TABLET | Freq: Two times a day (BID) | ORAL | Status: DC | PRN

## 2012-02-28 MED ORDER — GABAPENTIN 100 MG PO CAPS: 100.0000 mg | ORAL_CAPSULE | Freq: Two times a day (BID) | ORAL | Status: DC | PRN

## 2012-02-28 MED ORDER — TAMOXIFEN CITRATE 10 MG PO TABS: 10.0000 mg | ORAL_TABLET | Freq: Every day | ORAL | Status: DC

## 2012-02-28 NOTE — Telephone Encounter (Signed)
Called [facility] department at [phone] they stated they will call the patient with the appointment date and time

## 2012-02-28 NOTE — Telephone Encounter (Signed)
Patient is aware of the new date and time for the mammogram and ultrasound  At the breast center

## 2012-02-29 NOTE — Progress Notes (Signed)
ID: Ayelet Gruenewald Hopf   DOB: 15-Jun-1960  MR#: [id number]  WUJ#:[id number]  HISTORY OF PRESENT ILLNESS: [name] is followed very closely mammographically because of a strong family history of breast cancer which is detailed below.  She had a right breast ultrasound in January 2008 for follow up of a right breast fibroadenoma (this found no significant change as compared to January 2007 or July 2007).    On 11-08-06 she had bilateral diagnostic mammograms and ultrasounds.  She does have dense parenchyma on the right.  There were no findings suspicious for malignancy and the fibroadenoma which was being followed was again found to be unchanged.  In the left breast there were two smoothly  marginated masses which were new compared to prior and they were both found to be cysts by ultrasound.  Because of the patient's high baseline cancer risk, bilateral MRI's were again obtained on 11-25-06 (the prior MRI I believe had been two years before).  Again, the likely fibroadenoma in the lateral aspect of the right breast was noted.  More importantly, a 1.1 cm. enhancing nodule in the inferior aspect of the right breast appeared slightly larger than previously with the suggestion of very minimal margin irregularity.  There were no abnormal appearing lymph nodes and incidentally no liver abnormality was noted.  Because of this change, the focused right breast ultrasound was obtained on  11-28-06.  This showed a 1.4 cm. hypoechoic area where the MRI abnormality was found however, a little bit more laterally than expected, and for that reason MRI biopsy was preferred.  This was performed on 01-20-07 and showed ([id number]) a high grade ductal carcinoma in situ which was 99% ER and 98% PR positive. Her subsequent history is as detailed below  INTERVAL HISTORY: [name] returns today for followup of her right breast cancer. Interval history is generally unremarkable. Her husband Onalee Hua is getting ready to start a part  time job in the stroke unit. [name] herself continues to work full-time.  [name] discontinued tamoxifen as suggested in April 2013. She has noted a significant improvement in side effects as noted below.   REVIEW OF SYSTEMS: [name] had many concerns to discuss today. She does feel better since going off of the tamoxifen, but is very worried about staying off of the medication. She would very much like to try a reduced dose as his detailed below.  At this point, she has only occasional, random hot flashes. She denies any cramping. Her energy level has improved. She has fewer joint and muscle aches.  [name] continues to have anterior rib pain bilaterally, greater on the right than the left, just below the inframammary fold. She's having some pain in the lateral portion of the left breast and tells me it feels "ropey". She continues to have tinnitus, with increased hearing loss in the right ear since chemotherapy. She feels anxious at times, and somewhat curable. She feels dizzy when taking the Effexor and would like to taper off the drug.  [name] denies any recent illnesses and has had no fevers, chills, or night sweats. She is eating well and denies nausea or change in bowel or bladder habits. She's had no cough or increased shortness of breath. No chest pain. She occasionally feels palpitations which she attributes to anxiety. She's had no abnormal headaches or change in vision. The peripheral neuropathy seems to have improved. She's had no peripheral swelling.  A detailed review of systems is otherwise stable and noncontributory.  PAST MEDICAL HISTORY: Past  Medical History  Diagnosis Date  . Cancer 2008    Breast  . Congenital anomaly of aortic arch   . Unspecified hereditary and idiopathic peripheral neuropathy   . Labyrinthitis, unspecified   . Unspecified hearing loss   . Unspecified tinnitus   . Malignant neoplasm of breast (female), unspecified site   . Other B-complex deficiencies   . Other  malaise and fatigue   . Adhesive capsulitis of shoulder   1. Removal of a left breast fibroadenoma in 1980. 2. History of endometriosis diagnosed by laparoscopy I believe in 2003. 3. History of right endometrial artery embolization for fibroid control by Interventional Radiology in April 2008. 4. History of basal cell carcinoma removed from the area beneath the left orbit, status post Mohs surgery.  5. History of adhesive capsulitis of both shoulders. 6. History of an ascending aortic aneurysm measuring 3.7 cm.  PAST SURGICAL HISTORY: Past Surgical History  Procedure Date  . Breast lumpectomy Oct.29 & Mar 19, 2007    Right  . Breast fibroadenoma surgery 1981    Left  . Wisdom teeth extracted   . Laparoscopy 1982    for endometriosis  . Abdominal hysterectomy 04/2010    robotic  . Basal cell carcinoma excision 2003    rt eye area  s/p TAH-BSO 05/11/2010  FAMILY HISTORY Family History  Problem Relation Age of Onset  . Seizures Mother   . Migraines Mother   . Hypertension Father   . Other Father   . Hyperlipidemia Father   . Cancer Sister     breast  . Cancer Other     breast  The patient has one full sister who was diagnosed with breast cancer at the age of 56.  She is now 95 and doing very well.  She has a half brother who is 27.  The patient's mother committed suicide at the age of 3.  She did have fibrocystic change but, of course, we would not know if she had lived to develop breast cancer.  The patient's mother's mother however, died from breast cancer at the age of 68.  It had been diagnosed when she was 48.  That maternal grandmother had an additional six sisters, one of whom also had breast cancer, the patient does not know at what age.  On the patient's father's side, the father had two sisters, one of them developed breast cancer in her 89s and apparently died from metastatic breast cancer. There is a history of stomach cancer on her father's side and of bladder cancer  on her mother's side, but this is now two degrees removed.  There is no history of ovarian cancer anywhere in the family to the patient's knowledge.  GYNECOLOGIC HISTORY: She is GX P0. She is s/p TAH BSO as of January 2012, with benign pathology  SOCIAL HISTORY: She is a Public house manager.  She is married to [name] who works as a Building services engineer.  They have six cats.  She is not a church attender but describes herself as a very spiritual person.     ADVANCED DIRECTIVES:  HEALTH MAINTENANCE: History  Substance Use Topics  . Smoking status: Never Smoker   . Smokeless tobacco: Never Used  . Alcohol Use: Yes     wine with drinner a few nights a week     Colonoscopy:  PAP:  Bone density:  Lipid panel:  Allergies  Allergen Reactions  . Iohexol      Desc: Pt states that  she developed itching after receiving Magnevist-MRI contrast.   . Methylprednisolone   . Polysorbate Hives    "Polysorbate 80 preservative"    Current Outpatient Prescriptions  Medication Sig Dispense Refill  . ALPRAZolam (NIRAVAM) 0.25 MG dissolvable tablet Take 1 tablet (0.25 mg total) by mouth 2 (two) times daily as needed for anxiety.  30 tablet  0  . Calcium-Magnesium-Vitamin D (CALCIUM MAGNESIUM PO) Take by mouth daily. 4 tablets daily       . Cholecalciferol (VITAMIN D) 1000 UNITS capsule Take 1,000 Units by mouth daily.        . fish oil-omega-3 fatty acids 1000 MG capsule Take 2 g by mouth daily.        . Ginger Root POWD 2 g by Does not apply route daily.        . Magnesium 250 MG TABS Take 1 tablet by mouth 2 (two) times daily.        . multivitamin (THERAGRAN) per tablet Take 1 tablet by mouth daily.        . Psyllium (METAMUCIL PO) Take by mouth.        . TURMERIC PO Take by mouth.        . venlafaxine XR (EFFEXOR-XR) 37.5 MG 24 hr capsule Take 1 capsule (37.5 mg total) by mouth daily.  90 capsule  4  . VITAMIN C, CALCIUM ASCORBATE, PO Take 1 tablet by mouth 2 (two) times daily.        Marland Kitchen  gabapentin (NEURONTIN) 100 MG capsule Take 1 capsule (100 mg total) by mouth 2 (two) times daily as needed (pain).  60 capsule  5  . tamoxifen (NOLVADEX) 10 MG tablet Take 1 tablet (10 mg total) by mouth daily.  30 tablet  6    OBJECTIVE: Middle-aged white woman who appears well Filed Vitals:   02/28/12 0907  BP: 136/80  Pulse: 66  Temp: 98.1 F (36.7 C)  Resp: 20     Body mass index is 20.85 kg/(m^2).    ECOG FS: 0 Filed Weights   02/28/12 0907  Weight: 133 lb 1.6 oz (60.374 kg)   Sclerae unicteric Oropharynx clear No peripheral adenopathy Lungs no rales or rhonchi Heart regular rate and rhythm Abd soft, nontender, with positive bowel sounds MSK no focal spinal tenderness, the anterior ribs are somewhat tender to palpation bilaterally, just beneath the inframammary folds, greater so on the right than the left. No peripheral edema Neuro: nonfocal, alert and oriented x3 Breasts: the right breast is status post lumpectomy. There is no evidence of local recurrence. The left breast is notable for pain with palpation along the lateral edge. The tissue along the lateral edge is somewhat dense, but I do not palpate a distinct mass or abnormality today. Axillae are benign bilaterally, no adenopathy  LAB RESULTS: Lab Results  Component Value Date   WBC 5.1 02/28/2012   NEUTROABS 2.7 02/28/2012   HGB 14.6 02/28/2012   HCT 41.5 02/28/2012   MCV 90.1 02/28/2012   PLT 304 02/28/2012      Chemistry      Component Value Date/Time   NA 138 02/28/2012 0846   NA 141 08/14/2011 1016   K 4.2 02/28/2012 0846   K 4.3 08/14/2011 1016   CL 106 02/28/2012 0846   CL 107 08/14/2011 1016   CO2 23 02/28/2012 0846   CO2 25 08/14/2011 1016   BUN 13.0 02/28/2012 0846   BUN 14 08/14/2011 1016   CREATININE 0.8 02/28/2012 0846  CREATININE 0.74 08/14/2011 1016      Component Value Date/Time   CALCIUM 9.3 02/28/2012 0846   CALCIUM 8.8 08/14/2011 1016   ALKPHOS 70 02/28/2012 0846   ALKPHOS 61  08/14/2011 1016   AST 22 02/28/2012 0846   AST 21 08/14/2011 1016   ALT 19 02/28/2012 0846   ALT 13 08/14/2011 1016   BILITOT 0.39 02/28/2012 0846   BILITOT 0.3 08/14/2011 1016       Lab Results  Component Value Date   LABCA2 38 11/06/2010   LABCA2 38 11/06/2010     STUDIES: Mm Digital Diagnostic Bilat  08/01/2011  *RADIOLOGY REPORT*  Clinical Data:  .  The patient underwent right lumpectomy, chemotherapy and radiation therapy for breast cancer in 2008.  The patient is taking Tamoxifen.  DIGITAL DIAGNOSTIC BILATERAL MAMMOGRAM WITH CAD  Comparison:  07/04/2010, 06/28/2009, 06/29/2008  Findings:  There are scattered fibroglandular densities.  Right lumpectomy changes are present.  There is no suspicious dominant mass, nonsurgical architectural distortion or calcification to suggest malignancy. Mammographic images were processed with CAD.  IMPRESSION: No mammographic evidence of malignancy.  Yearly diagnostic mammography is suggested.  BI-RADS CATEGORY 2:  Benign finding(s).  Original Report Authenticated By: Daryl Eastern, M.D.    ASSESSMENT: 51 year old [place] woman   (1)  status post right lumpectomy and sentinel lymph node biopsy October of 2008 for a 7 mm grade 1 invasive ductal carcinoma, strongly estrogen and progesterone receptor positive, HER2 negative, with an Oncotype recurrence score of 20, predicting a recurrence of 13% after 5 years on tamoxifen,   (2)  status post Abraxane given weekly x8 completed February 2009, followed by radiation completed April 2009, at which time she started tamoxifen.    (3)  She has also tried aromatase inhibitors, namely letrozole and exemestane, with no success.  She resumed tamoxifen October 2012, with poor tolerance as above.  Tamoxifen has been held since April 2013 with improved side effects.  PLAN:  [name] would very much like to try the tamoxifen again, but at a lower dose of 10 mg daily. She understands that this is not the standard dose,  and we certainly cannot assure her of its benefit, but she feels like it is "better than nothing".   She would like to taper off of her Effexor, and I have given her a very slow tapering schedule over the next 2 months. If necessary, her next choice would likely be a drug like Lexapro to help treat her depression and her anxiety. In the meanwhile, I have also refilled her Xanax which she takes only occasionally, 0.25 mg as needed.  With regards to the rib pain, [name] and I both agree that this is likely secondary to neuropathic pain from either the surgery or radiation. We discussed possible chest x-ray but she agreed to try gabapentin first. We'll started a very low dose of 100 mg at night, and work up to twice daily. Of course that dose can be increased as tolerated.  I am referring [name] for a left diagnostic mammogram and ultrasound to evaluate the pain in the lateral edge of the left breast. We will also refer her to an audiologist for further evaluation of the tinnitus and hearing loss, possibly for a hearing aid.  All this was discussed thoroughly with [name] today. Over half of our one-hour appointment was spent counseling the patient regarding her concerns, discussing options, and coordinating care. She understands to call with any changes or problems, but otherwise will return  in 6 months for routine followup.   Soraya Paquette    02/29/2012

## 2012-03-05 ENCOUNTER — Other Ambulatory Visit: Payer: PRIVATE HEALTH INSURANCE

## 2012-03-12 ENCOUNTER — Other Ambulatory Visit: Payer: Self-pay | Admitting: Physician Assistant

## 2012-03-13 ENCOUNTER — Telehealth: Payer: Self-pay | Admitting: Oncology

## 2012-03-13 ENCOUNTER — Other Ambulatory Visit: Payer: Self-pay | Admitting: *Deleted

## 2012-03-13 MED ORDER — VENLAFAXINE HCL 37.5 MG PO TABS: ORAL_TABLET | ORAL | Status: DC

## 2012-03-13 NOTE — Telephone Encounter (Signed)
lmonvm adviisng the pt of her r/s mammo and [place] appts for nov.

## 2012-03-14 ENCOUNTER — Other Ambulatory Visit: Payer: PRIVATE HEALTH INSURANCE | Admitting: Lab

## 2012-03-14 ENCOUNTER — Ambulatory Visit: Payer: PRIVATE HEALTH INSURANCE | Admitting: Physician Assistant

## 2012-03-19 ENCOUNTER — Ambulatory Visit
Admission: RE | Admit: 2012-03-19 | Discharge: 2012-03-19 | Disposition: A | Discharge status: Home / Self Care | Payer: PRIVATE HEALTH INSURANCE | Source: Ambulatory Visit | Attending: Physician Assistant | Admitting: Physician Assistant

## 2012-03-19 DIAGNOSIS — N644 Mastodynia: Secondary | ICD-10-CM

## 2012-03-19 DIAGNOSIS — Z853 Personal history of malignant neoplasm of breast: Secondary | ICD-10-CM

## 2012-04-28 ENCOUNTER — Encounter: Payer: Self-pay | Admitting: Internal Medicine

## 2012-04-28 ENCOUNTER — Ambulatory Visit (INDEPENDENT_AMBULATORY_CARE_PROVIDER_SITE_OTHER): Payer: 59 | Admitting: Internal Medicine

## 2012-04-28 ENCOUNTER — Ambulatory Visit: Payer: PRIVATE HEALTH INSURANCE | Admitting: Internal Medicine

## 2012-04-28 VITALS — BP 130/78 | HR 86 | Temp 98.5°F | Resp 16 | Wt 135.5 lb

## 2012-04-28 DIAGNOSIS — J019 Acute sinusitis, unspecified: Secondary | ICD-10-CM

## 2012-04-28 MED ORDER — CEFUROXIME AXETIL 500 MG PO TABS: 500.0000 mg | ORAL_TABLET | Freq: Two times a day (BID) | ORAL | Status: DC

## 2012-04-28 NOTE — Assessment & Plan Note (Signed)
Treat with ceftin Continue sudafed

## 2012-04-28 NOTE — Patient Instructions (Signed)

## 2012-04-28 NOTE — Progress Notes (Signed)
  Subjective:    Patient ID: [name], female    DOB: 1960/07/22, 51 y.o.   MRN: [id number]  Sinusitis This is a new problem. The current episode started more than 1 month ago. The problem has been gradually worsening since onset. There has been no fever. The fever has been present for less than 1 day. Her pain is at a severity of 0/10. She is experiencing no pain. Associated symptoms include congestion, ear pain and sinus pressure. Pertinent negatives include no chills, coughing, diaphoresis, headaches, hoarse voice, neck pain, shortness of breath, sneezing, sore throat or swollen glands. Past treatments include oral decongestants. The treatment provided moderate relief.      Review of Systems  Constitutional: Positive for fatigue. Negative for fever, chills, diaphoresis, activity change, appetite change and unexpected weight change.  HENT: Positive for ear pain, congestion, rhinorrhea, postnasal drip and sinus pressure. Negative for nosebleeds, sore throat, hoarse voice, facial swelling, sneezing, trouble swallowing, neck pain, dental problem, voice change, tinnitus and ear discharge.   Eyes: Negative.   Respiratory: Negative for cough, shortness of breath and wheezing.   Cardiovascular: Negative.   Gastrointestinal: Negative.   Genitourinary: Negative.   Musculoskeletal: Negative.   Skin: Negative.   Neurological: Negative.  Negative for headaches.  Hematological: Negative for adenopathy. Does not bruise/bleed easily.       Objective:   Physical Exam  Vitals reviewed. Constitutional: She is oriented to person, place, and time. She appears well-developed and well-nourished.  Non-toxic appearance. She does not have a sickly appearance. She does not appear ill. No distress.  HENT:  Head: No trismus in the jaw.  Right Ear: Hearing, tympanic membrane, external ear and ear canal normal.  Left Ear: Hearing, tympanic membrane, external ear and ear canal normal.  Nose: Mucosal  edema and rhinorrhea present. No sinus tenderness. No epistaxis.  No foreign bodies. Right sinus exhibits maxillary sinus tenderness. Right sinus exhibits no frontal sinus tenderness. Left sinus exhibits maxillary sinus tenderness. Left sinus exhibits no frontal sinus tenderness.  Mouth/Throat: Oropharynx is clear and moist and mucous membranes are normal. Mucous membranes are not pale, not dry and not cyanotic. No oral lesions. No uvula swelling. No oropharyngeal exudate, posterior oropharyngeal edema, posterior oropharyngeal erythema or tonsillar abscesses.  Eyes: Conjunctivae normal are normal. Right eye exhibits no discharge. Left eye exhibits no discharge. No scleral icterus.  Neck: Normal range of motion. Neck supple. No JVD present. No tracheal deviation present. No thyromegaly present.  Cardiovascular: Normal rate, regular rhythm, normal heart sounds and intact distal pulses.  Exam reveals no gallop and no friction rub.   No murmur heard. Pulmonary/Chest: Effort normal and breath sounds normal. No stridor. No respiratory distress. She has no wheezes. She has no rales. She exhibits no tenderness.  Abdominal: Soft. Bowel sounds are normal. She exhibits no distension and no mass. There is no tenderness. There is no rebound and no guarding.  Musculoskeletal: Normal range of motion. She exhibits no edema and no tenderness.  Lymphadenopathy:    She has no cervical adenopathy.  Neurological: She is oriented to person, place, and time.  Skin: Skin is warm and dry. No rash noted. She is not diaphoretic. No erythema. No pallor.  Psychiatric: She has a normal mood and affect. Her behavior is normal. Judgment and thought content normal.          Assessment & Plan:

## 2012-05-03 ENCOUNTER — Other Ambulatory Visit: Payer: Self-pay | Admitting: Physician Assistant

## 2012-05-08 ENCOUNTER — Encounter: Payer: Self-pay | Admitting: Internal Medicine

## 2012-05-08 ENCOUNTER — Ambulatory Visit (INDEPENDENT_AMBULATORY_CARE_PROVIDER_SITE_OTHER): Payer: 59 | Admitting: Internal Medicine

## 2012-05-08 ENCOUNTER — Telehealth: Payer: Self-pay | Admitting: Internal Medicine

## 2012-05-08 ENCOUNTER — Other Ambulatory Visit: Payer: 59

## 2012-05-08 VITALS — BP 122/78 | HR 76 | Temp 98.1°F | Ht 67.0 in | Wt 135.0 lb

## 2012-05-08 DIAGNOSIS — R350 Frequency of micturition: Secondary | ICD-10-CM

## 2012-05-08 DIAGNOSIS — R3915 Urgency of urination: Secondary | ICD-10-CM

## 2012-05-08 LAB — POCT URINALYSIS DIPSTICK
Bilirubin, UA: NEGATIVE
Blood, UA: NEGATIVE
Glucose, UA: NEGATIVE
Ketones, UA: NEGATIVE
Leukocytes, UA: NEGATIVE
Nitrite, UA: NEGATIVE
Protein, UA: NEGATIVE
Spec Grav, UA: 1.015
Urobilinogen, UA: 0.2
pH, UA: 6

## 2012-05-08 MED ORDER — CIPROFLOXACIN HCL 500 MG PO TABS: 500.0000 mg | ORAL_TABLET | Freq: Two times a day (BID) | ORAL | Status: DC

## 2012-05-08 NOTE — Telephone Encounter (Signed)
Patient Information:  Caller Name: Dorothy  Phone: [phone]  Patient: , [name]  Gender: Female  DOB: [birth year]  Age: 52 Years  PCP: Illene Regulus (Adults only)  Pregnant: No  Office Follow Up:  Does the office need to follow up with this patient?: No  Instructions For The Office: N/A   Symptoms  Reason For Call & Symptoms: Patient seen 04/28/12 for sinusitis and UTI.  She was prescribed Ceftin. The sinus infection is better ; but UTI symptoms linger. +tired, + nausea, burning, +frequency, +pressure .  No u/a done in office or culture  Reviewed Health History In EMR: Yes  Reviewed Medications In EMR: Yes  Reviewed Allergies In EMR: Yes  Reviewed Surgeries / Procedures: No  Date of Onset of Symptoms: 04/28/2012  Treatments Tried: fluids. Cranberry juice  Treatments Tried Worked: No OB / GYN:  LMP: Unknown  Guideline(s) Used:  Urination Pain - Female  Disposition Per Guideline:   See Today in Office  Reason For Disposition Reached:   Age > 50 years  Advice Given:  Fluids:   Drink extra fluids. Drink 8-10 glasses of liquids a day (Reason: to produce a dilute, non-irritating urine).  Cranberry Juice:   Some people think that drinking cranberry juice may help in fighting urinary tract infections. However, there is no good research that has ever proved this.  Warm Saline SITZ Baths to Reduce Pain:  Sit in a warm saline bath for 20 minutes to cleanse the area and to reduce pain. Add 2 oz. of table salt or baking soda to a tub of water.  Call Back If:  You become worse.  Warm Saline SITZ Baths to Reduce Pain:  Sit in a warm saline bath for 20 minutes to cleanse the area and to reduce pain. Add 2 oz. of table salt or baking soda to a tub of water.  Call Back If:   Fever lasts more than 24 hours on antibiotics  Pain does not improve by day 3 on antibiotics  Urine symptoms do not improve by day 3 on antibiotics  You become worse.  Appointment Scheduled:  05/08/2012  10:15:00 Appointment Scheduled Provider:  Nicki Reaper

## 2012-05-08 NOTE — Patient Instructions (Addendum)
Urinary Tract Infection Urinary tract infections (UTIs) can develop anywhere along your urinary tract. Your urinary tract is your body's drainage system for removing wastes and extra water. Your urinary tract includes two kidneys, two ureters, a bladder, and a urethra. Your kidneys are a pair of bean-shaped organs. Each kidney is about the size of your fist. They are located below your ribs, one on each side of your spine. CAUSES Infections are caused by microbes, which are microscopic organisms, including fungi, viruses, and bacteria. These organisms are so small that they can only be seen through a microscope. Bacteria are the microbes that most commonly cause UTIs. SYMPTOMS  Symptoms of UTIs may vary by age and gender of the patient and by the location of the infection. Symptoms in young women typically include a frequent and intense urge to urinate and a painful, burning feeling in the bladder or urethra during urination. Older women and men are more likely to be tired, shaky, and weak and have muscle aches and abdominal pain. A fever may mean the infection is in your kidneys. Other symptoms of a kidney infection include pain in your back or sides below the ribs, nausea, and vomiting. DIAGNOSIS To diagnose a UTI, your caregiver will ask you about your symptoms. Your caregiver also will ask to provide a urine sample. The urine sample will be tested for bacteria and white blood cells. White blood cells are made by your body to help fight infection. TREATMENT  Typically, UTIs can be treated with medication. Because most UTIs are caused by a bacterial infection, they usually can be treated with the use of antibiotics. The choice of antibiotic and length of treatment depend on your symptoms and the type of bacteria causing your infection. HOME CARE INSTRUCTIONS  If you were prescribed antibiotics, take them exactly as your caregiver instructs you. Finish the medication even if you feel better after you  have only taken some of the medication.  Drink enough water and fluids to keep your urine clear or pale yellow.  Avoid caffeine, tea, and carbonated beverages. They tend to irritate your bladder.  Empty your bladder often. Avoid holding urine for long periods of time.  Empty your bladder before and after sexual intercourse.  After a bowel movement, women should cleanse from front to back. Use each tissue only once. SEEK MEDICAL CARE IF:   You have back pain.  You develop a fever.  Your symptoms do not begin to resolve within 3 days. SEEK IMMEDIATE MEDICAL CARE IF:   You have severe back pain or lower abdominal pain.  You develop chills.  You have nausea or vomiting.  You have continued burning or discomfort with urination. MAKE SURE YOU:   Understand these instructions.  Will watch your condition.  Will get help right away if you are not doing well or get worse. Document Released: 01/24/2005 Document Revised: 10/16/2011 Document Reviewed: 05/25/2011 Exit[employer] Patient Information 2013 [employer].  

## 2012-05-08 NOTE — Progress Notes (Signed)
HPI  Pt presents to the clinic today with c/o urinary frequency and urgency since 04/28/2012. She was actually seen that day by Dr. Yetta Barre who gave her Ceftin for a sinus infection. She thought that antibiotic would clear up the urinary symptoms. She took her last pill yesterday and is still having the urinary symptoms. She has also tried cranberry juice which has not helped. She denies fever, chills, nausea, or back pain.   Review of Systems  Past Medical History  Diagnosis Date  . Cancer 2008    Breast  . Congenital anomaly of aortic arch   . Unspecified hereditary and idiopathic peripheral neuropathy   . Labyrinthitis, unspecified   . Unspecified hearing loss   . Unspecified tinnitus   . Malignant neoplasm of breast (female), unspecified site   . Other B-complex deficiencies   . Other malaise and fatigue   . Adhesive capsulitis of shoulder     Family History  Problem Relation Age of Onset  . Seizures Mother   . Migraines Mother   . Hypertension Father   . Other Father   . Hyperlipidemia Father   . Cancer Sister     breast  . Cancer Other     breast    History   Social History  . Marital Status: Married    Spouse Name: N/A    Number of Children: 0  . 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 Smoking status: Never Smoker   . Smokeless tobacco: Never Used  . Alcohol Use: No     Comment: wine with drinner a few nights a week  . Drug Use: No  . Sexually Active: Not on file   Other Topics Concern  . Not on file   Social History Narrative   HSG, many types of education no formal degree. Married '85, no children. 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 Strong interest in herbology and alternative healing.     Allergies  Allergen Reactions  . Polysorbate Anaphylaxis    "Polysorbate 80 preservative"  .  Iohexol      Desc: Pt states that she developed itching after receiving Magnevist-MRI contrast.   . Methylprednisolone     Constitutional: Denies fever, malaise, fatigue, headache or abrupt weight changes.   GU: Pt reports urgency, frequency. Denies pain with urination, burning sensation, blood in urine, odor or discharge. Skin: Denies redness, rashes, lesions or ulcercations.   No other specific complaints in a complete review of systems (except as listed in HPI above).    Objective:   Physical Exam  BP 122/78  Pulse 76  Temp 98.1 F (36.7 C) (Oral)  Ht 5\' 7"  (1.702 m)  Wt 135 lb (61.236 kg)  BMI 21.14 kg/m2  SpO2 99% Wt Readings from Last 3 Encounters:  05/08/12 135 lb (61.236 kg)  04/28/12 135 lb 8 oz (61.462 kg)  02/28/12 133 lb 1.6 oz (60.374 kg)    General: Appears her stated age, well developed, well nourished in NAD. Cardiovascular: Normal rate and rhythm. S1,S2 noted.  No murmur, rubs or gallops noted. No JVD or BLE edema. No carotid bruits noted. Pulmonary/Chest: Normal effort and positive vesicular breath sounds. No respiratory distress. No wheezes, rales or ronchi noted.  Abdomen: Soft and nontender. Normal bowel sounds, no bruits noted. No distention or masses noted. Liver,  spleen and kidneys non palpable. Tender to palpation over the bladder area. No CVA tenderness.      Assessment & Plan:   Urgency Frequency  Will obtain urinalysis- negative, likely due to prior antibiotic eRx for Cipro 500 mg BID x 3 days Drink plenty of fluids  RTC as needed or if symptoms persist.

## 2012-05-10 LAB — URINE CULTURE

## 2012-05-14 ENCOUNTER — Ambulatory Visit: Payer: PRIVATE HEALTH INSURANCE | Admitting: Obstetrics and Gynecology

## 2012-05-28 ENCOUNTER — Ambulatory Visit (INDEPENDENT_AMBULATORY_CARE_PROVIDER_SITE_OTHER): Payer: 59 | Admitting: Internal Medicine

## 2012-05-28 VITALS — BP 120/82 | HR 72 | Temp 98.7°F | Resp 16

## 2012-05-28 DIAGNOSIS — Z853 Personal history of malignant neoplasm of breast: Secondary | ICD-10-CM

## 2012-05-28 DIAGNOSIS — E7800 Pure hypercholesterolemia, unspecified: Secondary | ICD-10-CM

## 2012-05-28 MED ORDER — ESCITALOPRAM OXALATE 5 MG PO TABS: 5.0000 mg | ORAL_TABLET | Freq: Every day | ORAL | Status: DC

## 2012-05-28 NOTE — Progress Notes (Signed)
Subjective:    Patient ID: [name], female    DOB: Feb 12, 1961, 52 y.o.   MRN: [id number]  HPI Recently seen by Nicki Reaper and was treated with ceftin for URI/UTI. Symptoms resolved.  Had follow-up with Dr. Darnelle Catalan. Felt good off tamoxifen. New data suggests Tamoxifen for 10 years! She is a high metabolizer and went on Tamoxifen 10 mg daily. She has had recurrent symptoms: bone pain, night sweats. She was on effexor ER 37.5 mg 1/2 tab but wants to wean off and lexapro was suggested. She has questions about treatment options.  She was recently found to have an elevated LDL at 149 with an excellent HDL and moderate elevation in TC. She was advised to see her PCP for treatment.  Past Medical History  Diagnosis Date  . Cancer 2008    Breast  . Congenital anomaly of aortic arch   . Unspecified hereditary and idiopathic peripheral neuropathy   . Labyrinthitis, unspecified   . Unspecified hearing loss   . Unspecified tinnitus   . Malignant neoplasm of breast (female), unspecified site   . Other B-complex deficiencies   . Other malaise and fatigue   . Adhesive capsulitis of shoulder    Past Surgical History  Procedure Date  . Breast lumpectomy Oct.29 & Mar 19, 2007    Right  . Breast fibroadenoma surgery 1981    Left  . Wisdom teeth extracted   . Laparoscopy 1982    for endometriosis  . Abdominal hysterectomy 04/2010    robotic  . Basal cell carcinoma excision 2003    rt eye area   Family History  Problem Relation Age of Onset  . Seizures Mother   . Migraines Mother   . Hypertension Father   . Other Father   . Hyperlipidemia Father   . Cancer Sister     breast  . Cancer Other     breast   History   Social History  . Marital Status: Married    Spouse Name: N/A    Number of Children: 0  . 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 Smoking status: Never Smoker   . Smokeless  tobacco: Never Used  . Alcohol Use: No     Comment: wine with drinner a few nights a week  . Drug Use: No  . Sexually Active: Not on file   Other Topics Concern  . Not on file   Social History Narrative   HSG, many types of education no formal degree. Married '85, no children. 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 Strong interest in herbology and alternative healing.     Current Outpatient Prescriptions on File Prior to Visit  Medication Sig Dispense Refill  . ALPRAZolam (NIRAVAM) 0.25 MG dissolvable tablet DISSOLVE 1 TABLET BY MOUTH TWICE A DAY AS NEEDED FOR ANXIETY  30 tablet  1  . Calcium-Magnesium-Vitamin D (CALCIUM MAGNESIUM PO) Take by mouth daily. 4 tablets daily       . Cholecalciferol (VITAMIN D) 1000 UNITS capsule Take 1,000 Units by mouth daily.        . ciprofloxacin (CIPRO) 500 MG tablet Take 1 tablet (500 mg total) by mouth 2 (two) times daily.  10 tablet  0  . fish oil-omega-3 fatty acids 1000 MG capsule Take 2 g by mouth daily.        Marland Kitchen  gabapentin (NEURONTIN) 100 MG capsule Take 1 capsule (100 mg total) by mouth 2 (two) times daily as needed (pain).  60 capsule  5  . Ginger Root POWD 2 g by Does not apply route daily.        . multivitamin (THERAGRAN) per tablet Take 1 tablet by mouth daily.        . Psyllium (METAMUCIL PO) Take by mouth.        . tamoxifen (NOLVADEX) 10 MG tablet Take 1 tablet (10 mg total) by mouth daily.  30 tablet  6  . TURMERIC PO Take by mouth.        Marland Kitchen VITAMIN C, CALCIUM ASCORBATE, PO Take 1 tablet by mouth 2 (two) times daily.        Marland Kitchen escitalopram (LEXAPRO) 5 MG tablet Take 1 tablet (5 mg total) by mouth daily.  30 tablet  5     Review of Systems System review is negative for any constitutional, cardiac, pulmonary, GI or neuro symptoms or complaints other than as described in the HPI.     Objective:   Physical Exam Filed Vitals:   05/28/12 1003  BP: 120/82    Pulse: 72  Temp: 98.7 F (37.1 C)  Resp: 16   Gen'l- WNWD vivacious woman in no distress HEENT- C&S clear Cor- 2+ radial pulse Pulm - normal respirations Neuro - A&O x 3, normal gait      Assessment & Plan:

## 2012-05-28 NOTE — Patient Instructions (Addendum)
Breast cancer - I would be asking the question about the real reduction in risk for you as an individual with extended tamoxifen treatment to be sure the risk reduction is worth both the side effects of the treatment as well as the side effects of the drugs for the side effects. I feel certain that Dr. Darnelle Catalan can provide this information.  Cholesterol issues: total cholesterol (least important number) is mildly elevated at 221. The HDL is better than goal of 40+ (NCEP-ATPIII), the LDL is greater than goal of 130 or less but less than the treatment threshold of 160+; the TC/HDL ration at 2 is about 1/2 average risk; the LDL/HDL ratio of 3 is average risk. A cardiac risk calculation using the NCEP-ATPIII [place] data gives you a 1% risk of a cardiac event in the next 10 years based on age, gender, TC, HDL, smoking status and blood pressure. It doesn't get much better than that and I would not recommend medical therapy.  Anxiolytic meds: your current dose of Effexor is almost 100% serotonin effect and very little norepinephrine. You can transition to lexapro with a 2 or 3 day overlap.

## 2012-05-29 DIAGNOSIS — E7800 Pure hypercholesterolemia, unspecified: Secondary | ICD-10-CM | POA: Insufficient documentation

## 2012-05-29 NOTE — Assessment & Plan Note (Signed)
Cholesterol issues: total cholesterol (least important number) is mildly elevated at 221. The HDL is better than goal of 40+ (NCEP-ATPIII), the LDL is greater than goal of 130 or less but less than the treatment threshold of 160+; the TC/HDL ration at 2 is about 1/2 average risk; the LDL/HDL ratio of 3 is average risk. A cardiac risk calculation using the NCEP-ATPIII [place] data gives a 1% risk of a cardiac event in the next 10 years based on age, gender, TC, HDL, smoking status and blood pressure. It doesn't get much better than that and I would not recommend medical therapy.

## 2012-05-29 NOTE — Assessment & Plan Note (Signed)
[name] has done well. She had many symptoms while on tamoxifen and reports she felt really well while off drug. Recently she has restarted tamoxifen due to new studies that revealed enhanced risk reduction of recurrence with longer, 10 year, treatment. She was BRCA negative. She has had recurrent bone pain as well as psychologic symptoms with resumption of medication.  Plan Advised that she ask about the specifics in regard to reduction in risk of recurrence to inform her decision about treatment. She is directed to Dr. Darnelle Catalan for this discussion.

## 2012-06-14 ENCOUNTER — Other Ambulatory Visit: Payer: Self-pay

## 2012-06-19 ENCOUNTER — Ambulatory Visit: Payer: PRIVATE HEALTH INSURANCE | Admitting: Obstetrics and Gynecology

## 2012-07-14 ENCOUNTER — Telehealth: Payer: Self-pay | Admitting: Internal Medicine

## 2012-07-14 NOTE — Telephone Encounter (Signed)
Patient Information:  Caller Name: [name]  Phone: [phone]  Patient: [name],   Gender: Female  DOB: 04-Jun-1960  Age: 52 Years  PCP: Illene Regulus (Adults only)  Pregnant: No  Office Follow Up:  Does the office need to follow up with this patient?: No  Instructions For The Office: N/A   Symptoms  Reason For Call & Symptoms: [name] states she was exposed to poison [place] on 07/09/12. Has been using Hydrocortisone, Benadryl, Epsom salts baths and Calamine. Has itchy rash on arms, hands, legs, arms, chest ,buttocks and back. Little improvement with over the counter treatments. Has be seen in office today protocol due to rash over more than 1/4 of body. States she is concerned about road conditions due to ice. Requesting a prescription for  Hydrocortisone be called in to [facility] [phone]. ALLERGIC TO MEDROL and PREDNISONE.  Reviewed Health History In EMR: Yes  Reviewed Medications In EMR: Yes  Reviewed Allergies In EMR: Yes  Reviewed Surgeries / Procedures: Yes  Date of Onset of Symptoms: 07/09/2012  Treatments Tried: Hydrocortisone, Benadryl, Calamine, Epsom salt baths  Treatments Tried Worked: No OB / GYN:  LMP: Unknown  Guideline(s) Used:  Poison [place], [place], and [address]  Poison [place] - [place] or [facility]  Disposition Per Guideline:   See Today in Office  Reason For Disposition Reached:   Rash involves more than one fourth of the body  Advice Given:  Call Back If:  Rash lasts longer than 3 weeks  It looks infected  You become worse.  Patient Refused Recommendation:  Patient Requests Prescription  Concerned about icy roads.Declined appointment. Requesting a prescription for Hydrocortisone ce [facility] to [facility] [phone]. Is allergic to PREDNISONE , SYNTHETIC CORTISONE and MEDROL

## 2012-07-18 ENCOUNTER — Other Ambulatory Visit: Payer: Self-pay | Admitting: Obstetrics and Gynecology

## 2012-07-18 LAB — CBC
Hemoglobin: 15 g/dL (ref 12.0–15.0)
MCH: 30.8 pg (ref 26.0–34.0)
RBC: 4.87 MIL/uL (ref 3.87–5.11)

## 2012-07-18 LAB — TSH: TSH: 2.486 u[IU]/mL (ref 0.350–4.500)

## 2012-07-23 ENCOUNTER — Encounter: Payer: Self-pay | Admitting: Internal Medicine

## 2012-07-23 MED ORDER — ESCITALOPRAM OXALATE 10 MG PO TABS: 10.0000 mg | ORAL_TABLET | Freq: Every day | ORAL | Status: DC

## 2012-08-20 ENCOUNTER — Other Ambulatory Visit (HOSPITAL_BASED_OUTPATIENT_CLINIC_OR_DEPARTMENT_OTHER): Payer: 59 | Admitting: Lab

## 2012-08-20 DIAGNOSIS — Z853 Personal history of malignant neoplasm of breast: Secondary | ICD-10-CM

## 2012-08-20 LAB — CBC WITH DIFFERENTIAL/PLATELET
EOS%: 3.4 % (ref 0.0–7.0)
MCH: 30.3 pg (ref 25.1–34.0)
MCHC: 33.7 g/dL (ref 31.5–36.0)
MCV: 90 fL (ref 79.5–101.0)
MONO%: 7.6 % (ref 0.0–14.0)
RBC: 4.72 10*6/uL (ref 3.70–5.45)
RDW: 13 % (ref 11.2–14.5)

## 2012-08-20 LAB — COMPREHENSIVE METABOLIC PANEL (CC13)
AST: 21 U/L (ref 5–34)
Albumin: 3.7 g/dL (ref 3.5–5.0)
Alkaline Phosphatase: 72 U/L (ref 40–150)
BUN: 12.6 mg/dL (ref 7.0–26.0)
Potassium: 4.3 mEq/L (ref 3.5–5.1)
Sodium: 142 mEq/L (ref 136–145)
Total Protein: 6.8 g/dL (ref 6.4–8.3)

## 2012-08-27 ENCOUNTER — Telehealth: Payer: Self-pay | Admitting: *Deleted

## 2012-08-27 ENCOUNTER — Ambulatory Visit (HOSPITAL_BASED_OUTPATIENT_CLINIC_OR_DEPARTMENT_OTHER): Payer: 59 | Admitting: Oncology

## 2012-08-27 VITALS — BP 124/77 | HR 70 | Temp 98.4°F | Resp 20 | Ht 67.0 in | Wt 136.5 lb

## 2012-08-27 DIAGNOSIS — Z853 Personal history of malignant neoplasm of breast: Secondary | ICD-10-CM

## 2012-08-27 MED ORDER — TAMOXIFEN CITRATE 10 MG PO TABS: 5.0000 mg | ORAL_TABLET | Freq: Every day | ORAL | Status: DC

## 2012-08-27 NOTE — Progress Notes (Signed)
ID: BAYLEI SIEBELS   DOB: [birth year]  MR#: [id number]  OZH#:[id number]  PCP: Illene Regulus, MD GYN: Silverio Lay SU: OTHER MD: Narda Bonds  HISTORY OF PRESENT ILLNESS: [name] is followed very closely mammographically because of a strong family history of breast cancer which is detailed below.  She had a right breast ultrasound in January 2008 for follow up of a right breast fibroadenoma (this found no significant change as compared to January 2007 or July 2007).    On 11-08-06 she had bilateral diagnostic mammograms and ultrasounds.  She does have dense parenchyma on the right.  There were no findings suspicious for malignancy and the fibroadenoma which was being followed was again found to be unchanged.  In the left breast there were two smoothly  marginated masses which were new compared to prior and they were both found to be cysts by ultrasound.  Because of the patient's high baseline cancer risk, bilateral MRI's were again obtained on 11-25-06 (the prior MRI I believe had been two years before).  Again, the likely fibroadenoma in the lateral aspect of the right breast was noted.  More importantly, a 1.1 cm. enhancing nodule in the inferior aspect of the right breast appeared slightly larger than previously with the suggestion of very minimal margin irregularity.  There were no abnormal appearing lymph nodes and incidentally no liver abnormality was noted.  Because of this change, the focused right breast ultrasound was obtained on  11-28-06.  This showed a 1.4 cm. hypoechoic area where the MRI abnormality was found however, a little bit more laterally than expected, and for that reason MRI biopsy was preferred.  This was performed on 01-20-07 and showed ([id number]) a high grade ductal carcinoma in situ which was 99% ER and 98% PR positive. Her subsequent history is as detailed below  INTERVAL HISTORY: [name] returns today for followup of her breast cancer. The interval history is  unremarkable. Her husband Onalee Hua is now working part-time with the stroke team at [employer]. 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  REVIEW OF SYSTEMS: She had gone off tamoxifen for 6 months and felt fine, then try to go back on it to 10 mg. Initially she did well but then all her symptoms came back. This includes moderate to severe fatigue, ringing in her ear, which has been evaluated by Narda Bonds and found to be related to a mid range hearing loss, cysts in her left breast which can be tender, joint pain which is very diffuse, forgetfulness, and of course hot flashes. More recently she tried to cut back the tamoxifen to 5 mg daily, with very minimal improvement in any of these problems. A detailed review of systems was otherwise noncontributory.  PAST MEDICAL HISTORY: Past Medical History  Diagnosis Date  . Cancer 2008    Breast  . Congenital anomaly of aortic arch   . Unspecified hereditary and idiopathic peripheral neuropathy   . Labyrinthitis, unspecified   . Unspecified hearing loss   . Unspecified tinnitus   . Malignant neoplasm of breast (female), unspecified site   . Other B-complex deficiencies   . Other malaise and fatigue   . Adhesive capsulitis of shoulder   1. Removal of a left breast fibroadenoma in 1980. 2. History of endometriosis diagnosed by laparoscopy I believe in 2003. 3. History of right endometrial artery embolization for fibroid control by Interventional Radiology in April 2008. 4. History of basal cell carcinoma removed from the area  beneath the left orbit, status post Mohs surgery.  5. History of adhesive capsulitis of both shoulders. 6. History of an ascending aortic aneurysm measuring 3.7 cm.  PAST SURGICAL HISTORY: Past Surgical History  Procedure Laterality Date  . Breast lumpectomy  Oct.29 & Mar 19, 2007    Right  . Breast fibroadenoma surgery  1981    Left  . Wisdom teeth extracted    . Laparoscopy  1982     for endometriosis  . Abdominal hysterectomy  04/2010    robotic  . Basal cell carcinoma excision  2003    rt eye area  s/p TAH-BSO 05/11/2010  FAMILY HISTORY Family History  Problem Relation Age of Onset  . Seizures Mother   . Migraines Mother   . Hypertension Father   . Other Father   . Hyperlipidemia Father   . Cancer Sister     breast  . Cancer Other     breast  The patient has one full sister who was diagnosed with breast cancer at the age of 5.  She is now 86 and doing very well.  She has a half brother who is 71.  The patient's mother committed suicide at the age of 1.  She did have fibrocystic change but, of course, we would not know if she had lived to develop breast cancer.  The patient's mother's mother however, died from breast cancer at the age of 10.  It had been diagnosed when she was 48.  That maternal grandmother had an additional six sisters, one of whom also had breast cancer, the patient does not know at what age.  On the patient's father's side, the father had two sisters, one of them developed breast cancer in her 15s and apparently died from metastatic breast cancer. There is a history of stomach cancer on her father's side and of bladder cancer on her mother's side, but this is now two degrees removed.  There is no history of ovarian cancer anywhere in the family to the patient's knowledge.  GYNECOLOGIC HISTORY: She is GX P0. She is s/p TAH BSO as of January 2012, with benign pathology  SOCIAL HISTORY: She is a Public house manager.  She is married to [name] who works as a Building services engineer.  They have six cats.  She is not a church attender but describes herself as a very spiritual person.     ADVANCED DIRECTIVES:  HEALTH MAINTENANCE: History  Substance Use Topics  . Smoking status: Never Smoker   . Smokeless tobacco: Never Used  . Alcohol Use: No     Comment: wine with drinner a few nights a week     Colonoscopy:  PAP:  Bone density:  Lipid  panel:  Allergies  Allergen Reactions  . Polysorbate Anaphylaxis    "Polysorbate 80 preservative"  . Iohexol      Desc: Pt states that she developed itching after receiving Magnevist-MRI contrast.   . Methylprednisolone     Current Outpatient Prescriptions  Medication Sig Dispense Refill  . ALPRAZolam (NIRAVAM) 0.25 MG dissolvable tablet DISSOLVE 1 TABLET BY MOUTH TWICE A DAY AS NEEDED FOR ANXIETY  30 tablet  1  . Calcium-Magnesium-Vitamin D (CALCIUM MAGNESIUM PO) Take by mouth daily. 4 tablets daily       . Cholecalciferol (VITAMIN D) 1000 UNITS capsule Take 1,000 Units by mouth daily.        . ciprofloxacin (CIPRO) 500 MG tablet Take 1 tablet (500 mg total) by mouth 2 (two) times  daily.  10 tablet  0  . escitalopram (LEXAPRO) 10 MG tablet Take 1 tablet (10 mg total) by mouth daily.  30 tablet  5  . fish oil-omega-3 fatty acids 1000 MG capsule Take 2 g by mouth daily.        Marland Kitchen gabapentin (NEURONTIN) 100 MG capsule Take 1 capsule (100 mg total) by mouth 2 (two) times daily as needed (pain).  60 capsule  5  . Ginger Root POWD 2 g by Does not apply route daily.        . multivitamin (THERAGRAN) per tablet Take 1 tablet by mouth daily.        . Psyllium (METAMUCIL PO) Take by mouth.        . tamoxifen (NOLVADEX) 10 MG tablet Take 1 tablet (10 mg total) by mouth daily.  30 tablet  6  . TURMERIC PO Take by mouth.        Marland Kitchen VITAMIN C, CALCIUM ASCORBATE, PO Take 1 tablet by mouth 2 (two) times daily.         No current facility-administered medications for this visit.    OBJECTIVE: Middle-aged white woman in no acute distress Filed Vitals:   08/27/12 0930  BP: 124/77  Pulse: 70  Temp: 98.4 F (36.9 C)  Resp: 20     Body mass index is 21.37 kg/(m^2).    ECOG FS: 0 Filed Weights   08/27/12 0930  Weight: 136 lb 8 oz (61.916 kg)   Sclerae unicteric Oropharynx clear No peripheral adenopathy Lungs no rales or rhonchi Heart regular rate and rhythm Abd soft, nontender, with positive  bowel sounds MSK no focal spinal tenderness, no peripheral edema Neuro: nonfocal, well oriented, pleasant affect Breasts: the right breast is status post lumpectomy. There is no evidence of local recurrence. The left breast is unremarkable. I do not palpate any masses or cysts.Jimmye Norman are benign bilaterally  LAB RESULTS: Lab Results  Component Value Date   WBC 4.6 08/20/2012   NEUTROABS 2.5 08/20/2012   HGB 14.3 08/20/2012   HCT 42.5 08/20/2012   MCV 90.0 08/20/2012   PLT 282 08/20/2012      Chemistry      Component Value Date/Time   NA 142 08/20/2012 0809   NA 141 08/14/2011 1016   K 4.3 08/20/2012 0809   K 4.3 08/14/2011 1016   CL 105 08/20/2012 0809   CL 107 08/14/2011 1016   CO2 28 08/20/2012 0809   CO2 25 08/14/2011 1016   BUN 12.6 08/20/2012 0809   BUN 14 08/14/2011 1016   CREATININE 0.8 08/20/2012 0809   CREATININE 0.74 08/14/2011 1016      Component Value Date/Time   CALCIUM 8.8 08/20/2012 0809   CALCIUM 8.8 08/14/2011 1016   ALKPHOS 72 08/20/2012 0809   ALKPHOS 61 08/14/2011 1016   AST 21 08/20/2012 0809   AST 21 08/14/2011 1016   ALT 15 08/20/2012 0809   ALT 13 08/14/2011 1016   BILITOT 0.35 08/20/2012 0809   BILITOT 0.3 08/14/2011 1016       Lab Results  Component Value Date   LABCA2 38 11/06/2010     STUDIES: No results found.  ASSESSMENT: 52 y.o. [place] woman   (1)  status post right lumpectomy and sentinel lymph node biopsy October of 2008 for a 7 mm grade 1 invasive ductal carcinoma, strongly estrogen and progesterone receptor positive, HER2 negative, with an Oncotype recurrence score of 20, predicting a recurrence of 13% after 5 years on tamoxifen,   (  2)  status post Abraxane given weekly x8 completed February 2009, followed by radiation completed April 2009, at which time she started tamoxifen.    (3)  She has also tried aromatase inhibitors, namely letrozole and exemestane, with no success.  She resumed tamoxifen October 2012, with poor tolerance; held it for  six months, then tried again October 2013, finally went off it permanently April 2014  PLAN:  [name] has made heroic efforts to take anti-estrogens, but they just do not agree with her. Accordingly I went back to the adjuvant! program and calculated her baseline risk of death and a relapse if she did local treatment only (surgery and radiation). The risk of relapse under those circumstances would have been 15%. Chemotherapy would reduce that by 5% and then if she took anti-estrogens for 5 years there would've been a further 4% risk reduction. Her risk of death from this cancer with local treatment only is in the 1% range. Clearly neither chemotherapy in an antiestrogen is would make much of a difference there.  With this in mind and especially now that she is 5 on a half years out from her definitive surgery, with no evidence of disease recurrence, I am very comfortable stopping the tamoxifen. She is interested in using vaginal estrogens. She understands that there will be some systemic absorbency, although minimal. In women who do not have breast cancer estrogen does not cause breast cancer. Unfortunately we do not have similar data in women with a history of breast cancer. However taking it all in all I would not be uncomfortable with your using vaginal estrogens for quality of life purposes. She has a very good understanding of all this.  Accordingly she is going off tamoxifen at this point. She will see me again in one year. She knows to call for any problems that may develop before the next visit.      Fran Mcree C    08/27/2012

## 2012-08-27 NOTE — Telephone Encounter (Signed)
sw pt gv appts d/t for 08/27/13. Made pt aware that i will also mail a cal...td

## 2012-09-18 ENCOUNTER — Other Ambulatory Visit: Payer: Self-pay | Admitting: Oncology

## 2012-09-18 DIAGNOSIS — Z853 Personal history of malignant neoplasm of breast: Secondary | ICD-10-CM

## 2012-09-18 DIAGNOSIS — Z9889 Other specified postprocedural states: Secondary | ICD-10-CM

## 2012-10-01 ENCOUNTER — Ambulatory Visit
Admission: RE | Admit: 2012-10-01 | Discharge: 2012-10-01 | Disposition: A | Discharge status: Home / Self Care | Payer: 59 | Source: Ambulatory Visit | Attending: Oncology | Admitting: Oncology

## 2012-10-01 DIAGNOSIS — Z853 Personal history of malignant neoplasm of breast: Secondary | ICD-10-CM

## 2012-10-01 DIAGNOSIS — Z9889 Other specified postprocedural states: Secondary | ICD-10-CM

## 2012-10-21 ENCOUNTER — Encounter: Payer: Self-pay | Admitting: Internal Medicine

## 2012-10-23 ENCOUNTER — Other Ambulatory Visit: Payer: Self-pay | Admitting: Internal Medicine

## 2012-10-23 MED ORDER — VENLAFAXINE HCL ER 37.5 MG PO CP24: 37.5000 mg | ORAL_CAPSULE | Freq: Every day | ORAL | Status: DC

## 2013-01-25 ENCOUNTER — Encounter: Payer: Self-pay | Admitting: Internal Medicine

## 2013-01-26 MED ORDER — CYCLOBENZAPRINE HCL 5 MG PO TABS: 5.0000 mg | ORAL_TABLET | Freq: Three times a day (TID) | ORAL | Status: DC | PRN

## 2013-02-18 ENCOUNTER — Ambulatory Visit (INDEPENDENT_AMBULATORY_CARE_PROVIDER_SITE_OTHER): Payer: 59 | Admitting: Internal Medicine

## 2013-02-18 ENCOUNTER — Encounter: Payer: Self-pay | Admitting: Internal Medicine

## 2013-02-18 VITALS — BP 130/78 | HR 90 | Temp 97.9°F | Wt 139.8 lb

## 2013-02-18 DIAGNOSIS — M79609 Pain in unspecified limb: Secondary | ICD-10-CM

## 2013-02-18 DIAGNOSIS — R209 Unspecified disturbances of skin sensation: Secondary | ICD-10-CM

## 2013-02-18 MED ORDER — PREDNISONE 5 MG PO TABS: 5.0000 mg | ORAL_TABLET | ORAL | Status: DC

## 2013-02-18 NOTE — Progress Notes (Signed)
  Subjective:    Patient ID: [name], female    DOB: 1961-01-28, 52 y.o.   MRN: [id number]  HPI Holding "PLank" for 5 min and this lead to right shoulder pain. Did [place] the airport worsening her shoulder pain. She has seen chiro, deep pressure with device, has seen massage therapist and had SO do some pressure release. She has identified a problem with rhomboids and scalenes and now with parathesia 1st-3rd digit.   PMH, FamHx and SocHx reviewed for any changes and relevance.  Current Outpatient Prescriptions on File Prior to Visit  Medication Sig Dispense Refill  . ALPRAZolam (NIRAVAM) 0.25 MG dissolvable tablet DISSOLVE 1 TABLET BY MOUTH TWICE A DAY AS NEEDED FOR ANXIETY  30 tablet  1  . Calcium-Magnesium-Vitamin D (CALCIUM MAGNESIUM PO) Take by mouth daily. 4 tablets daily       . Cholecalciferol (VITAMIN D) 1000 UNITS capsule Take 1,000 Units by mouth daily.        . cyclobenzaprine (FLEXERIL) 5 MG tablet Take 1 tablet (5 mg total) by mouth 3 (three) times daily as needed for muscle spasms.  30 tablet  1  . fish oil-omega-3 fatty acids 1000 MG capsule Take 2 g by mouth daily.        . Ginger Root POWD 2 g by Does not apply route daily.        . multivitamin (THERAGRAN) per tablet Take 1 tablet by mouth daily.        . Psyllium (METAMUCIL PO) Take by mouth.        . TURMERIC PO Take by mouth.        . venlafaxine XR (EFFEXOR XR) 37.5 MG 24 hr capsule Take 1 capsule (37.5 mg total) by mouth daily.  30 capsule  11  . VITAMIN C, CALCIUM ASCORBATE, PO Take 1 tablet by mouth 2 (two) times daily.         No current facility-administered medications on file prior to visit.     Review of Systems System review is negative for any constitutional, cardiac, pulmonary, GI or neuro symptoms or complaints other than as described in the HPI.     Objective:   Physical Exam Filed Vitals:   02/18/13 1110  BP: 130/78  Pulse: 90  Temp: 97.9 F (36.6 C)   Wt Readings from  Last 3 Encounters:  02/18/13 139 lb 12.8 oz (63.413 kg)  08/27/12 136 lb 8 oz (61.916 kg)  05/08/12 135 lb (61.236 kg)   BP Readings from Last 3 Encounters:  02/18/13 130/78  08/27/12 124/77  05/28/12 120/82   Gen'l- WNWD woman in no distress Cor- RRR Pulm - normal  MSK - negative Tinel's and Phalen's, holding arms up lead to paresthesia. Neck with full ROM       Assessment & Plan:  Paresthesia - numbness and tingling: this is rarely from peripheral compression of a nerve, i.e. In the axilla in the region of the pec minor/major. Negative Phalen's sign - not carpal tunnel. Reviewed anatomy pictures and nerve distributions.  Plan  continue with heat, massage, Range of motion  For continued paresthesia, especially if it gets worse, will need to look at the neck and the cervical nerves  Trial of low prednisone: 20 mg a day for 3 days, 10 mg a day for 3 days and then 5 mg daily for 6

## 2013-02-18 NOTE — Patient Instructions (Signed)
1. Paresthesia - numbness and tingling: this is rarely from peripheral compression of a nerve, i.e. In the axilla in the region of the pec minor/major. Negative Phalen's sign - not carpal tunnel.  Plan  continue with heat, massage, Range of motion  For continued paresthesia, especially if it gets worse, will need to look at the neck and the cervical nerves  Trial of low prednisone: 20 mg a day for 3 days, 10 mg a day for 3 days and then 5 mg daily for 6

## 2013-02-21 ENCOUNTER — Encounter: Payer: Self-pay | Admitting: Internal Medicine

## 2013-03-04 ENCOUNTER — Other Ambulatory Visit: Payer: Self-pay | Admitting: Internal Medicine

## 2013-03-05 ENCOUNTER — Other Ambulatory Visit: Payer: Self-pay

## 2013-05-27 ENCOUNTER — Other Ambulatory Visit: Payer: Self-pay | Admitting: Internal Medicine

## 2013-05-27 ENCOUNTER — Encounter: Payer: Self-pay | Admitting: Internal Medicine

## 2013-05-27 MED ORDER — PREDNISONE 5 MG PO TABS: 5.0000 mg | ORAL_TABLET | ORAL | Status: DC

## 2013-05-27 MED ORDER — VENLAFAXINE HCL ER 37.5 MG PO CP24: 37.5000 mg | ORAL_CAPSULE | Freq: Every day | ORAL | Status: DC

## 2013-05-27 MED ORDER — ESCITALOPRAM OXALATE 5 MG PO TABS: 5.0000 mg | ORAL_TABLET | Freq: Every day | ORAL | Status: DC

## 2013-05-27 NOTE — Addendum Note (Signed)
Addended by: Neena Rhymes on: 05/27/2013 07:18 PM   Modules accepted: Orders, Medications

## 2013-07-02 ENCOUNTER — Ambulatory Visit
Admission: RE | Admit: 2013-07-02 | Discharge: 2013-07-02 | Disposition: A | Discharge status: Home / Self Care | Payer: 59 | Source: Ambulatory Visit | Attending: Oncology | Admitting: Oncology

## 2013-07-02 ENCOUNTER — Telehealth: Payer: Self-pay | Admitting: *Deleted

## 2013-07-02 DIAGNOSIS — N6453 Retraction of nipple: Secondary | ICD-10-CM

## 2013-07-02 DIAGNOSIS — Z872 Personal history of diseases of the skin and subcutaneous tissue: Secondary | ICD-10-CM

## 2013-07-02 DIAGNOSIS — Z853 Personal history of malignant neoplasm of breast: Secondary | ICD-10-CM

## 2013-07-02 NOTE — Telephone Encounter (Signed)
This RN spoke with pt per her call stating onset of increased left breast changes noted yesterday.  [name] states she has known cyst in left breast but now has increased nipple retraction, dimpling, itching and skin hardening in the left breast.  This RN obtained an appt for diagnostic left mammogram with U/S for today.  Pt aware.  No other needs at this time.

## 2013-07-09 ENCOUNTER — Other Ambulatory Visit: Payer: Self-pay | Admitting: Oncology

## 2013-07-09 DIAGNOSIS — C50919 Malignant neoplasm of unspecified site of unspecified female breast: Secondary | ICD-10-CM

## 2013-07-13 ENCOUNTER — Telehealth: Payer: Self-pay | Admitting: Oncology

## 2013-07-13 NOTE — Telephone Encounter (Signed)
order entered 3/12 for mri of breast @ [facility]. s/w pt and gv her info to call [facility] for appt. procedure for breast mri @ [facility] is for pt to call in for appt once order placed and office will schedule appt w/pt. no other orders per 3/12 pof.

## 2013-07-14 ENCOUNTER — Other Ambulatory Visit: Payer: Self-pay | Admitting: *Deleted

## 2013-07-19 ENCOUNTER — Ambulatory Visit
Admission: RE | Admit: 2013-07-19 | Discharge: 2013-07-19 | Disposition: A | Discharge status: Home / Self Care | Payer: 59 | Source: Ambulatory Visit | Attending: Oncology | Admitting: Oncology

## 2013-07-19 DIAGNOSIS — C50919 Malignant neoplasm of unspecified site of unspecified female breast: Secondary | ICD-10-CM

## 2013-07-19 MED ORDER — GADOBENATE DIMEGLUMINE 529 MG/ML IV SOLN
12.0000 mL | Freq: Once | INTRAVENOUS | Status: AC | PRN
  Administered 2013-07-19: 12 mL via INTRAVENOUS

## 2013-07-20 ENCOUNTER — Other Ambulatory Visit: Payer: Self-pay | Admitting: Obstetrics and Gynecology

## 2013-07-20 DIAGNOSIS — R109 Unspecified abdominal pain: Secondary | ICD-10-CM

## 2013-07-23 ENCOUNTER — Other Ambulatory Visit: Payer: Self-pay

## 2013-07-23 ENCOUNTER — Ambulatory Visit
Admission: RE | Admit: 2013-07-23 | Discharge: 2013-07-23 | Disposition: A | Discharge status: Home / Self Care | Payer: 59 | Source: Ambulatory Visit | Attending: Obstetrics and Gynecology | Admitting: Obstetrics and Gynecology

## 2013-07-23 DIAGNOSIS — R109 Unspecified abdominal pain: Secondary | ICD-10-CM

## 2013-07-23 MED ORDER — IOHEXOL 300 MG/ML  SOLN
100.0000 mL | Freq: Once | INTRAMUSCULAR | Status: AC | PRN
  Administered 2013-07-23: 100 mL via INTRAVENOUS

## 2013-07-26 ENCOUNTER — Encounter: Payer: Self-pay | Admitting: Oncology

## 2013-07-30 ENCOUNTER — Telehealth: Payer: Self-pay | Admitting: *Deleted

## 2013-07-30 NOTE — Telephone Encounter (Signed)
Patient called for results of breast MRI, which shows no signs/evidence of cancer. Patient to continue with annual diagnostic mammograms.

## 2013-08-20 ENCOUNTER — Other Ambulatory Visit (HOSPITAL_BASED_OUTPATIENT_CLINIC_OR_DEPARTMENT_OTHER): Payer: 59

## 2013-08-20 DIAGNOSIS — Z853 Personal history of malignant neoplasm of breast: Secondary | ICD-10-CM

## 2013-08-20 DIAGNOSIS — C50419 Malignant neoplasm of upper-outer quadrant of unspecified female breast: Secondary | ICD-10-CM

## 2013-08-20 LAB — COMPREHENSIVE METABOLIC PANEL (CC13)
ALBUMIN: 4.2 g/dL (ref 3.5–5.0)
ALK PHOS: 73 U/L (ref 40–150)
ALT: 12 U/L (ref 0–55)
AST: 19 U/L (ref 5–34)
Anion Gap: 11 mEq/L (ref 3–11)
BILIRUBIN TOTAL: 0.56 mg/dL (ref 0.20–1.20)
BUN: 20.7 mg/dL (ref 7.0–26.0)
CO2: 25 mEq/L (ref 22–29)
Calcium: 9.3 mg/dL (ref 8.4–10.4)
Chloride: 106 mEq/L (ref 98–109)
Creatinine: 0.8 mg/dL (ref 0.6–1.1)
GLUCOSE: 98 mg/dL (ref 70–140)
POTASSIUM: 4 meq/L (ref 3.5–5.1)
SODIUM: 142 meq/L (ref 136–145)
Total Protein: 7.1 g/dL (ref 6.4–8.3)

## 2013-08-20 LAB — CBC WITH DIFFERENTIAL/PLATELET
BASO%: 0.8 % (ref 0.0–2.0)
Basophils Absolute: 0.1 10*3/uL (ref 0.0–0.1)
EOS ABS: 0.2 10*3/uL (ref 0.0–0.5)
EOS%: 3.3 % (ref 0.0–7.0)
HCT: 43.8 % (ref 34.8–46.6)
HGB: 14.8 g/dL (ref 11.6–15.9)
LYMPH%: 26.9 % (ref 14.0–49.7)
MCH: 30.8 pg (ref 25.1–34.0)
MCHC: 33.7 g/dL (ref 31.5–36.0)
MCV: 91.3 fL (ref 79.5–101.0)
MONO#: 0.5 10*3/uL (ref 0.1–0.9)
MONO%: 7.1 % (ref 0.0–14.0)
NEUT%: 61.9 % (ref 38.4–76.8)
NEUTROS ABS: 4.2 10*3/uL (ref 1.5–6.5)
Platelets: 292 10*3/uL (ref 145–400)
RBC: 4.8 10*6/uL (ref 3.70–5.45)
RDW: 12.8 % (ref 11.2–14.5)
WBC: 6.7 10*3/uL (ref 3.9–10.3)
lymph#: 1.8 10*3/uL (ref 0.9–3.3)

## 2013-08-24 ENCOUNTER — Telehealth: Payer: Self-pay | Admitting: *Deleted

## 2013-08-24 NOTE — Telephone Encounter (Signed)
Pt will see Dr. Andria Frames on 08/27/13 at 1000.  Confirmed appt date and time.

## 2013-08-27 ENCOUNTER — Ambulatory Visit: Payer: 59 | Admitting: Oncology

## 2013-08-27 ENCOUNTER — Ambulatory Visit (HOSPITAL_BASED_OUTPATIENT_CLINIC_OR_DEPARTMENT_OTHER): Payer: 59 | Admitting: Hematology and Oncology

## 2013-08-27 ENCOUNTER — Telehealth: Payer: Self-pay | Admitting: Hematology and Oncology

## 2013-08-27 VITALS — BP 122/69 | HR 65 | Temp 97.9°F | Resp 18 | Ht 67.0 in | Wt 140.1 lb

## 2013-08-27 DIAGNOSIS — F411 Generalized anxiety disorder: Secondary | ICD-10-CM

## 2013-08-27 DIAGNOSIS — F419 Anxiety disorder, unspecified: Secondary | ICD-10-CM

## 2013-08-27 DIAGNOSIS — Z853 Personal history of malignant neoplasm of breast: Secondary | ICD-10-CM

## 2013-08-27 MED ORDER — ALPRAZOLAM 0.25 MG PO TBDP: 0.2500 mg | ORAL_TABLET | Freq: Three times a day (TID) | ORAL | Status: DC | PRN

## 2013-08-27 NOTE — Telephone Encounter (Signed)
, °

## 2013-08-29 NOTE — Progress Notes (Signed)
ID: PORSHE FLEAGLE   DOB: 13-Nov-1960  MR#: [id number]  KPT#:[id number]  PCP: Adella Hare, MD GYN: Delsa Bern SU: OTHER MD: Radene Journey  Chief complaint: Follow up visit for breast cancer  HISTORY OF PRESENT ILLNESS: As per previously dictated Dr.Magrinat's Note:  [name] is followed very closely mammographically because of a strong family history of breast cancer which is detailed below.  She had a right breast ultrasound in January 2008 for follow up of a right breast fibroadenoma (this found no significant change as compared to January 2007 or July 2007).    On 11-08-06 she had bilateral diagnostic mammograms and ultrasounds.  She does have dense parenchyma on the right.  There were no findings suspicious for malignancy and the fibroadenoma which was being followed was again found to be unchanged.  In the left breast there were two smoothly  marginated masses which were new compared to prior and they were both found to be cysts by ultrasound.  Because of the patient's high baseline cancer risk, bilateral MRI's were again obtained on 11-25-06 (the prior MRI I believe had been two years before).  Again, the likely fibroadenoma in the lateral aspect of the right breast was noted.  More importantly, a 1.1 cm. enhancing nodule in the inferior aspect of the right breast appeared slightly larger than previously with the suggestion of very minimal margin irregularity.  There were no abnormal appearing lymph nodes and incidentally no liver abnormality was noted.  Because of this change, the focused right breast ultrasound was obtained on  11-28-06.  This showed a 1.4 cm. hypoechoic area where the MRI abnormality was found however, a little bit more laterally than expected, and for that reason MRI biopsy was preferred.  This was performed on 01-20-07 and showed ([id number]) a high grade ductal carcinoma in situ which was 99% ER and 98% PR positive. Her subsequent history is as detailed  below  INTERVAL HISTORY: [name] is a 53 years old and pleasant lady with history of breast cancer is here for followup visit. Since the time of last visit she is off of tamoxifen. She complains of intermittent hot flashes,  joint pains and hair loss. She says her  neuropathy is improving. She also complains of intermittent sudden episodes of anxiety(stressors in the past) and she says her anxiety gets better when she takes Xanax as needed. She also complains of bilateral breast tenderness more on left side. An MRI performed in March 2015 revealed no MR evidence of malignancy, postlumpectomy scarring is noted in the lower right breast and stable  ectasia of the ascending thoracic Aorta and was recommended to have MRI every other year. Digital diagnostic left mammogram with CAD and ultrasound of left breast performed in March 2015 revealed no mammographic  or sonographic evidence of malignancy and  benign cyst in the upper outer left breast is noted She had CT of the abdomen and pelvis done in March 2015 in  view of pelvic / lower back pain and that revealed no evidence of metastasis   REVIEW OF SYSTEMS: A 10 point review of symptoms were assessed and and pertinent symptoms were mentioned in interval history    PAST MEDICAL HISTORY: Past Medical History  Diagnosis Date  . Cancer 2008    Breast  . Congenital anomaly of aortic arch   . Unspecified hereditary and idiopathic peripheral neuropathy   . Labyrinthitis, unspecified   . Unspecified hearing loss   . Unspecified tinnitus   . Malignant  neoplasm of breast (female), unspecified site   . Other B-complex deficiencies   . Other malaise and fatigue   . Adhesive capsulitis of shoulder   1. Removal of a left breast fibroadenoma in 1980. 2. History of endometriosis diagnosed by laparoscopy I believe in 2003. 3. History of right endometrial artery embolization for fibroid control by Interventional Radiology in April 2008. 4. History of basal  cell carcinoma removed from the area beneath the left orbit, status post Mohs surgery.  5. History of adhesive capsulitis of both shoulders. 6. History of an ascending aortic aneurysm measuring 3.7 cm.  PAST SURGICAL HISTORY: Past Surgical History  Procedure Laterality Date  . Breast lumpectomy  Oct.29 & Mar 19, 2007    Right  . Breast fibroadenoma surgery  1981    Left  . Wisdom teeth extracted    . Laparoscopy  1982    for endometriosis  . Abdominal hysterectomy  04/2010    robotic  . Basal cell carcinoma excision  2003    rt eye area  s/p TAH-BSO 05/11/2010  FAMILY HISTORY Family History  Problem Relation Age of Onset  . Seizures Mother   . Migraines Mother   . Hypertension Father   . Other Father   . Hyperlipidemia Father   . Cancer Sister     breast  . Cancer Other     breast  The patient has one full sister who was diagnosed with breast cancer at the age of 55.  She is now 49 and doing very well.  She has a half brother who is 72.  The patient's mother committed suicide at the age of 47.  She did have fibrocystic change but, of course, we would not know if she had lived to develop breast cancer.  The patient's mother's mother however, died from breast cancer at the age of 53.  It had been diagnosed when she was 69.  That maternal grandmother had an additional six sisters, one of whom also had breast cancer, the patient does not know at what age.  On the patient's father's side, the father had two sisters, one of them developed breast cancer in her 87s and apparently died from metastatic breast cancer. There is a history of stomach cancer on her father's side and of bladder cancer on her mother's side, but this is now two degrees removed.  There is no history of ovarian cancer anywhere in the family to the patient's knowledge.  GYNECOLOGIC HISTORY: She is GX P0. She is s/p TAH BSO as of January 2012, with benign pathology  SOCIAL HISTORY: She is a Dietitian.  She is married to Mary who works as a Engineering geologist.  They have six cats.  She is not a church attender but describes herself as a very spiritual person.     ADVANCED DIRECTIVES:  HEALTH MAINTENANCE: History  Substance Use Topics  . Smoking status: Never Smoker   . Smokeless tobacco: Never Used  . Alcohol Use: No     Comment: wine with drinner a few nights a week     Colonoscopy:  PAP:  Bone density:  Lipid panel:  Allergies  Allergen Reactions  . Polysorbate Anaphylaxis    "Polysorbate 80 preservative"  . Iohexol      Desc: Pt states that she developed itching after receiving Magnevist-MRI contrast.   . Methylprednisolone     Current Outpatient Prescriptions  Medication Sig Dispense Refill  . Calcium-Magnesium-Vitamin D (CALCIUM MAGNESIUM PO) Take  by mouth daily. 4 tablets daily       . Cholecalciferol (VITAMIN D) 1000 UNITS capsule Take 1,000 Units by mouth daily.        Marland Kitchen escitalopram (LEXAPRO) 5 MG tablet Take 1 tablet (5 mg total) by mouth daily.  30 tablet  5  . fish oil-omega-3 fatty acids 1000 MG capsule Take 2 g by mouth daily.        . Ginger Root POWD 2 g by Does not apply route daily.        . multivitamin (THERAGRAN) per tablet Take 1 tablet by mouth daily.        . Psyllium (METAMUCIL PO) Take by mouth.        . TURMERIC PO Take by mouth.        Marland Kitchen VITAMIN C, CALCIUM ASCORBATE, PO Take 1 tablet by mouth 2 (two) times daily.        Marland Kitchen ALPRAZolam (NIRAVAM) 0.25 MG dissolvable tablet Take 1 tablet (0.25 mg total) by mouth 3 (three) times daily as needed for anxiety.  30 tablet  0   No current facility-administered medications for this visit.    OBJECTIVE: Middle-aged white woman in no acute distress Filed Vitals:   08/27/13 1006  BP: 122/69  Pulse: 65  Temp: 97.9 F (36.6 C)  Resp: 18     Body mass index is 21.94 kg/(m^2).    ECOG FS: 0 Filed Weights   08/27/13 1006  Weight: 140 lb 1.6 oz (63.549 kg)   HEENT PERRLA, sclera anicteric,  conjunctiva no pallor, neck supple, no JVD, no thyromegaly  Oropharynx clear No peripheral adenopathy Lungs no rales or rhonchi Heart regular rate and rhythm Abd soft, nontender, with positive bowel sounds MSK no focal spinal tenderness, no peripheral edema Neuro: nonfocal, well oriented, pleasant affect Breasts: the right breast is status post lumpectomy. Left breast inverted nipple. Both the breasts are tender and I did not appreciate any masses in the breasts or in the axilla .    LAB RESULTS: Lab Results  Component Value Date   WBC 6.7 08/20/2013   NEUTROABS 4.2 08/20/2013   HGB 14.8 08/20/2013   HCT 43.8 08/20/2013   MCV 91.3 08/20/2013   PLT 292 08/20/2013      Chemistry      Component Value Date/Time   NA 142 08/20/2013 0805   NA 141 08/14/2011 1016   K 4.0 08/20/2013 0805   K 4.3 08/14/2011 1016   CL 105 08/20/2012 0809   CL 107 08/14/2011 1016   CO2 25 08/20/2013 0805   CO2 25 08/14/2011 1016   BUN 20.7 08/20/2013 0805   BUN 14 08/14/2011 1016   CREATININE 0.8 08/20/2013 0805   CREATININE 0.74 08/14/2011 1016      Component Value Date/Time   CALCIUM 9.3 08/20/2013 0805   CALCIUM 8.8 08/14/2011 1016   ALKPHOS 73 08/20/2013 0805   ALKPHOS 61 08/14/2011 1016   AST 19 08/20/2013 0805   AST 21 08/14/2011 1016   ALT 12 08/20/2013 0805   ALT 13 08/14/2011 1016   BILITOT 0.56 08/20/2013 0805   BILITOT 0.3 08/14/2011 1016       Lab Results  Component Value Date   LABCA2 38 11/06/2010      ASSESSMENT: 53 y.o. [place] woman   (1)  status post right lumpectomy and sentinel lymph node biopsy October of 2008 for a 7 mm grade 1 invasive ductal carcinoma, strongly estrogen and progesterone receptor positive, HER2 negative, with  an Oncotype recurrence score of 20, predicting a recurrence of 13% after 5 years on tamoxifen,   (2)  status post Abraxane given weekly x8 completed February 2009, followed by radiation completed April 2009, at which time she started tamoxifen.    (3)  She has  also tried aromatase inhibitors, namely letrozole and exemestane, with no success.  She resumed tamoxifen October 2012, with poor tolerance; held it for six months, then tried again October 2013, finally went off it permanently April 2014  (4)An MRI performed in March 2015 revealed no MR evidence of malignancy, postlumpectomy scarring is noted in the lower right breast and stable  ectasia of the ascending thoracic Aorta and was recommended to have MRI every other year.  (5)Digital diagnostic left mammogram with CAD and ultrasound of left breast performed in March 2015 revealed no mammographic  or sonographic evidence of malignancy and  benign cyst in the upper outer left breast is noted  (6) She had CT of the abdomen and pelvis done in March 2015 in  view of pelvic / lower back pain and that revealed no evidence of metastasis   PLAN:  I have reviewed MRI results, mammogram report and also CAT scan of the abdomen and pelvis results with Ms. Sexton. Her CBC and CMP are within normal range.  We will schedule for her bilateral digital diagnostic mammogram in June 2015 Repeat MRI of the breasts to be performed in March 2017 I have prescribed Xanax for intermittent episodes of anxiety since it helped her in the past and referred her to psychiatry for further evaluation and treatment I have encouraged her self breast exams, good diet and exercise Next followup visit  with Dr. Jana Hakim in one year with CBC and differential and CMP   She knows to call for any problems that may develop before the next visit.      Wilmon Arms , M.D. Medical oncology/hematology    08/29/2013

## 2013-08-31 ENCOUNTER — Telehealth: Payer: Self-pay | Admitting: *Deleted

## 2013-08-31 NOTE — Telephone Encounter (Signed)
Left message on pt's cell # to call back in regard to whether she has scheduled her mammogram for June or if we need to schedule for her.

## 2013-09-04 ENCOUNTER — Telehealth: Payer: Self-pay | Admitting: *Deleted

## 2013-09-04 ENCOUNTER — Other Ambulatory Visit: Payer: Self-pay | Admitting: *Deleted

## 2013-09-04 DIAGNOSIS — Z853 Personal history of malignant neoplasm of breast: Secondary | ICD-10-CM

## 2013-09-04 NOTE — Telephone Encounter (Signed)
Called pt & she has been out of town & hadn't received messages left but she would like [place] to schedule her mammogram for her at the [place].  Will send POF to scheduler.

## 2013-09-04 NOTE — Telephone Encounter (Signed)
Message copied by Jesse Fall on Fri Sep 04, 2013  5:40 PM ------      Message from: Wilmon Arms      Created: Sat Aug 29, 2013  8:57 PM       Pl call pt and check if she scheduled for mammogram in June, 2015      Or our office needs to arrange ------

## 2013-09-11 ENCOUNTER — Telehealth: Payer: Self-pay | Admitting: Oncology

## 2013-09-11 NOTE — Telephone Encounter (Signed)
s/w pt re mammo for 10/05/13 and confirmed appts for april 2016.

## 2013-10-05 ENCOUNTER — Ambulatory Visit
Admission: RE | Admit: 2013-10-05 | Discharge: 2013-10-05 | Disposition: A | Discharge status: Home / Self Care | Payer: 59 | Source: Ambulatory Visit | Attending: Hematology and Oncology | Admitting: Hematology and Oncology

## 2013-10-05 DIAGNOSIS — Z853 Personal history of malignant neoplasm of breast: Secondary | ICD-10-CM

## 2014-01-18 ENCOUNTER — Other Ambulatory Visit: Payer: Self-pay

## 2014-01-18 NOTE — Telephone Encounter (Signed)
Former patient of Dr Linda Hedges last saw him on 10.22.14 No up coming appts

## 2014-01-21 ENCOUNTER — Other Ambulatory Visit: Payer: Self-pay | Admitting: Geriatric Medicine

## 2014-01-21 MED ORDER — ESCITALOPRAM OXALATE 5 MG PO TABS: 5.0000 mg | ORAL_TABLET | Freq: Every day | ORAL | Status: DC

## 2014-03-30 ENCOUNTER — Other Ambulatory Visit (INDEPENDENT_AMBULATORY_CARE_PROVIDER_SITE_OTHER): Payer: 59

## 2014-03-30 ENCOUNTER — Ambulatory Visit (INDEPENDENT_AMBULATORY_CARE_PROVIDER_SITE_OTHER): Payer: 59 | Admitting: Internal Medicine

## 2014-03-30 ENCOUNTER — Encounter: Payer: Self-pay | Admitting: Internal Medicine

## 2014-03-30 VITALS — BP 118/68 | HR 76 | Temp 98.5°F | Resp 16 | Ht 67.0 in | Wt 135.0 lb

## 2014-03-30 DIAGNOSIS — Q254 Congenital malformation of aorta unspecified: Secondary | ICD-10-CM

## 2014-03-30 DIAGNOSIS — E785 Hyperlipidemia, unspecified: Secondary | ICD-10-CM

## 2014-03-30 DIAGNOSIS — E7800 Pure hypercholesterolemia, unspecified: Secondary | ICD-10-CM

## 2014-03-30 DIAGNOSIS — E538 Deficiency of other specified B group vitamins: Secondary | ICD-10-CM

## 2014-03-30 DIAGNOSIS — G609 Hereditary and idiopathic neuropathy, unspecified: Secondary | ICD-10-CM

## 2014-03-30 DIAGNOSIS — R0683 Snoring: Secondary | ICD-10-CM

## 2014-03-30 DIAGNOSIS — L659 Nonscarring hair loss, unspecified: Secondary | ICD-10-CM

## 2014-03-30 DIAGNOSIS — R5382 Chronic fatigue, unspecified: Secondary | ICD-10-CM

## 2014-03-30 DIAGNOSIS — F418 Other specified anxiety disorders: Secondary | ICD-10-CM

## 2014-03-30 LAB — T4, FREE: FREE T4: 0.88 ng/dL (ref 0.60–1.60)

## 2014-03-30 LAB — LIPID PANEL
CHOL/HDL RATIO: 5
CHOLESTEROL: 197 mg/dL (ref 0–200)
HDL: 42.5 mg/dL (ref >39.00)
LDL Cholesterol: 126 mg/dL — ABNORMAL HIGH (ref 0–99)
NonHDL: 154.5
Triglycerides: 143 mg/dL (ref 0.0–149.0)
VLDL: 28.6 mg/dL (ref 0.0–40.0)

## 2014-03-30 LAB — CBC
HCT: 41.4 % (ref 36.0–46.0)
HEMOGLOBIN: 13.8 g/dL (ref 12.0–15.0)
MCHC: 33.2 g/dL (ref 30.0–36.0)
MCV: 89.8 fl (ref 78.0–100.0)
Platelets: 316 10*3/uL (ref 150.0–400.0)
RBC: 4.61 Mil/uL (ref 3.87–5.11)
RDW: 12.5 % (ref 11.5–15.5)
WBC: 6.5 10*3/uL (ref 4.0–10.5)

## 2014-03-30 LAB — TSH: TSH: 1.7 u[IU]/mL (ref 0.35–4.50)

## 2014-03-30 LAB — VITAMIN B12: Vitamin B-12: 355 pg/mL (ref 211–911)

## 2014-03-30 LAB — FOLATE: FOLATE: 23 ng/mL (ref >5.9)

## 2014-03-30 LAB — BASIC METABOLIC PANEL
BUN: 17 mg/dL (ref 6–23)
CHLORIDE: 108 meq/L (ref 96–112)
CO2: 24 mEq/L (ref 19–32)
Calcium: 8.8 mg/dL (ref 8.4–10.5)
Creatinine, Ser: 0.8 mg/dL (ref 0.4–1.2)
GFR: 85.18 mL/min (ref >60.00)
GLUCOSE: 86 mg/dL (ref 70–99)
Potassium: 3.9 mEq/L (ref 3.5–5.1)
SODIUM: 141 meq/L (ref 135–145)

## 2014-03-30 LAB — HEMOGLOBIN A1C: Hgb A1c MFr Bld: 5.8 % (ref 4.6–6.5)

## 2014-03-30 MED ORDER — PAROXETINE HCL 10 MG/5ML PO SUSP: 10.0000 mg | Freq: Every day | ORAL | Status: DC

## 2014-03-30 NOTE — Progress Notes (Signed)
Pre visit review using our clinic review tool, if applicable. No additional management support is needed unless otherwise documented below in the visit note. 

## 2014-03-30 NOTE — Progress Notes (Signed)
   Subjective:    Patient ID: [name], female    DOB: 13-Apr-1961, 53 y.o.   MRN: [id number]  HPI  the patient is a 53 year old female who comes in today to establish care. She does have past medical history of breast cancer, B-12 deficiency, borderline elevated cholesterol. She has weaned herself off of her Lexapro because she felt like it was giving her side effects. She has noticed since then her anxiety has been a little bit more and her mood has been a little bit less stable. She also has been having more hot flashes since being off Lexapro. She also is supposed to get a sleep study however that has not been done yet and she wants to make sure that it will be done. She also is concerned about her hair is thinning and she's tried some OTC remedies which have not been effective. She was tried on Dyazide for her tinnitus and dizziness however this medication made her much worse. She stopped taking it yesterday and is starting to feel mildly better today  Review of Systems  Constitutional: Positive for fatigue. Negative for fever, activity change, appetite change and unexpected weight change.  HENT: Negative.   Eyes: Negative.   Respiratory: Negative for cough, chest tightness, shortness of breath and wheezing.   Cardiovascular: Negative for chest pain, palpitations and leg swelling.  Gastrointestinal: Negative for abdominal pain, diarrhea, constipation and abdominal distention.  Musculoskeletal: Positive for myalgias.  Skin: Negative.   Neurological: Positive for dizziness and numbness. Negative for syncope, weakness, light-headedness and headaches.      Objective:   Physical Exam  Constitutional: She is oriented to person, place, and time. She appears well-developed and well-nourished.  HENT:  Head: Normocephalic and atraumatic.  Eyes: EOM are normal.  Neck: Normal range of motion.  Cardiovascular: Normal rate and regular rhythm.   Pulmonary/Chest: Effort normal and breath  sounds normal. No respiratory distress. She has no wheezes. She has no rales.  Abdominal: Soft. Bowel sounds are normal. She exhibits no distension. There is no tenderness. There is no rebound.  Musculoskeletal: She exhibits no edema.  Neurological: She is alert and oriented to person, place, and time.  Area of decreased sensation on the right middle finger  Skin: Skin is warm and dry.   Filed Vitals:   03/30/14 0937  BP: 118/68  Pulse: 76  Temp: 98.5 F (36.9 C)  TempSrc: Oral  Resp: 16  Height: 5\' 7"  (1.702 m)  Weight: 135 lb (61.236 kg)  SpO2: 98%      Assessment & Plan:

## 2014-03-30 NOTE — Patient Instructions (Signed)
We will send you for your sleep study, we will check your blood tests today. We will check on your thyroid as well as other tests. We will have you go to a dermatologist for the hair thinning. They may have some helpful suggestions for OTC remedies you can try as well as if there are any prescription options that may be worthwhile.   You can continue to use the Xanax as you need for your anxiety. We will have you try a medicine called paroxetine 10 mg for the anxiety.   Generalized Anxiety Disorder Generalized anxiety disorder (GAD) is a mental disorder. It interferes with life functions, including relationships, work, and school. GAD is different from normal anxiety, which everyone experiences at some point in their lives in response to specific life events and activities. Normal anxiety actually helps [place] prepare for and get through these life events and activities. Normal anxiety goes away after the event or activity is over.  GAD causes anxiety that is not necessarily related to specific events or activities. It also causes excess anxiety in proportion to specific events or activities. The anxiety associated with GAD is also difficult to control. GAD can vary from mild to severe. People with severe GAD can have intense waves of anxiety with physical symptoms (panic attacks).  SYMPTOMS The anxiety and worry associated with GAD are difficult to control. This anxiety and worry are related to many life events and activities and also occur more days than not for 6 months or longer. People with GAD also have three or more of the following symptoms (one or more in children):  Restlessness.   Fatigue.  Difficulty concentrating.   Irritability.  Muscle tension.  Difficulty sleeping or unsatisfying sleep. DIAGNOSIS GAD is diagnosed through an assessment by your health care provider. Your health care provider will ask you questions aboutyour mood,physical symptoms, and events in your life. Your  health care provider may ask you about your medical history and use of alcohol or drugs, including prescription medicines. Your health care provider may also do a physical exam and blood tests. Certain medical conditions and the use of certain substances can cause symptoms similar to those associated with GAD. Your health care provider may refer you to a mental health specialist for further evaluation. TREATMENT The following therapies are usually used to treat GAD:   Medication. Antidepressant medication usually is prescribed for long-term daily control. Antianxiety medicines may be added in severe cases, especially when panic attacks occur.   Talk therapy (psychotherapy). Certain types of talk therapy can be helpful in treating GAD by providing support, education, and guidance. A form of talk therapy called cognitive behavioral therapy can teach you healthy ways to think about and react to daily life events and activities.  Stress managementtechniques. These include yoga, meditation, and exercise and can be very helpful when they are practiced regularly. A mental health specialist can help determine which treatment is best for you. Some people see improvement with one therapy. However, other people require a combination of therapies. Document Released: 08/11/2012 Document Revised: 08/31/2013 Document Reviewed: 08/11/2012 [facility] Patient Information 2015 [place], [place]. This information is not intended to replace advice given to you by your health care provider. Make sure you discuss any questions you have with your health care provider.

## 2014-03-31 NOTE — Assessment & Plan Note (Signed)
Given her sensory loss in her finger will recheck B-12 level as well as folate level.

## 2014-03-31 NOTE — Assessment & Plan Note (Signed)
Patient had been doing well on Lexapro for some time however discontinued it herself due to having side effects. We will trial paroxetine. Due to her Polysorbate allergy we did need to do the liquid form as the tablet contains polysorbate which she is allergic to polysorbate.

## 2014-03-31 NOTE — Assessment & Plan Note (Signed)
Likely partially due to depression. Will check TSH as well as T4. Will have her start taking paroxetine.

## 2014-03-31 NOTE — Assessment & Plan Note (Signed)
The cholesterol medication. Will check lipid panel.

## 2014-03-31 NOTE — Assessment & Plan Note (Signed)
MRI done May 2015. Recommended to have follow-up every 1-2 years.

## 2014-04-01 ENCOUNTER — Other Ambulatory Visit: Payer: Self-pay | Admitting: Hematology and Oncology

## 2014-04-01 MED ORDER — ALPRAZOLAM 0.25 MG PO TBDP: 0.2500 mg | ORAL_TABLET | Freq: Three times a day (TID) | ORAL | Status: DC | PRN

## 2014-04-05 ENCOUNTER — Other Ambulatory Visit: Payer: Self-pay | Admitting: *Deleted

## 2014-04-05 MED ORDER — TAMOXIFEN CITRATE 20 MG PO TABS: 20.0000 mg | ORAL_TABLET | Freq: Every day | ORAL | Status: DC

## 2014-05-11 ENCOUNTER — Ambulatory Visit (HOSPITAL_BASED_OUTPATIENT_CLINIC_OR_DEPARTMENT_OTHER): Payer: 59 | Attending: Otolaryngology | Admitting: Radiology

## 2014-05-11 VITALS — Ht 67.0 in | Wt 133.0 lb

## 2014-05-11 DIAGNOSIS — G4733 Obstructive sleep apnea (adult) (pediatric): Secondary | ICD-10-CM | POA: Diagnosis present

## 2014-05-11 DIAGNOSIS — R0683 Snoring: Secondary | ICD-10-CM | POA: Insufficient documentation

## 2014-05-15 DIAGNOSIS — G4733 Obstructive sleep apnea (adult) (pediatric): Secondary | ICD-10-CM

## 2014-05-15 NOTE — Sleep Study (Signed)
   NAME: [name] DATE OF BIRTH:  May 13, 1960 MEDICAL RECORD NUMBER [id number]  LOCATION: [facility]  PHYSICIAN: Nechuma Boven D  DATE OF STUDY: 05/11/2014  SLEEP STUDY TYPE: Nocturnal Polysomnogram               REFERRING PHYSICIAN: Melony Overly E, *  INDICATION FOR STUDY: Hypersomnia with sleep apnea  EPWORTH SLEEPINESS SCORE:   7/24 HEIGHT: 5\' 7"  (170.2 cm)  WEIGHT: 133 lb (60.328 kg)    Body mass index is 20.83 kg/(m^2).  NECK SIZE: 14 in.  MEDICATIONS: Charted for review  SLEEP ARCHITECTURE: Total sleep time 313.5 minutes with sleep efficiency 76.6%. Stage I was 2.2%, stage II 55.2%, stage III 7%, REM 35.6% of total sleep time. Sleep latency 41.5 minutes, REM latency 66 minutes, awake after sleep onset 54.5 minutes, arousal index 6.5, bedtime medication: None  RESPIRATORY DATA: Apnea hypopnea index (AHI) 0.6 per hour. 3 total events scored including 2 obstructive apneas and one hypopnea. Non-positional. REM AHI 1.6 per hour. There were not enough events to permit split protocol CPAP titration.  OXYGEN DATA: Moderate snoring with oxygen desaturation to a nadir of 92% and mean saturation 95.6% on room air  CARDIAC DATA: Normal sinus rhythm  MOVEMENT/PARASOMNIA: No significant movement disturbance, bathroom 1  IMPRESSION/ RECOMMENDATION:   1) Unremarkable sleep architecture with incidental note of more percentage time spent in rem than predicted  2) Occasional respiratory event with sleep disturbance, within normal limits. AHI 0.6 per hour. The normal range for adults is an AHI from 0-5 events per hour. Moderate snoring with oxygen desaturation to a nadir of 92% and mean saturation 95.6% on room air  Strong, American Board of Sleep Medicine  ELECTRONICALLY SIGNED ON:  05/15/2014, 2:50 PM [place] PH: [phone]   FX: [phone] [place]

## 2014-06-29 ENCOUNTER — Ambulatory Visit: Payer: 59 | Admitting: Internal Medicine

## 2014-07-16 ENCOUNTER — Encounter: Payer: Self-pay | Admitting: Family Medicine

## 2014-07-16 ENCOUNTER — Ambulatory Visit (INDEPENDENT_AMBULATORY_CARE_PROVIDER_SITE_OTHER): Payer: 59 | Admitting: Family Medicine

## 2014-07-16 VITALS — BP 112/78 | HR 67 | Ht 67.0 in | Wt 137.0 lb

## 2014-07-16 DIAGNOSIS — F418 Other specified anxiety disorders: Secondary | ICD-10-CM

## 2014-07-16 DIAGNOSIS — M216X9 Other acquired deformities of unspecified foot: Secondary | ICD-10-CM

## 2014-07-16 DIAGNOSIS — G609 Hereditary and idiopathic neuropathy, unspecified: Secondary | ICD-10-CM

## 2014-07-16 NOTE — Assessment & Plan Note (Signed)
She will be fitted in orthotics in the near future.

## 2014-07-16 NOTE — Patient Instructions (Signed)
Good to meet the better half :) Glucosamine 1500mg  daily Turmeric 500mg  twice daily Continue the B complex Fish oil 2 grams daily 5HTP of 50mg -100mg  daily seems to be most beneficial usually before bed.  Valerian root approx 500mg  at night Continue the B complex We will ge tyou in orthotics Be careful with the walking barefoot and start slow.  Look into more omega 3 foods and decrease omega 6 foods Alkaline the better See me again in 4 weeks.

## 2014-07-16 NOTE — Progress Notes (Signed)
Pre visit review using our clinic review tool, if applicable. No additional management support is needed unless otherwise documented below in the visit note. 

## 2014-07-16 NOTE — Progress Notes (Signed)
  [facility] [address] [place], [place] [address] Phone: [phone] Subjective:    I'm seeing this patient by the request  of:  Olga Millers, MD   CC: arthralgias, neuropathy  NOM:[id number] CLARECE DRZEWIECKI is a 54 y.o. female coming in with complaint of open [place] of the hand and feet as well as neuropathy. Patient does have a past medical history significant for breast cancer status post chemotherapy. Patient has been now cancer free for multiple years and has been doing much more natural supplementations and tries to avoid any prescription medications when possible. Patient after the chemotherapy though did have some peripheral neuropathy of the feet as well as the hands. Patient has noticed that her feet has been more painful recently. States that when she wears improper shoes she has more discomfort. Patient has noticed sometimes walking barefooted can give her more pain or sometimes he can feel better. 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 Patient denies any weakness. Denies any fevers chills or any abnormal weight loss. Patient is taking vitamin B12 and B6 supplementation to try to help with the neuropathy. None of the pain or the neuropathy is debilitating.     Past medical history, social, surgical and family history all reviewed in electronic medical record.   Review of Systems: No headache, visual changes, nausea, vomiting, diarrhea, constipation, dizziness, abdominal pain, skin rash, fevers, chills, night sweats, weight loss, swollen lymph nodes, body aches, joint swelling, muscle aches, chest pain, shortness of breath, mood changes.   Objective Blood pressure 112/78, pulse 67, weight 137 lb (62.143 kg), SpO2 96 %.  General: No apparent distress alert and oriented x3 mood and affect normal, dressed  appropriately.  HEENT: Pupils equal, extraocular movements intact  Respiratory: Patient's speak in full sentences and does not appear short of breath  Cardiovascular: No lower extremity edema, non tender, no erythema  Skin: Warm dry intact with no signs of infection or rash on extremities or on axial skeleton.  Abdomen: Soft nontender  Neuro: Cranial nerves II through XII are intact, neurovascularly intact in all extremities with 2+ DTRs and 2+ pulses. No neuropathy noted today. Lymph: No lymphadenopathy of posterior or anterior cervical chain or axillae bilaterally.  Gait normal with good balance and coordination.  MSK:  Non tender with full range of motion and good stability and symmetric strength and tone of shoulders, elbows, wrist, hip, knee and ankles bilaterally.  Foot exam shows the patient does have breakdown of the transverse arch bilaterally with bunionette and bunionette formation bilaterally right greater than left. In addition to this patient has some very mild overpronation of the hindfoot right greater than left. Otherwise fairly unremarkable. Patient does have hammering of the second toe on the right foot    Impression and Recommendations:     This case required medical decision making of moderate complexity.

## 2014-07-16 NOTE — Assessment & Plan Note (Signed)
Patient did state that she has been having some anxiety as well. Patient has been Effexor previously and unfortunately this made her more agitated. Patient was given an recent prescription for Paxil but she would like to see if there is anything she can do naturally. We discussed some over-the-counter medications and may be beneficial including valerian root and 5 HTP. Patient will try to make these changes and come back and see me again in 3-4 weeks.

## 2014-07-16 NOTE — Assessment & Plan Note (Signed)
I do believe that this is likely secondary to her chemotherapy. We discussed continuing the over-the-counter medications we discussed other ones as well as proper dosing. We discussed icing regimen that I think will be more beneficial as well. We discussed that we will put patient in orthotics secondary to her loss of her transverse arch that may also help more with alignment and help with some of the neuropathy. Patient will try to make these changes and come back and see me again in 4 weeks for further evaluation.

## 2014-07-26 ENCOUNTER — Ambulatory Visit: Payer: 59 | Admitting: Family Medicine

## 2014-07-27 ENCOUNTER — Encounter: Payer: Self-pay | Admitting: Family Medicine

## 2014-07-27 ENCOUNTER — Ambulatory Visit (INDEPENDENT_AMBULATORY_CARE_PROVIDER_SITE_OTHER): Payer: 59 | Admitting: Family Medicine

## 2014-07-27 DIAGNOSIS — M216X9 Other acquired deformities of unspecified foot: Secondary | ICD-10-CM

## 2014-07-27 NOTE — Patient Instructions (Signed)

## 2014-07-27 NOTE — Assessment & Plan Note (Signed)
Home exercises given. We discussed icing regimen. We discussed different changes in patient imaging. Patient come back and see me again in 4 weeks to make sure the patient is responding well.

## 2014-07-27 NOTE — Progress Notes (Signed)
Patient was fitted for a : standard, cushioned, semi-rigid orthotic. The orthotic was heated and afterward the patient was in a seated position and the orthotic molded. The patient was positioned in subtalar neutral position and 10 degrees of ankle dorsiflexion in a non-weight bearing stance. After completion of molding, patient did have orthotic management which included instructions on acclimating to the orthotics, signs of ill fit as well as care for the orthotic.   The blank was ground to a stable position for weight bearing. Size: 8 (igli Comfort)  Base: Carbon fiber Additional Posting and Padding: The following postings were fitted onto the molded orthotics to help maintain a talar neutral position - Wedge posting for transverse arch: 528/41       Silicone posting for longitudinal arch: 250/100 The patient ambulated these, and they were very comfortable and supportive.

## 2014-08-06 ENCOUNTER — Ambulatory Visit (INDEPENDENT_AMBULATORY_CARE_PROVIDER_SITE_OTHER): Payer: 59 | Admitting: Family Medicine

## 2014-08-06 DIAGNOSIS — M216X9 Other acquired deformities of unspecified foot: Secondary | ICD-10-CM | POA: Diagnosis not present

## 2014-08-06 NOTE — Progress Notes (Signed)
Patient was fitted for a : standard, cushioned, semi-rigid orthotic. The orthotic was heated and afterward the patient was in a seated position and the orthotic molded. The patient was positioned in subtalar neutral position and 10 degrees of ankle dorsiflexion in a non-weight bearing stance. After completion of molding, patient did have orthotic management which included instructions on acclimating to the orthotics, signs of ill fit as well as care for the orthotic.  I spent 45 minutes discussing with the patient footwear, modifications to orthotics, fit of orthotics, transitioning between shoes and proper fit.   The blank was ground to a stable position for weight bearing. Size: 8 (Business Silver)  Base: Carbon fiber Additional Posting and Padding: The following postings were fitted onto the molded orthotics to help maintain a talar neutral position - Wedge posting for transverse arch:  None     Silicone posting for longitudinal arch: 200/70 post and 200/60 wedge bilaterally  The patient ambulated these, and they were very comfortable and supportive.We were able to have these orthotics fit in multiple shoes that she brought with her.

## 2014-08-06 NOTE — Patient Instructions (Signed)

## 2014-08-09 ENCOUNTER — Encounter: Payer: Self-pay | Admitting: Family Medicine

## 2014-08-09 NOTE — Assessment & Plan Note (Signed)
She given custom orthotics today. We discussed wearing them slowly over the course of time. Patient will come back in 2-4 weeks for further evaluation and treatment.

## 2014-08-13 ENCOUNTER — Other Ambulatory Visit: Payer: Self-pay | Admitting: *Deleted

## 2014-08-13 DIAGNOSIS — Z853 Personal history of malignant neoplasm of breast: Secondary | ICD-10-CM

## 2014-08-16 ENCOUNTER — Other Ambulatory Visit (HOSPITAL_BASED_OUTPATIENT_CLINIC_OR_DEPARTMENT_OTHER): Payer: 59

## 2014-08-16 DIAGNOSIS — Z853 Personal history of malignant neoplasm of breast: Secondary | ICD-10-CM

## 2014-08-16 LAB — COMPREHENSIVE METABOLIC PANEL (CC13)
ALBUMIN: 4.1 g/dL (ref 3.5–5.0)
ALT: 18 U/L (ref 0–55)
AST: 20 U/L (ref 5–34)
Alkaline Phosphatase: 80 U/L (ref 40–150)
Anion Gap: 13 mEq/L — ABNORMAL HIGH (ref 3–11)
BUN: 12.4 mg/dL (ref 7.0–26.0)
CO2: 22 mEq/L (ref 22–29)
Calcium: 9.1 mg/dL (ref 8.4–10.4)
Chloride: 107 mEq/L (ref 98–109)
Creatinine: 0.8 mg/dL (ref 0.6–1.1)
EGFR: 80 mL/min/{1.73_m2} — ABNORMAL LOW (ref >90)
Glucose: 88 mg/dl (ref 70–140)
POTASSIUM: 4.1 meq/L (ref 3.5–5.1)
Sodium: 143 mEq/L (ref 136–145)
Total Bilirubin: 0.21 mg/dL (ref 0.20–1.20)
Total Protein: 7 g/dL (ref 6.4–8.3)

## 2014-08-16 LAB — CBC WITH DIFFERENTIAL/PLATELET
BASO%: 1.2 % (ref 0.0–2.0)
BASOS ABS: 0.1 10*3/uL (ref 0.0–0.1)
EOS ABS: 0.2 10*3/uL (ref 0.0–0.5)
EOS%: 3.2 % (ref 0.0–7.0)
HCT: 44.5 % (ref 34.8–46.6)
HGB: 14.7 g/dL (ref 11.6–15.9)
LYMPH%: 29.2 % (ref 14.0–49.7)
MCH: 29.9 pg (ref 25.1–34.0)
MCHC: 33 g/dL (ref 31.5–36.0)
MCV: 90.6 fL (ref 79.5–101.0)
MONO#: 0.5 10*3/uL (ref 0.1–0.9)
MONO%: 7.2 % (ref 0.0–14.0)
NEUT%: 59.2 % (ref 38.4–76.8)
NEUTROS ABS: 3.8 10*3/uL (ref 1.5–6.5)
Platelets: 295 10*3/uL (ref 145–400)
RBC: 4.91 10*6/uL (ref 3.70–5.45)
RDW: 13.1 % (ref 11.2–14.5)
WBC: 6.4 10*3/uL (ref 3.9–10.3)
lymph#: 1.9 10*3/uL (ref 0.9–3.3)

## 2014-08-20 ENCOUNTER — Ambulatory Visit (INDEPENDENT_AMBULATORY_CARE_PROVIDER_SITE_OTHER): Payer: 59 | Admitting: Family Medicine

## 2014-08-20 ENCOUNTER — Encounter: Payer: Self-pay | Admitting: Family Medicine

## 2014-08-20 VITALS — BP 114/82 | HR 111 | Ht 67.0 in | Wt 135.0 lb

## 2014-08-20 DIAGNOSIS — G609 Hereditary and idiopathic neuropathy, unspecified: Secondary | ICD-10-CM | POA: Diagnosis not present

## 2014-08-20 NOTE — Progress Notes (Signed)
  [facility] [address] [place], [place] [address] Phone: [phone] Subjective:    I'm seeing this patient by the request  of:  Olga Millers, MD   CC: arthralgias, neuropathy  [id number] [name] is a 54 y.o. female coming in with complaint of neuropathy of the feet. Patient was put in custom orthotics due to the breakdown of her feet. We discussed over-the-counter natural supplementations indifferent changes the patient can make naturally. Patient was given home exercises as well as an icing protocol. Patient states she is feeling significantly better. Patient states that the neuropathy in the feet is much better. Patient also states that the arthralgias she had in her joints is much better since she's been on the glucosamine. Patient states that she's been very happy with the results and also the orthotics have helped her feet significantly. No significant complaints at this time.     Past medical history, social, surgical and family history all reviewed in electronic medical record.   Review of Systems: No headache, visual changes, nausea, vomiting, diarrhea, constipation, dizziness, abdominal pain, skin rash, fevers, chills, night sweats, weight loss, swollen lymph nodes, body aches, joint swelling, muscle aches, chest pain, shortness of breath, mood changes.   Objective Blood pressure 114/82, pulse 111, height 5\' 7"  (1.702 m), weight 135 lb (61.236 kg), SpO2 96 %.  General: No apparent distress alert and oriented x3 mood and affect normal, dressed appropriately.  HEENT: Pupils equal, extraocular movements intact  Respiratory: Patient's speak in full sentences and does not appear short of breath  Cardiovascular: No lower extremity edema, non tender, no erythema  Skin: Warm dry intact with no signs of infection or rash on extremities or on axial skeleton.  Abdomen: Soft nontender  Neuro: Cranial nerves II through XII are intact,  neurovascularly intact in all extremities with 2+ DTRs and 2+ pulses. No neuropathy noted today. Lymph: No lymphadenopathy of posterior or anterior cervical chain or axillae bilaterally.  Gait normal with good balance and coordination.  MSK:  Non tender with full range of motion and good stability and symmetric strength and tone of shoulders, elbows, wrist, hip, knee and ankles bilaterally.  Foot exam shows the patient does have breakdown of the transverse arch bilaterally with bunionette and bunionette formation bilaterally right greater than left. Patient and orthotics as well as an talar neutral. Neurovascular intact distally with full range of motion of the ankle.    Impression and Recommendations:     This case required medical decision making of moderate complexity.

## 2014-08-20 NOTE — Progress Notes (Signed)
Pre visit review using our clinic review tool, if applicable. No additional management support is needed unless otherwise documented below in the visit note. 

## 2014-08-20 NOTE — Assessment & Plan Note (Signed)
Patient is doing much better now. I do think that some of this still can be seen related the patient has responded very well to the orthotics and the over-the-counter natural supplementations. We discussed continuing some home exercises. Patient can follow-up more on an as-needed basis.

## 2014-08-20 NOTE — Patient Instructions (Signed)
Good to see you You are doing great I will get back to you on the Dempro Change orthotics when you need it Watch the tea and do about 1-2 cups only daily New exercises for the hip  Can consider DHEA but no more then 2 times a week would be what I recommend.  See me when you need me.

## 2014-08-23 ENCOUNTER — Ambulatory Visit (HOSPITAL_BASED_OUTPATIENT_CLINIC_OR_DEPARTMENT_OTHER): Payer: 59 | Admitting: Oncology

## 2014-08-23 ENCOUNTER — Encounter: Payer: Self-pay | Admitting: Oncology

## 2014-08-23 ENCOUNTER — Telehealth: Payer: Self-pay | Admitting: Oncology

## 2014-08-23 ENCOUNTER — Other Ambulatory Visit: Payer: Self-pay | Admitting: *Deleted

## 2014-08-23 VITALS — BP 103/79 | HR 68 | Temp 98.1°F | Resp 18 | Ht 67.0 in | Wt 136.6 lb

## 2014-08-23 DIAGNOSIS — Z853 Personal history of malignant neoplasm of breast: Secondary | ICD-10-CM | POA: Diagnosis not present

## 2014-08-23 DIAGNOSIS — N644 Mastodynia: Secondary | ICD-10-CM

## 2014-08-23 DIAGNOSIS — Z170 Estrogen receptor positive status [ER+]: Secondary | ICD-10-CM

## 2014-08-23 DIAGNOSIS — C50511 Malignant neoplasm of lower-outer quadrant of right female breast: Secondary | ICD-10-CM | POA: Insufficient documentation

## 2014-08-23 DIAGNOSIS — C50911 Malignant neoplasm of unspecified site of right female breast: Secondary | ICD-10-CM

## 2014-08-23 MED ORDER — ALPRAZOLAM 0.25 MG PO TBDP: 0.2500 mg | ORAL_TABLET | Freq: Three times a day (TID) | ORAL | Status: DC | PRN

## 2014-08-23 MED ORDER — VENLAFAXINE HCL 37.5 MG PO TABS: 37.5000 mg | ORAL_TABLET | Freq: Two times a day (BID) | ORAL | Status: DC

## 2014-08-23 NOTE — Progress Notes (Signed)
ID: MAURY BAMBA   DOB: 03-Jun-1960  MR#: [id number]  GOT#:[id number]  PCP: Olga Millers, MD GYN: Delsa Bern SU: OTHER MD: Radene Journey, Enrique Sack, Robert Buccini  HISTORY OF PRESENT ILLNESS: From the original intake note:  [name] is followed very closely mammographically because of a strong family history of breast cancer which is detailed below.  She had a right breast ultrasound in January 2008 for follow up of a right breast fibroadenoma (this found no significant change as compared to January 2007 or July 2007).    On 11-08-06 she had bilateral diagnostic mammograms and ultrasounds.  She does have dense parenchyma on the right.  There were no findings suspicious for malignancy and the fibroadenoma which was being followed was again found to be unchanged.  In the left breast there were two smoothly  marginated masses which were new compared to prior and they were both found to be cysts by ultrasound.  Because of the patient's high baseline cancer risk, bilateral MRI's were again obtained on 11-25-06 (the prior MRI I believe had been two years before).  Again, the likely fibroadenoma in the lateral aspect of the right breast was noted.  More importantly, a 1.1 cm. enhancing nodule in the inferior aspect of the right breast appeared slightly larger than previously with the suggestion of very minimal margin irregularity.  There were no abnormal appearing lymph nodes and incidentally no liver abnormality was noted.  Because of this change, the focused right breast ultrasound was obtained on  11-28-06.  This showed a 1.4 cm. hypoechoic area where the MRI abnormality was found however, a little bit more laterally than expected, and for that reason MRI biopsy was preferred.  This was performed on 01-20-07 and showed ([id number]) a high grade ductal carcinoma in situ which was 99% ER and 98% PR positive.   Her subsequent history is as detailed below  INTERVAL HISTORY: [name]  returns today for followup of her breast cancer. She is not on any antiestrogen side continues on observation, once a year. Her last mammogram was June 2015 and unremarkable  REVIEW OF SYSTEMS: She has been having discomfort in the left lateral breast. She tells me she has a sister there. Anything that rubs sitter presses against it makes it very unpleasant. She has tried a variety of compresses massage etc. with very little success. Her neuropathy is better thinks to some orthotics and continuing vitamin B-12 supplementation. Glucosamine also was helpful. She had stopped taking Effexor because she had some hair loss and some "zaps" or electrical-like discharges across the brain that she thought were related to the drug. Things got better off the drug but then she got very irritable and was started on Lexapro. After a while the symptoms recurred so she stopped that. Her new primary care physician Dr. Doug Sou suggested Paxil. She has not started that however. She is having hot flashes which did get better with the Effexor in the past. Also she has developed some diarrhea, which is a new symptom. She is seeing a naturopath for this. She is going to have a gallbladder Braulio Conte. If that doesn't work she will be calling Dr. Miachel Roux.  PAST MEDICAL HISTORY: Past Medical History  Diagnosis Date  . Cancer 2008    Breast  . Congenital anomaly of aortic arch   . Unspecified hereditary and idiopathic peripheral neuropathy   . Labyrinthitis, unspecified   . Unspecified hearing loss   . Unspecified tinnitus   . Malignant  neoplasm of breast (female), unspecified site   . Other B-complex deficiencies   . Other malaise and fatigue   . Adhesive capsulitis of shoulder   1. Removal of a left breast fibroadenoma in 1980. 2. History of endometriosis diagnosed by laparoscopy I believe in 2003. 3. History of right endometrial artery embolization for fibroid control by Interventional Radiology in April  2008. 4. History of basal cell carcinoma removed from the area beneath the left orbit, status post Mohs surgery.  5. History of adhesive capsulitis of both shoulders. 6. History of an ascending aortic aneurysm measuring 3.7 cm.  PAST SURGICAL HISTORY: Past Surgical History  Procedure Laterality Date  . Breast lumpectomy  Oct.29 & Mar 19, 2007    Right  . Breast fibroadenoma surgery  1981    Left  . Wisdom teeth extracted    . Laparoscopy  1982    for endometriosis  . Abdominal hysterectomy  04/2010    robotic  . Basal cell carcinoma excision  2003    rt eye area  s/p TAH-BSO 05/11/2010  FAMILY HISTORY Family History  Problem Relation Age of Onset  . Seizures Mother   . Migraines Mother   . Hypertension Father   . Other Father   . Hyperlipidemia Father   . Cancer Sister     breast  . Cancer Other     breast  The patient has one full sister who was diagnosed with breast cancer at the age of 79.  She is now 57 and doing very well.  She has a half brother who is 29.  The patient's mother committed suicide at the age of 80.  She did have fibrocystic change but, of course, we would not know if she had lived to develop breast cancer.  The patient's mother's mother however, died from breast cancer at the age of 71.  It had been diagnosed when she was 62.  That maternal grandmother had an additional six sisters, one of whom also had breast cancer, the patient does not know at what age.  On the patient's father's side, the father had two sisters, one of them developed breast cancer in her 56s and apparently died from metastatic breast cancer. There is a history of stomach cancer on her father's side and of bladder cancer on her mother's side, but this is now two degrees removed.  There is no history of ovarian cancer anywhere in the family to the patient's knowledge.  GYNECOLOGIC HISTORY: She is GX P0. She is s/p TAH BSO as of January 2012, with benign pathology  SOCIAL HISTORY: She is  a Education administrator.  She is married to [name] who works as a Engineering geologist.  They have six cats.  She is not a church attender but describes herself as a very spiritual person.     ADVANCED DIRECTIVES: her husband is HCPOA  HEALTH MAINTENANCE: History  Substance Use Topics  . Smoking status: Never Smoker   . Smokeless tobacco: Never Used  . Alcohol Use: No     Comment: wine with drinner a few nights a week     Colonoscopy:  PAP:  Bone density:  Lipid panel:  Allergies  Allergen Reactions  . Polysorbate Anaphylaxis    "Polysorbate 80 preservative"  . Iohexol      Desc: Pt states that she developed itching after receiving Magnevist-MRI contrast.   . Methylprednisolone     Current Outpatient Prescriptions  Medication Sig Dispense Refill  . ALPRAZolam (NIRAVAM)  0.25 MG dissolvable tablet Take 1 tablet (0.25 mg total) by mouth 3 (three) times daily as needed for anxiety. 60 tablet 0  . Ascorbic Acid (VITAMIN C) 1000 MG tablet Take 1,000 mg by mouth daily.    . Calcium-Magnesium-Vitamin D (CALCIUM MAGNESIUM PO) Take by mouth daily. 4 tablets daily     . Cholecalciferol (VITAMIN D) 1000 UNITS capsule Take 1,000 Units by mouth daily.      . fish oil-omega-3 fatty acids 1000 MG capsule Take 2 g by mouth daily.      . Folic Acid-Vit P8-KDX I33 0.5-5-0.2 MG TABS Take 1 tablet by mouth daily.    . Glucosamine-Chondroit-Vit C-Mn (GLUCOSAMINE 1500 COMPLEX PO) Take by mouth.    . Indole-3-Carbinol POWD 400 mg by Does not apply route daily.    Marland Kitchen Lysine 500 MG CAPS Take 1 capsule by mouth daily.    . Multiple Vitamins-Minerals (ANTI-OXIDANT FORMULA PO) Take 1 tablet by mouth daily.    Marland Kitchen OVER THE COUNTER MEDICATION 5htp 50-170m daily.    . Probiotic Product (PROBIOTIC DAILY PO) Take by mouth daily.    . Psyllium (METAMUCIL PO) Take by mouth.      . TURMERIC PO Take 500 mg by mouth.     . venlafaxine (EFFEXOR) 37.5 MG tablet Take 1 tablet (37.5 mg total) by mouth 2 (two) times  daily with a meal. 60 tablet 3   No current facility-administered medications for this visit.    OBJECTIVE: Middle-aged white woman who appears well Filed Vitals:   08/23/14 1026  BP: 103/79  Pulse: 68  Temp: 98.1 F (36.7 C)  Resp: 18     Body mass index is 21.39 kg/(m^2).    ECOG FS: 0 Filed Weights   08/23/14 1026  Weight: 136 lb 9.6 oz (61.961 kg)   Sclerae unicteric, EOMs intact Oropharynx clear and moist No cervical or supraclavicular adenopathy Lungs no rales or rhonchi Heart regular rate and rhythm Abd soft, nontender, with positive bowel sounds, no masses palpated MSK no focal spinal tenderness, no upper extremity lymphedema Neuro: nonfocal, well oriented, anxious affect Breasts: The right breast is status post lumpectomy and sentinel lymph node sampling. There is no evidence of local recurrence. The right axilla is benign. I do not palpate any abnormality in the lateral left breast, which is the area where she is symptomatic. Specifically there is no mass, no erythema, and no fluctuance.  LAB RESULTS: Lab Results  Component Value Date   WBC 6.4 08/16/2014   NEUTROABS 3.8 08/16/2014   HGB 14.7 08/16/2014   HCT 44.5 08/16/2014   MCV 90.6 08/16/2014   PLT 295 08/16/2014      Chemistry      Component Value Date/Time   NA 143 08/16/2014 0811   NA 141 03/30/2014 1018   K 4.1 08/16/2014 0811   K 3.9 03/30/2014 1018   CL 108 03/30/2014 1018   CL 105 08/20/2012 0809   CO2 22 08/16/2014 0811   CO2 24 03/30/2014 1018   BUN 12.4 08/16/2014 0811   BUN 17 03/30/2014 1018   CREATININE 0.8 08/16/2014 0811   CREATININE 0.8 03/30/2014 1018      Component Value Date/Time   CALCIUM 9.1 08/16/2014 0811   CALCIUM 8.8 03/30/2014 1018   ALKPHOS 80 08/16/2014 0811   ALKPHOS 61 08/14/2011 1016   AST 20 08/16/2014 0811   AST 21 08/14/2011 1016   ALT 18 08/16/2014 0811   ALT 13 08/14/2011 1016   BILITOT 0.21  08/16/2014 0811   BILITOT 0.3 08/14/2011 1016       Lab  Results  Component Value Date   LABCA2 38 11/06/2010     STUDIES: Repeat routine mammography due June of this year  ASSESSMENT: 54 y.o. [place] woman   (1)  status post right lumpectomy and sentinel lymph node biopsy October of 2008 for a 7 mm grade 1 invasive ductal carcinoma, strongly estrogen and progesterone receptor positive, HER2 negative, with an Oncotype recurrence score of 20, predicting a recurrence of 13% after 5 years on tamoxifen,   (2)  status post Abraxane given weekly x8 completed February 2009, followed by radiation completed April 2009, at which time she started tamoxifen.    (3)  She has also tried aromatase inhibitors, namely letrozole and exemestane, with no success.  She resumed tamoxifen October 2012, with poor tolerance; held it for six months, then tried again October 2013, finally went off it permanently April 2014  PLAN:  [name] is now nearly 8 years out from her definitive surgery with no evidence of disease recurrence. This is very favorable.  I think the symptoms she has of worsening anxiety, hair loss, perhaps a little bit of weight gain, and the "brain zaps" that she sometimes experiences are going to be cyclical. She attributes them to medication, stops the medication, and they get better, but likely the symptoms were going to get better anyway.  I think she may as well go back to the Effexor she tried initially. It worked well for her. She would like to go with the generic, which is twice a day and she is likely going to take that just daily, which is the very lowest dose we can do there. She also requested a refill on on her Xanax, which she also takes that very minimal doses.  I suggested she contact Dr. Marco Collie regarding her diarrhea, but she is going to do the gallbladder Braulio Conte first and if that doesn't work then she will call his office.  I'm asking Dr. Joneen Caraway at the [facility] to evaluate the left breast to see if there is indeed a cyst in the  area that she is pointing to. Possibly that could be drained and that might be helpful to her.  Otherwise she will see me again in one year. She knows to call for any problems that may develop before the next visit here.      MAGRINAT,GUSTAV C    08/23/2014

## 2014-08-23 NOTE — Telephone Encounter (Signed)
per pof tos h pt appt-per pt req to mail copy of sch-mailed

## 2014-08-25 ENCOUNTER — Other Ambulatory Visit: Payer: Self-pay | Admitting: Oncology

## 2014-08-25 DIAGNOSIS — C50912 Malignant neoplasm of unspecified site of left female breast: Secondary | ICD-10-CM

## 2014-10-19 ENCOUNTER — Other Ambulatory Visit: Payer: Self-pay | Admitting: Oncology

## 2014-10-19 DIAGNOSIS — R2232 Localized swelling, mass and lump, left upper limb: Secondary | ICD-10-CM

## 2014-10-19 DIAGNOSIS — Z853 Personal history of malignant neoplasm of breast: Secondary | ICD-10-CM

## 2014-10-21 ENCOUNTER — Ambulatory Visit (INDEPENDENT_AMBULATORY_CARE_PROVIDER_SITE_OTHER)
Admission: RE | Admit: 2014-10-21 | Discharge: 2014-10-21 | Disposition: A | Discharge status: Home / Self Care | Payer: 59 | Source: Ambulatory Visit | Attending: Internal Medicine | Admitting: Internal Medicine

## 2014-10-21 ENCOUNTER — Ambulatory Visit (INDEPENDENT_AMBULATORY_CARE_PROVIDER_SITE_OTHER): Payer: 59 | Admitting: Internal Medicine

## 2014-10-21 VITALS — BP 126/84 | HR 77 | Temp 98.3°F | Resp 16 | Wt 134.0 lb

## 2014-10-21 DIAGNOSIS — Z87898 Personal history of other specified conditions: Secondary | ICD-10-CM | POA: Diagnosis not present

## 2014-10-21 DIAGNOSIS — R197 Diarrhea, unspecified: Secondary | ICD-10-CM | POA: Diagnosis not present

## 2014-10-21 DIAGNOSIS — Q254 Congenital malformation of aorta unspecified: Secondary | ICD-10-CM

## 2014-10-21 NOTE — Progress Notes (Signed)
Pre visit review using our clinic review tool, if applicable. No additional management support is needed unless otherwise documented below in the visit note. 

## 2014-10-21 NOTE — Patient Instructions (Signed)
  Your next office appointment will be determined based upon review of your pending labs  and  xrays  Those written interpretation of the lab results and instructions will be transmitted to you by My Chart   Critical results will be called.   Followup as needed for any active or acute issue. Please report any significant change in your symptoms. 

## 2014-10-21 NOTE — Progress Notes (Signed)
   Subjective:    Patient ID: [name], female    DOB: 1960-07-02, 54 y.o.   MRN: [id number]  HPI She describes orthopnea which has exacerbated in the past month. 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 She describes it as a feeling of fullness and shortness of breath. It is not exacerbated by exercise.  She apparently had been evaluated in 2015  and told that this was related to anxiety. Effexor was prescribed initially; but it was changed to Lexapro. This was stopped because of adverse effects.  She is concerned because she has a history of aneurysmal dilation of the ascending aorta measuring 38 x 38 mm in 2010. This appeared essentially stable compared to 2006. This was an incidental finding during her evaluation of breast cancer. She  took tamoxifen for 5 years.  She has had frankly diarrheal stools since January. The only possible trigger was ingestion of sushi. There were no travel, antibiotic, well water or other potential causal exposures. She has an appointment to be seen by Dr Buccini's group next week. She's had no stool studies to date. She has used a natural parasite cleansing preparation without benefit.  She does have some reflux which exacerbated the last 2 days. She's had generalized tenderness across abdomen up to level IV-V which can last several hours.  She drinks 60-70 ounces of water a day and 2 cups of coffee daily. She exercises 4 miles a day w/o cardiopulmonary symptoms. Her diet is comprised of 80% vegetables.   Review of Systems She denies dysphagia, melana, or rectal bleeding. She is not having myalgias. Chest pain, palpitations, tachycardia, exertional dyspnea, paroxysmal nocturnal dyspnea, claudication or edema are absent.      Objective:   Physical Exam  Pertinent or positive findings include: She is thin but adequately nourished. Nasal mucosal erythema  present.Full pulmonary exam including tactile fremitus and whispered pectoriloquy was normal.  General appearance :adequately nourished; in no distress.  Eyes: No conjunctival inflammation or scleral icterus is present.  Oral exam:  Lips and gums are healthy appearing.There is no oropharyngeal erythema or exudate noted. Dental hygiene is good.  Heart:  Normal rate and regular rhythm. S1 and S2 normal without gallop, murmur, click, rub or other extra sounds    Lungs:Chest clear to auscultation; no wheezes, rhonchi,rales ,or rubs present.No increased work of breathing.   Abdomen: bowel sounds normal, soft and non-tender without masses, organomegaly or hernias noted.  No guarding or rebound.   Vascular : all pulses equal ; no bruits present.  Skin:Warm & dry.  Intact without suspicious lesions or rashes ; no tenting or jaundice   Lymphatic: No lymphadenopathy is noted about the head, neck, axilla   Neuro: Strength, tone & DTRs normal.       Assessment & Plan:  #1 orthopnea  #2 diarrhea  #3 history of aneurysmal dilation of the ascending aorta.  Plan: See orders and recommendations

## 2014-10-22 ENCOUNTER — Other Ambulatory Visit: Payer: 59

## 2014-10-22 ENCOUNTER — Encounter: Payer: Self-pay | Admitting: Internal Medicine

## 2014-10-22 ENCOUNTER — Other Ambulatory Visit: Payer: Self-pay | Admitting: Internal Medicine

## 2014-10-22 DIAGNOSIS — R197 Diarrhea, unspecified: Secondary | ICD-10-CM

## 2014-10-23 LAB — C. DIFFICILE GDH AND TOXIN A/B
C. difficile GDH: NOT DETECTED
C. difficile Toxin A/B: NOT DETECTED

## 2014-10-25 ENCOUNTER — Ambulatory Visit
Admission: RE | Admit: 2014-10-25 | Discharge: 2014-10-25 | Disposition: A | Discharge status: Home / Self Care | Payer: 59 | Source: Ambulatory Visit | Attending: Oncology | Admitting: Oncology

## 2014-10-25 ENCOUNTER — Other Ambulatory Visit: Payer: Self-pay | Admitting: Oncology

## 2014-10-25 DIAGNOSIS — R2232 Localized swelling, mass and lump, left upper limb: Secondary | ICD-10-CM

## 2014-10-25 DIAGNOSIS — Z853 Personal history of malignant neoplasm of breast: Secondary | ICD-10-CM

## 2014-10-25 DIAGNOSIS — N644 Mastodynia: Secondary | ICD-10-CM

## 2014-10-25 LAB — GIARDIA/CRYPTOSPORIDIUM (EIA)
Cryptosporidium Screen (EIA): NEGATIVE
GIARDIA SCREEN (EIA): NEGATIVE

## 2014-10-26 LAB — STOOL CULTURE

## 2014-10-29 ENCOUNTER — Other Ambulatory Visit: Payer: Self-pay | Admitting: Physician Assistant

## 2014-10-29 DIAGNOSIS — R1011 Right upper quadrant pain: Secondary | ICD-10-CM

## 2014-11-02 ENCOUNTER — Ambulatory Visit
Admission: RE | Admit: 2014-11-02 | Discharge: 2014-11-02 | Disposition: A | Discharge status: Home / Self Care | Payer: 59 | Source: Ambulatory Visit | Attending: Physician Assistant | Admitting: Physician Assistant

## 2014-11-02 DIAGNOSIS — R1011 Right upper quadrant pain: Secondary | ICD-10-CM

## 2014-11-05 ENCOUNTER — Encounter: Payer: Self-pay | Admitting: Internal Medicine

## 2014-11-12 ENCOUNTER — Telehealth: Payer: Self-pay | Admitting: Internal Medicine

## 2014-11-12 NOTE — Telephone Encounter (Signed)
Please verify prednisone & benadryl pre CT contrast with Radiology and call same to hospital OP pharmacy

## 2014-11-12 NOTE — Telephone Encounter (Signed)
Patient can not confirm that she is not allergic to dye for CT.  Patient thinks she might have taken meds before last CT.  Is requesting benadryl and prednisone to be sent to patient pharmacy for a 13 hour premed.

## 2014-11-15 ENCOUNTER — Other Ambulatory Visit: Payer: 59

## 2014-11-15 ENCOUNTER — Encounter: Payer: Self-pay | Admitting: Genetic Counselor

## 2014-11-17 ENCOUNTER — Other Ambulatory Visit: Payer: Self-pay

## 2014-11-17 MED ORDER — PREDNISONE 50 MG PO TABS
ORAL_TABLET | ORAL | Status: AC
Start: 2014-11-17 — End: 2014-11-22

## 2014-11-17 MED ORDER — DIPHENHYDRAMINE HCL 50 MG PO TABS: 50.0000 mg | ORAL_TABLET | Freq: Once | ORAL | Status: DC

## 2014-11-17 NOTE — Telephone Encounter (Signed)
Call the CT department where she is to have the CAT scan done. They need to tell us what their protocol events. I do not know what it is;this is a Radiology matter.

## 2014-11-17 NOTE — Telephone Encounter (Signed)
Both prednisone and benadryl have been sent to outpatient pharm per patient request, left message advising patient that rx has been sent---

## 2014-11-17 NOTE — Telephone Encounter (Signed)
Hopp, i could not find allergy indicated in previous ct/radiology notes or meds given for that reason around the time the last ct was done----can you advise on dose,strength and sig for both meds, i will call them in to pharm, thanks

## 2014-11-22 ENCOUNTER — Ambulatory Visit (INDEPENDENT_AMBULATORY_CARE_PROVIDER_SITE_OTHER)
Admission: RE | Admit: 2014-11-22 | Discharge: 2014-11-22 | Disposition: A | Discharge status: Home / Self Care | Payer: 59 | Source: Ambulatory Visit | Attending: Internal Medicine | Admitting: Internal Medicine

## 2014-11-22 DIAGNOSIS — Q254 Congenital malformation of aorta unspecified: Secondary | ICD-10-CM

## 2014-11-22 MED ORDER — IOHEXOL 350 MG/ML SOLN
100.0000 mL | Freq: Once | INTRAVENOUS | Status: AC | PRN
  Administered 2014-11-22: 100 mL via INTRAVENOUS

## 2014-11-23 ENCOUNTER — Encounter: Payer: Self-pay | Admitting: Internal Medicine

## 2014-11-24 ENCOUNTER — Other Ambulatory Visit: Payer: Self-pay | Admitting: Internal Medicine

## 2014-11-24 DIAGNOSIS — Q254 Congenital malformation of aorta unspecified: Secondary | ICD-10-CM

## 2014-11-24 NOTE — Telephone Encounter (Signed)
Please advise 

## 2014-11-25 ENCOUNTER — Other Ambulatory Visit: Payer: Self-pay | Admitting: Gastroenterology

## 2014-11-26 ENCOUNTER — Other Ambulatory Visit: Payer: Self-pay

## 2014-11-26 ENCOUNTER — Other Ambulatory Visit: Payer: Self-pay | Admitting: Gastroenterology

## 2014-11-26 ENCOUNTER — Encounter: Payer: Self-pay | Admitting: Internal Medicine

## 2014-11-26 ENCOUNTER — Ambulatory Visit
Admission: RE | Admit: 2014-11-26 | Discharge: 2014-11-26 | Disposition: A | Discharge status: Home / Self Care | Payer: 59 | Source: Ambulatory Visit | Attending: Gastroenterology | Admitting: Gastroenterology

## 2014-11-26 DIAGNOSIS — R52 Pain, unspecified: Secondary | ICD-10-CM

## 2014-11-26 MED ORDER — CEPHALEXIN 500 MG PO CAPS: 500.0000 mg | ORAL_CAPSULE | Freq: Three times a day (TID) | ORAL | Status: DC

## 2014-11-26 NOTE — Progress Notes (Signed)
Asked to see pt because of eryhtema and tenderness following IV during CT a few days ago  She had fever last pm 101.5 and WBC this am 16K  No streaking and no lymphadenopathy  Will Rx keflex 500 q8 x 7d

## 2014-11-29 ENCOUNTER — Ambulatory Visit (INDEPENDENT_AMBULATORY_CARE_PROVIDER_SITE_OTHER): Payer: 59 | Admitting: Internal Medicine

## 2014-11-29 DIAGNOSIS — Z87898 Personal history of other specified conditions: Secondary | ICD-10-CM | POA: Diagnosis not present

## 2014-11-29 LAB — PULMONARY FUNCTION TEST
DL/VA % pred: 74 %
DL/VA: 3.79 ml/min/mmHg/L
DLCO UNC: 20.42 ml/min/mmHg
DLCO unc % pred: 73 %
FEF 25-75 POST: 2.76 L/s
FEF 25-75 Pre: 2.53 L/sec
FEF2575-%Change-Post: 8 %
FEF2575-%Pred-Post: 103 %
FEF2575-%Pred-Pre: 94 %
FEV1-%Change-Post: 2 %
FEV1-%Pred-Post: 102 %
FEV1-%Pred-Pre: 99 %
FEV1-POST: 2.97 L
FEV1-Pre: 2.9 L
FEV1FVC-%Change-Post: 4 %
FEV1FVC-%Pred-Pre: 98 %
FEV6-%Change-Post: -1 %
FEV6-%Pred-Post: 101 %
FEV6-%Pred-Pre: 103 %
FEV6-POST: 3.67 L
FEV6-PRE: 3.72 L
FEV6FVC-%CHANGE-POST: 0 %
FEV6FVC-%PRED-POST: 103 %
FEV6FVC-%PRED-PRE: 102 %
FVC-%CHANGE-POST: -1 %
FVC-%PRED-POST: 98 %
FVC-%Pred-Pre: 100 %
FVC-Post: 3.67 L
FVC-Pre: 3.73 L
POST FEV1/FVC RATIO: 81 %
POST FEV6/FVC RATIO: 100 %
Pre FEV1/FVC ratio: 78 %
Pre FEV6/FVC Ratio: 100 %
RV % pred: 81 %
RV: 1.66 L
TLC % pred: 99 %
TLC: 5.39 L

## 2014-11-29 NOTE — Progress Notes (Signed)
PFT done today. 

## 2014-11-30 ENCOUNTER — Encounter: Payer: 59 | Admitting: Surgery

## 2014-12-01 ENCOUNTER — Encounter: Payer: Self-pay | Admitting: Internal Medicine

## 2014-12-01 NOTE — Telephone Encounter (Signed)
[name]- former allergy patient. Please find time to re-establish per request Dr Linna Darner.

## 2014-12-01 NOTE — Telephone Encounter (Signed)
Please advise 

## 2014-12-02 NOTE — Telephone Encounter (Signed)
Please have patient come in for allergy consult on 12-14-14 at 11:00am for 11:15am ALC. Thanks.

## 2014-12-06 ENCOUNTER — Institutional Professional Consult (permissible substitution) (INDEPENDENT_AMBULATORY_CARE_PROVIDER_SITE_OTHER): Payer: 59 | Admitting: Surgery

## 2014-12-06 ENCOUNTER — Encounter: Payer: Self-pay | Admitting: Surgery

## 2014-12-06 VITALS — BP 133/78 | HR 73 | Resp 20 | Ht 67.0 in | Wt 130.0 lb

## 2014-12-06 DIAGNOSIS — I7121 Aneurysm of the ascending aorta, without rupture: Secondary | ICD-10-CM

## 2014-12-06 DIAGNOSIS — I712 Thoracic aortic aneurysm, without rupture: Secondary | ICD-10-CM | POA: Diagnosis not present

## 2014-12-06 NOTE — Progress Notes (Signed)
Cardiothoracic Surgery Consultation  PCP is Unice Cobble, MD Referring Provider is Hendricks Limes, MD  Chief Complaint  Patient presents with  . Thoracic Aortic Aneurysm    Surgical eval, CTA Chest 11/22/14    HPI:  The patient is a 54 year old woman with a history of breast cancer s/p lumpectomy and XRT in 2008 as well as known enlargement of the ascending aorta that was discovered on breast MRI in 10/2004. It was read as measuring 4 cm at that time. She underwent CTA of the chest in 11/2004 which showed the ascending aorta to measure 3.7 cm. CTA in 02/2009 measured 3.8 cm. Her current CTA from 11/22/2014 showed the ascending aorta to be 39 mm. She was reportedly referred to [facility] in the past for a medical genetics evaluation to rule out hereditary aneurysm disease and was ruled out for Marfans and Ehlers-Danlos. She reportedly had a second degree relative with Ehlers-Danlos. Her only real complaint at this time is an intermittent feeling like she can't catch her breath that resolves with position change. She recently had PFT's done which I have reviewed and are normal.  Past Medical History  Diagnosis Date  . Cancer 2008    Breast  . Congenital anomaly of aortic arch   . Unspecified hereditary and idiopathic peripheral neuropathy   . Labyrinthitis, unspecified   . Unspecified hearing loss   . Unspecified tinnitus   . Malignant neoplasm of breast (female), unspecified site   . Other B-complex deficiencies   . Other malaise and fatigue   . Adhesive capsulitis of shoulder     Past Surgical History  Procedure Laterality Date  . Breast lumpectomy  Oct.29 & Mar 19, 2007    Right  . Breast fibroadenoma surgery  1981    Left  . Wisdom teeth extracted    . Laparoscopy  1982    for endometriosis  . Abdominal hysterectomy  04/2010    robotic  . Basal cell carcinoma excision  2003    rt eye area    Family History  Problem Relation Age of Onset  . Seizures Mother   . Migraines  Mother   . Hypertension Father   . Other Father   . Hyperlipidemia Father   . Cancer Sister     breast  . Cancer Other     breast    Social History History  Substance Use Topics  . Smoking status: Never Smoker   . Smokeless tobacco: Never Used  . Alcohol Use: No     Comment: wine with drinner a few nights a week    Current Outpatient Prescriptions  Medication Sig Dispense Refill  . ALPRAZolam (NIRAVAM) 0.25 MG dissolvable tablet Take 1 tablet (0.25 mg total) by mouth 3 (three) times daily as needed for anxiety. (Patient taking differently: Take 0.25 mg by mouth as needed for anxiety. ) 60 tablet 0  . Ascorbic Acid (VITAMIN C) 1000 MG tablet Take 1,000 mg by mouth daily.    . Calcium-Magnesium-Vitamin D (CALCIUM MAGNESIUM PO) Take by mouth daily. 4 tablets daily     . Cholecalciferol (VITAMIN D) 2000 UNITS CAPS Take by mouth daily.    . fish oil-omega-3 fatty acids 1000 MG capsule Take 2 g by mouth daily.      . Indole-3-Carbinol POWD 400 mg by Does not apply route daily.    . Probiotic Product (PROBIOTIC DAILY PO) Take by mouth daily.     No current facility-administered medications for this visit.  Allergies  Allergen Reactions  . Polysorbate Anaphylaxis    "Polysorbate 80 preservative"  . Iohexol      Desc: Pt states that she developed itching after receiving Magnevist-MRI contrast.   . Methylprednisolone     Review of Systems  Constitutional: Negative for fever, chills, activity change and fatigue.  HENT: Negative.   Eyes: Negative.   Respiratory: Positive for shortness of breath.   Cardiovascular: Negative for chest pain and leg swelling.  Gastrointestinal: Negative.     BP 133/78 mmHg  Pulse 73  Resp 20  Ht 5\' 7"  (1.702 m)  Wt 130 lb (58.968 kg)  BMI 20.36 kg/m2  SpO2 98% Physical Exam  Constitutional: She appears well-developed and well-nourished. No distress.  Neck: No JVD present.  No cervical bruits  Cardiovascular: Normal rate, regular rhythm  and normal heart sounds.   No murmur heard. Pulmonary/Chest: Effort normal and breath sounds normal.  Lymphadenopathy:    She has no cervical adenopathy.     Diagnostic Tests:  CLINICAL DATA: Dilatation of the ascending thoracic aorta.  EXAM: CT ANGIOGRAPHY CHEST WITH CONTRAST  TECHNIQUE: Multidetector CT imaging of the chest was performed using the standard protocol during bolus administration of intravenous contrast. Multiplanar CT image reconstructions and MIPs were obtained to evaluate the vascular anatomy.  CONTRAST: 176mL OMNIPAQUE IOHEXOL 350 MG/ML SOLN  COMPARISON: Chest x-ray dated 10/21/2014 and CT scans dated 03/19/2009 and 12/01/2004 and MRI of the chest dated 05/17/2006  FINDINGS: Again noted is dilatation of the ascending thoracic aorta. The maximum diameter on the current exam is 3.9 cm, minimally changed from 3.8 cm on the prior CT scan of 03/19/2009 and 3.7 cm on the CT scan of 12/01/2004.  Again noted is a prominent ductus bump on the underside of the aortic arch, unchanged. The descending thoracic aorta is normal. No visible atherosclerotic disease. No coronary artery calcifications. Heart size is normal.  The lungs are clear. No adenopathy. No osseous abnormality. Visualized portion of the upper abdomen is normal. Chronic slight retraction of the right nipple, unchanged since 2010.  Review of the MIP images confirms the above findings.  IMPRESSION: Minimal enlargement of the ascending thoracic aorta to a maximal diameter of 3.9 cm, increased from 3.8 cm in 2010 and 3.7 cm in 2006.  Otherwise, normal exam.   Electronically Signed  By: Lorriane Shire M.D.  On: 11/22/2014 09:28   Impression:  She has a mildly enlarged ascending aorta that has fusiform dilation to 3.9 cm at the level of the RPA. This has changed 2 mm at the most since 2006 and this could be within the error of measurement. Her aortic valve looks tricuspid on  the CT and she has no murmur to suggest an aortic valve abnormality. Her BP is mildly elevated today but she is anxious about this and she and her husband, who is a PA here, check her BP and it usually is less than 120. I have recommended continued follow up of her aorta since she is only 54 years old but I think the risk of an acute problem like aortic dissection is very low. I reviewed the CTA with her and her husband and answered all of their questions.   Plan:  I will plan to see her back in 1 year with an MRA of the chest. If this is unchanged I would consider waiting a couple years to follow it up.   Gaye Pollack, MD [facility] [phone]

## 2014-12-08 ENCOUNTER — Encounter: Payer: Self-pay | Admitting: Genetic Counselor

## 2014-12-08 DIAGNOSIS — Z1379 Encounter for other screening for genetic and chromosomal anomalies: Secondary | ICD-10-CM | POA: Insufficient documentation

## 2014-12-14 ENCOUNTER — Encounter: Payer: Self-pay | Admitting: Internal Medicine

## 2014-12-14 ENCOUNTER — Ambulatory Visit (INDEPENDENT_AMBULATORY_CARE_PROVIDER_SITE_OTHER): Payer: 59 | Admitting: Internal Medicine

## 2014-12-14 VITALS — BP 108/78 | HR 68 | Ht 66.0 in | Wt 132.0 lb

## 2014-12-14 DIAGNOSIS — Q254 Congenital malformation of aorta unspecified: Secondary | ICD-10-CM

## 2014-12-14 DIAGNOSIS — J3089 Other allergic rhinitis: Secondary | ICD-10-CM

## 2014-12-14 DIAGNOSIS — J309 Allergic rhinitis, unspecified: Secondary | ICD-10-CM | POA: Diagnosis not present

## 2014-12-14 DIAGNOSIS — R0600 Dyspnea, unspecified: Secondary | ICD-10-CM

## 2014-12-14 DIAGNOSIS — C50911 Malignant neoplasm of unspecified site of right female breast: Secondary | ICD-10-CM | POA: Diagnosis not present

## 2014-12-14 DIAGNOSIS — J302 Other seasonal allergic rhinitis: Secondary | ICD-10-CM

## 2014-12-14 MED ORDER — MONTELUKAST SODIUM 10 MG PO TABS: 10.0000 mg | ORAL_TABLET | Freq: Every day | ORAL | Status: DC

## 2014-12-14 NOTE — Assessment & Plan Note (Signed)
She is self-conscious of mild occasional chest tightness and need to take a deep breath without palpitations, evidence of anemia for parenchymal lung disease. I don't think her slight aortic dilatation as described on CT is hemodynamically significant. Most of her symptoms seem normal and comfortably resolved by ordinary amounts of stretching yawning and changing position. I invited her to let us know if something changes. In the small possibility that she has very subclinical reactive airways, we are going to let her try Singulair which made her feel better years ago.

## 2014-12-14 NOTE — Assessment & Plan Note (Signed)
History of chemotherapy and radiation therapy 2008/2009. I do not see associated right midlung zone fibrotic changes from this that would go along with her symptoms.

## 2014-12-14 NOTE — Assessment & Plan Note (Signed)
Symptoms would be consistent with mild allergic rhinitis and conjunctivitis Plan-sample Dymista nasal spray for trial, Singulair

## 2014-12-14 NOTE — Assessment & Plan Note (Signed)
She is followed for her aorta by Thoracic Surgery

## 2014-12-14 NOTE — Patient Instructions (Addendum)
Sample Dymista nasal spray    1-2 puffs each nostril once daily at bedtime  Sent script for Singulair tabs.  Ok to try one daily

## 2014-12-14 NOTE — Progress Notes (Signed)
Subjective:    Patient ID: [name], female    DOB: [birth year], 54 y.o.   MRN: [id number]  HPI 12/14/14- 5 yo never smoker F former patient  Follows For: referred by Dr. Linna Darner for mild SOB.  Pt c/o of SOB, PND, and states she feels like she does not get a full breath. Itchy eyes and sneezing. Denies any wheezing, chest congestion, sinus pressure. Benadryl helps itching. Medical hx R breast CA-chemotherapy/XRT2008 She was concerned about reference to airway obstruction and emphysema on recent PFT and chest x-ray, which I reviewed with her today. She describes need to stretch and yawn as an adolescent and again in the last 6 months. Notices this especially in yoga if positions "down dog" and "plank" which put a load on her chest and back. 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 PFT 11/29/14- reviewed with her-very minimal mid flow airway obstruction suggested by contour of loop, minimal reduction of diffusion, normal lung volumes. CTa chest 11/22/14 IMPRESSION: Minimal enlargement of the ascending thoracic aorta to a maximal diameter of 3.9 cm, increased from 3.8 cm in 2010 and 3.7 cm in 2006. Otherwise, normal exam. Electronically Signed  By: Lorriane Shire M.D.  On: 11/22/2014 09:28 NPSG 05/11/14- AHI 0.6/ hr neg  Prior to Admission medications   Medication Sig Start Date End Date Taking? Authorizing Provider  ALPRAZolam (NIRAVAM) 0.25 MG dissolvable tablet Take 1 tablet (0.25 mg total) by mouth 3 (three) times daily as needed for anxiety. Patient not taking: Reported on 12/14/2014 08/23/14   Chauncey Cruel, MD  Ascorbic Acid (VITAMIN C) 1000 MG tablet Take 1,000 mg by mouth daily.    Historical Provider, MD  Calcium-Magnesium-Vitamin D (CALCIUM MAGNESIUM PO) Take by mouth daily. 4 tablets daily     Historical Provider, MD  Cholecalciferol (VITAMIN D) 2000 UNITS CAPS Take by mouth daily.     Historical Provider, MD  fish oil-omega-3 fatty acids 1000 MG capsule Take 2 g by mouth daily.      Historical Provider, MD  Indole-3-Carbinol POWD 400 mg by Does not apply route daily.    Historical Provider, MD  montelukast (SINGULAIR) 10 MG tablet Take 1 tablet (10 mg total) by mouth at bedtime. 12/14/14   Deneise Lever, MD  Probiotic Product (PROBIOTIC DAILY PO) Take by mouth daily.    Historical Provider, MD   Past Medical History  Diagnosis Date  . Cancer 2008    Breast  . Congenital anomaly of aortic arch   . Unspecified hereditary and idiopathic peripheral neuropathy   . Labyrinthitis, unspecified   . Unspecified hearing loss   . Unspecified tinnitus   . Malignant neoplasm of breast (female), unspecified site   . Other B-complex deficiencies   . Other malaise and fatigue   . Adhesive capsulitis of shoulder   . Aortic aneurysm    Past Surgical History  Procedure Laterality Date  . Breast lumpectomy  Oct.29 & Mar 19, 2007    Right  . Breast fibroadenoma surgery  1981    Left  . Wisdom teeth extracted    . Laparoscopy  1982    for endometriosis  . Abdominal hysterectomy  04/2010    robotic  . Basal cell carcinoma excision  2003    rt eye area   Family History  Problem Relation Age of Onset  . Seizures Mother   . Migraines Mother   . Hypertension Father   .  Other Father   . Hyperlipidemia Father   . Cancer Sister     breast  . Cancer Other     breast   Social History   Social History  . Marital Status: Married    Spouse Name: N/A  . Number of Children: 0  . 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 Smoking status: Never Smoker   . Smokeless tobacco: Never Used  . Alcohol Use: No     Comment: wine with drinner a few nights a week  . Drug Use: No  . Sexual Activity: Not on file   Other Topics Concern  . Not on file   Social History Narrative   HS[employer], many types of  education no formal degree. Married '85, no children. 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 Strong interest in herbology and alternative healing.     Review of Systems  HENT: Positive for postnasal drip and sneezing. Negative for congestion, dental problem, ear pain, nosebleeds, rhinorrhea, sinus pressure, sore throat and trouble swallowing.   Eyes: Negative for redness and itching.  Respiratory: Positive for shortness of breath. Negative for cough, chest tightness and wheezing.   Cardiovascular: Negative for palpitations and leg swelling.  Gastrointestinal: Negative for nausea and vomiting.  Genitourinary: Negative for dysuria.  Musculoskeletal: Negative for joint swelling.  Skin: Negative for rash.  Neurological: Negative for headaches.  Hematological: Does not bruise/bleed easily.  Psychiatric/Behavioral: Negative for dysphoric mood. The patient is not nervous/anxious.       Objective:   Physical Exam OBJ- Physical Exam General- Alert, Oriented, Affect-appropriate, Distress- none acute, trim, looks comfortable and well Skin- rash-none, lesions- none, excoriation- none Lymphadenopathy- none Head- atraumatic            Eyes- Gross vision intact, PERRLA, conjunctivae and secretions clear            Ears- Hearing, canals-normal            Nose- Clear, no-Septal dev, mucus, polyps, erosion, perforation             Throat- Mallampati II , mucosa clear , drainage- none, tonsils- atrophic Neck- flexible , trachea midline, no stridor , thyroid nl, carotid no bruit Chest - symmetrical excursion , unlabored           Heart/CV- RRR , no murmur , no gallop  , no rub, nl s1 s2                           - JVD- none , edema- none, stasis changes- none, varices- none           Lung- clear to P&A, wheeze- none, cough- none , dullness-none, rub- none           Chest wall-  Abd-  Br/ Gen/ Rectal- Not done, not  indicated Extrem- cyanosis- none, clubbing, none, atrophy- none, strength- nl Neuro- grossly intact to observation     Assessment & Plan:

## 2015-01-18 ENCOUNTER — Encounter: Payer: Self-pay | Admitting: Oncology

## 2015-02-02 ENCOUNTER — Encounter: Payer: Self-pay | Admitting: Oncology

## 2015-02-06 ENCOUNTER — Encounter: Payer: Self-pay | Admitting: Oncology

## 2015-02-15 ENCOUNTER — Telehealth: Payer: Self-pay

## 2015-02-15 ENCOUNTER — Other Ambulatory Visit: Payer: Self-pay

## 2015-02-15 DIAGNOSIS — F418 Other specified anxiety disorders: Secondary | ICD-10-CM

## 2015-02-15 DIAGNOSIS — C50911 Malignant neoplasm of unspecified site of right female breast: Secondary | ICD-10-CM

## 2015-02-15 MED ORDER — VENLAFAXINE HCL ER 37.5 MG PO CP24: 37.5000 mg | ORAL_CAPSULE | Freq: Every day | ORAL | Status: DC

## 2015-02-15 NOTE — Telephone Encounter (Signed)
Patient called requesting refill on effexor.  She would like to try the ER caps.  Prescription sent to [facility].

## 2015-02-24 ENCOUNTER — Ambulatory Visit (INDEPENDENT_AMBULATORY_CARE_PROVIDER_SITE_OTHER): Payer: 59

## 2015-02-24 DIAGNOSIS — Z23 Encounter for immunization: Secondary | ICD-10-CM | POA: Diagnosis not present

## 2015-04-15 ENCOUNTER — Telehealth: Payer: Self-pay | Admitting: *Deleted

## 2015-04-15 NOTE — Telephone Encounter (Signed)
[name] called to this RN to state noted weight loss of 16 lbs over the past 3 months. Weight loss is unintended.  Per discussion- [name] states she is " under a lot of stress "-  Pt's mother in law is under hospice and is living wth them.   " and David's sister has moved in and she is the most absolute difficult person ".  [name] states she is not sleeping well.  Her primary MD is retiring and [name] is reluctant to go to a new primary with concern " because I just do not want to be sent everywhere and Dr Jana Hakim knows me best "  [name] states her current weight is 116lbs with a usual weight of 136lbs.  Above will be given to MD for review and recommendations.  Return call number given as [phone].

## 2015-04-15 NOTE — Telephone Encounter (Signed)
Per MD review - overall symptoms are not suggestive for cancer recurrence- but are more probable due to current situations in her home.  Per discussion though MD would like to see her to evaluate and assess further.  MD recommended use of remeron which was discussed but Manus Gunning would prefer to see MD to discuss.  Appointment made by this RN.

## 2015-04-20 ENCOUNTER — Ambulatory Visit (HOSPITAL_BASED_OUTPATIENT_CLINIC_OR_DEPARTMENT_OTHER): Payer: 59

## 2015-04-20 ENCOUNTER — Ambulatory Visit (HOSPITAL_BASED_OUTPATIENT_CLINIC_OR_DEPARTMENT_OTHER): Payer: 59 | Admitting: Oncology

## 2015-04-20 ENCOUNTER — Ambulatory Visit (HOSPITAL_COMMUNITY)
Admission: RE | Admit: 2015-04-20 | Discharge: 2015-04-20 | Disposition: A | Discharge status: Home / Self Care | Payer: 59 | Source: Ambulatory Visit | Attending: Oncology | Admitting: Oncology

## 2015-04-20 ENCOUNTER — Other Ambulatory Visit: Payer: Self-pay

## 2015-04-20 ENCOUNTER — Telehealth: Payer: Self-pay | Admitting: Oncology

## 2015-04-20 VITALS — BP 126/82 | HR 74 | Temp 98.2°F | Resp 18 | Ht 66.0 in | Wt 122.8 lb

## 2015-04-20 DIAGNOSIS — C50511 Malignant neoplasm of lower-outer quadrant of right female breast: Secondary | ICD-10-CM

## 2015-04-20 DIAGNOSIS — R102 Pelvic and perineal pain: Secondary | ICD-10-CM | POA: Diagnosis not present

## 2015-04-20 DIAGNOSIS — R634 Abnormal weight loss: Secondary | ICD-10-CM

## 2015-04-20 DIAGNOSIS — G4700 Insomnia, unspecified: Secondary | ICD-10-CM

## 2015-04-20 DIAGNOSIS — Z170 Estrogen receptor positive status [ER+]: Secondary | ICD-10-CM

## 2015-04-20 DIAGNOSIS — Z853 Personal history of malignant neoplasm of breast: Secondary | ICD-10-CM

## 2015-04-20 DIAGNOSIS — C50911 Malignant neoplasm of unspecified site of right female breast: Secondary | ICD-10-CM

## 2015-04-20 DIAGNOSIS — F418 Other specified anxiety disorders: Secondary | ICD-10-CM

## 2015-04-20 LAB — CBC WITH DIFFERENTIAL/PLATELET
BASO%: 0.3 % (ref 0.0–2.0)
BASOS ABS: 0 10*3/uL (ref 0.0–0.1)
EOS ABS: 0.1 10*3/uL (ref 0.0–0.5)
EOS%: 0.9 % (ref 0.0–7.0)
HCT: 42.7 % (ref 34.8–46.6)
HEMOGLOBIN: 14.6 g/dL (ref 11.6–15.9)
LYMPH%: 22.6 % (ref 14.0–49.7)
MCH: 30.6 pg (ref 25.1–34.0)
MCHC: 34.2 g/dL (ref 31.5–36.0)
MCV: 89.5 fL (ref 79.5–101.0)
MONO#: 0.5 10*3/uL (ref 0.1–0.9)
MONO%: 6.8 % (ref 0.0–14.0)
NEUT#: 4.7 10*3/uL (ref 1.5–6.5)
NEUT%: 69.4 % (ref 38.4–76.8)
PLATELETS: 312 10*3/uL (ref 145–400)
RBC: 4.77 10*6/uL (ref 3.70–5.45)
RDW: 12.6 % (ref 11.2–14.5)
WBC: 6.8 10*3/uL (ref 3.9–10.3)
lymph#: 1.5 10*3/uL (ref 0.9–3.3)

## 2015-04-20 LAB — URINALYSIS, MICROSCOPIC - CHCC
Bilirubin (Urine): NEGATIVE
Glucose: NEGATIVE mg/dL
Ketones: NEGATIVE mg/dL
Nitrite: NEGATIVE
PROTEIN: NEGATIVE mg/dL
SPECIFIC GRAVITY, URINE: 1.02 (ref 1.003–1.035)
Urobilinogen, UR: 0.2 mg/dL (ref 0.2–1)
pH: 5 (ref 4.6–8.0)

## 2015-04-20 LAB — COMPREHENSIVE METABOLIC PANEL
ALBUMIN: 4.3 g/dL (ref 3.5–5.0)
ALK PHOS: 67 U/L (ref 40–150)
ALT: 16 U/L (ref 0–55)
ANION GAP: 11 meq/L (ref 3–11)
AST: 21 U/L (ref 5–34)
BUN: 17.7 mg/dL (ref 7.0–26.0)
CO2: 25 mEq/L (ref 22–29)
CREATININE: 0.8 mg/dL (ref 0.6–1.1)
Calcium: 9.4 mg/dL (ref 8.4–10.4)
Chloride: 103 mEq/L (ref 98–109)
EGFR: 80 mL/min/{1.73_m2} — AB (ref >90)
Glucose: 98 mg/dl (ref 70–140)
Potassium: 4 mEq/L (ref 3.5–5.1)
Sodium: 139 mEq/L (ref 136–145)
Total Bilirubin: 0.38 mg/dL (ref 0.20–1.20)
Total Protein: 7.3 g/dL (ref 6.4–8.3)

## 2015-04-20 MED ORDER — CLONAZEPAM 0.5 MG PO TABS: 0.5000 mg | ORAL_TABLET | Freq: Two times a day (BID) | ORAL | Status: DC | PRN

## 2015-04-20 NOTE — Progress Notes (Signed)
ID: [name]   DOB: 17-Dec-1960  MR#: [id number]  WUJ#:[id number]  PCP: Unice Cobble, MD GYN: Delsa Bern SU: OTHER MD: Radene Journey, Enrique Sack, Robert Buccini  HISTORY OF PRESENT ILLNESS: From the original intake note:  [name] is followed very closely mammographically because of a strong family history of breast cancer which is detailed below.  She had a right breast ultrasound in January 2008 for follow up of a right breast fibroadenoma (this found no significant change as compared to January 2007 or July 2007).    On 11-08-06 she had bilateral diagnostic mammograms and ultrasounds.  She does have dense parenchyma on the right.  There were no findings suspicious for malignancy and the fibroadenoma which was being followed was again found to be unchanged.  In the left breast there were two smoothly  marginated masses which were new compared to prior and they were both found to be cysts by ultrasound.  Because of the patient's high baseline cancer risk, bilateral MRI's were again obtained on 11-25-06 (the prior MRI I believe had been two years before).  Again, the likely fibroadenoma in the lateral aspect of the right breast was noted.  More importantly, a 1.1 cm. enhancing nodule in the inferior aspect of the right breast appeared slightly larger than previously with the suggestion of very minimal margin irregularity.  There were no abnormal appearing lymph nodes and incidentally no liver abnormality was noted.  Because of this change, the focused right breast ultrasound was obtained on  11-28-06.  This showed a 1.4 cm. hypoechoic area where the MRI abnormality was found however, a little bit more laterally than expected, and for that reason MRI biopsy was preferred.  This was performed on 01-20-07 and showed ([id number]) a high grade ductal carcinoma in situ which was 99% ER and 98% PR positive.   Her subsequent history is as detailed below  INTERVAL HISTORY: [name] returns  today for followup of her  Estrogen receptor positive breast cancer.  She was not able to tolerate anti-estrogens so she is observation alone.   She called recently stating that she had lost 17 pounds and could not sleep. Tentatively we called in Remeron for her but I also asked her to come in today to discuss that further.  REVIEW OF SYSTEMS:  friends current situation is difficult. Her husband [name]'s mother, who is in her 24s, is under hospice care in their home. Things were difficult but " otherwise perfect" until [name]'s sister came in and in some way that has disrupted the situation greatly and greatly increased distress. This is when [name] started losing weight and not being able to sleep. In addition she has some pelvic discomfort, not associated with urination. There has been no hematuria or dysuria. She admits to being anxious and depressed, feeling that this is situational.  She tried taking some of her Xanax, but that made things worse. She found that Klonopin work better for her.Just some seasonal allergies. Otherwise a detailed review of systems today was stable  PAST MEDICAL HISTORY: Past Medical History  Diagnosis Date  . Cancer 2008    Breast  . Congenital anomaly of aortic arch   . Unspecified hereditary and idiopathic peripheral neuropathy   . Labyrinthitis, unspecified   . Unspecified hearing loss   . Unspecified tinnitus   . Malignant neoplasm of breast (female), unspecified site   . Other B-complex deficiencies   . Other malaise and fatigue   . Adhesive capsulitis of shoulder   .  Aortic aneurysm   1. Removal of a left breast fibroadenoma in 1980. 2. History of endometriosis diagnosed by laparoscopy I believe in 2003. 3. History of right endometrial artery embolization for fibroid control by Interventional Radiology in April 2008. 4. History of basal cell carcinoma removed from the area beneath the left orbit, status post Mohs surgery.  5. History of adhesive capsulitis of  both shoulders. 6. History of an ascending aortic aneurysm measuring 3.7 cm.  PAST SURGICAL HISTORY: Past Surgical History  Procedure Laterality Date  . Breast lumpectomy  Oct.29 & Mar 19, 2007    Right  . Breast fibroadenoma surgery  1981    Left  . Wisdom teeth extracted    . Laparoscopy  1982    for endometriosis  . Abdominal hysterectomy  04/2010    robotic  . Basal cell carcinoma excision  2003    rt eye area  s/p TAH-BSO 05/11/2010  FAMILY HISTORY Family History  Problem Relation Age of Onset  . Seizures Mother   . Migraines Mother   . Hypertension Father   . Other Father   . Hyperlipidemia Father   . Cancer Sister     breast  . Cancer Other     breast  The patient has one full sister who was diagnosed with breast cancer at the age of 46.  She is now 16 and doing very well.  She has a half brother who is 71.  The patient's mother committed suicide at the age of 16.  She did have fibrocystic change but, of course, we would not know if she had lived to develop breast cancer.  The patient's mother's mother however, died from breast cancer at the age of 30.  It had been diagnosed when she was 14.  That maternal grandmother had an additional six sisters, one of whom also had breast cancer, the patient does not know at what age.  On the patient's father's side, the father had two sisters, one of them developed breast cancer in her 29s and apparently died from metastatic breast cancer. There is a history of stomach cancer on her father's side and of bladder cancer on her mother's side, but this is now two degrees removed.  There is no history of ovarian cancer anywhere in the family to the patient's knowledge.  GYNECOLOGIC HISTORY: She is GX P0. She is s/p TAH BSO as of January 2012, with benign pathology  SOCIAL HISTORY: She is a Education administrator.  She is married to [name] who works as a Engineering geologist.  They have six cats.  She is not a church attender but describes  herself as a very spiritual person.     ADVANCED DIRECTIVES: her husband is HCPOA  HEALTH MAINTENANCE: Social History  Substance Use Topics  . Smoking status: Never Smoker   . Smokeless tobacco: Never Used  . Alcohol Use: No     Comment: wine with drinner a few nights a week     Colonoscopy:  PAP:  Bone density:  Lipid panel:  Allergies  Allergen Reactions  . Bee Venom Anaphylaxis  . Polysorbate Anaphylaxis    "Polysorbate 80 preservative"  . Iohexol      Desc: Pt states that she developed itching after receiving Magnevist-MRI contrast.   . Methylprednisolone     Current Outpatient Prescriptions  Medication Sig Dispense Refill  . ALPRAZolam (NIRAVAM) 0.25 MG dissolvable tablet Take 1 tablet (0.25 mg total) by mouth 3 (three) times daily as needed  for anxiety. (Patient not taking: Reported on 12/14/2014) 60 tablet 0  . Ascorbic Acid (VITAMIN C) 1000 MG tablet Take 1,000 mg by mouth daily.    . Calcium-Magnesium-Vitamin D (CALCIUM MAGNESIUM PO) Take by mouth daily. 4 tablets daily     . Cholecalciferol (VITAMIN D) 2000 UNITS CAPS Take by mouth daily.    . fish oil-omega-3 fatty acids 1000 MG capsule Take 2 g by mouth daily.      . Indole-3-Carbinol POWD 400 mg by Does not apply route daily.    . montelukast (SINGULAIR) 10 MG tablet Take 1 tablet (10 mg total) by mouth at bedtime. 30 tablet 11  . Probiotic Product (PROBIOTIC DAILY PO) Take by mouth daily.    Marland Kitchen venlafaxine XR (EFFEXOR-XR) 37.5 MG 24 hr capsule Take 1 capsule (37.5 mg total) by mouth daily with breakfast. 60 capsule 3   No current facility-administered medications for this visit.    OBJECTIVE: Middle-aged white woman  We'll appears stated age  50 Vitals:   04/20/15 1428  BP: 126/82  Pulse: 74  Temp: 98.2 F (36.8 C)  Resp: 18     Body mass index is 19.83 kg/(m^2).    ECOG FS: 0 Filed Weights   04/20/15 1428  Weight: 122 lb 12.8 oz (55.702 kg)   Sclerae unicteric, pupils round and equal Oropharynx  clear and moist-- no thrush or other lesions No cervical or supraclavicular adenopathy Lungs no rales or rhonchi Heart regular rate and rhythm Abd soft, nontender, positive bowel sounds MSK no focal spinal tenderness, no upper extremity lymphedema Neuro: nonfocal, well oriented, appropriate affect Breasts:  The right breast is status post lumpectomy. There is no evidence of recurrence. The right axilla is benign. The left breast is unremarkable.  LAB RESULTS: Lab Results  Component Value Date   WBC 6.4 08/16/2014   NEUTROABS 3.8 08/16/2014   HGB 14.7 08/16/2014   HCT 44.5 08/16/2014   MCV 90.6 08/16/2014   PLT 295 08/16/2014      Chemistry      Component Value Date/Time   NA 143 08/16/2014 0811   NA 141 03/30/2014 1018   K 4.1 08/16/2014 0811   K 3.9 03/30/2014 1018   CL 108 03/30/2014 1018   CL 105 08/20/2012 0809   CO2 22 08/16/2014 0811   CO2 24 03/30/2014 1018   BUN 12.4 08/16/2014 0811   BUN 17 03/30/2014 1018   CREATININE 0.8 08/16/2014 0811   CREATININE 0.8 03/30/2014 1018      Component Value Date/Time   CALCIUM 9.1 08/16/2014 0811   CALCIUM 8.8 03/30/2014 1018   ALKPHOS 80 08/16/2014 0811   ALKPHOS 61 08/14/2011 1016   AST 20 08/16/2014 0811   AST 21 08/14/2011 1016   ALT 18 08/16/2014 0811   ALT 13 08/14/2011 1016   BILITOT 0.21 08/16/2014 0811   BILITOT 0.3 08/14/2011 1016       Lab Results  Component Value Date   LABCA2 38 11/06/2010     STUDIES: No results found.   ASSESSMENT: 54 y.o. [place] woman   (1)  status post right lumpectomy and sentinel lymph node biopsy October of 2008 for a 7 mm grade 1 invasive ductal carcinoma, strongly estrogen and progesterone receptor positive, HER2 negative, with an Oncotype recurrence score of 20, predicting a recurrence of 13% after 5 years on tamoxifen,   (2)  status post Abraxane given weekly x8 completed February 2009, followed by radiation completed April 2009, at which time she started  tamoxifen.    (3)  She has also tried aromatase inhibitors, namely letrozole and exemestane, with no success.  She resumed tamoxifen October 2012, with poor tolerance; held it for six months, then tried again October 2013, finally went off it permanently April 2014  PLAN:  [name] has lost a lot of weight Lelon Frohlich is exhausted, but it could all be secondary to stress. I really think that it would be good for the family if the stress issue could be resolved. Perhaps [name]'s sister could stay in a hotel for a few days or perhaps [name]'s mother could go to respite care for a few days. I think all it would take is for 3 or 4 days of [name] to be able to be in her own home at peace that it would make and in Norma's difference.  In the meantime though we are starting her on Remeron half dose. She will be taking that at 15 mg in the evening. It is fine for her to continue the Effexor which she takes at 37.5 mg. These are both very low doses and I do not expect she would have significant side effects from taking both of them although that was discussed today.  she also requested a prescription for Klonopin, which I was glad to fillout for her   she is very concerned that she may be having a recurrence. This is not unreasonable giving the symptomatology. She is very scared about radiation exposure so she did not want to get a chest CT scan. The compromises obtaining lab work today including a urinalysis with culture and a chest x-ray.   Otherwise she will return to see me in June. She knows to call for any problems that may develop before that visit.     Yzabelle Calles C    04/20/2015

## 2015-04-20 NOTE — Telephone Encounter (Signed)
Appointments made and avs printed for patient °

## 2015-04-21 ENCOUNTER — Encounter: Payer: Self-pay | Admitting: Oncology

## 2015-04-21 ENCOUNTER — Telehealth: Payer: Self-pay | Admitting: *Deleted

## 2015-04-21 ENCOUNTER — Other Ambulatory Visit: Payer: Self-pay

## 2015-04-21 DIAGNOSIS — C50511 Malignant neoplasm of lower-outer quadrant of right female breast: Secondary | ICD-10-CM

## 2015-04-21 DIAGNOSIS — F418 Other specified anxiety disorders: Secondary | ICD-10-CM

## 2015-04-21 LAB — URINE CULTURE

## 2015-04-21 LAB — CANCER ANTIGEN 27.29: CA 27.29: 35 U/mL (ref 0–39)

## 2015-04-21 MED ORDER — MIRTAZAPINE 15 MG PO TABS: 15.0000 mg | ORAL_TABLET | Freq: Every day | ORAL | Status: DC

## 2015-04-21 NOTE — Telephone Encounter (Signed)
VM message from patient stating that she saw Dr. Jana Hakim yesterday and was told that he would start her on Remeron but pt states the pharmacy told her the prescription has not been called in yet. She uses [facility]

## 2015-04-27 ENCOUNTER — Other Ambulatory Visit: Payer: Self-pay | Admitting: Nurse Practitioner

## 2015-04-27 ENCOUNTER — Encounter: Payer: Self-pay | Admitting: Oncology

## 2015-05-09 DIAGNOSIS — F439 Reaction to severe stress, unspecified: Secondary | ICD-10-CM | POA: Diagnosis not present

## 2015-05-09 DIAGNOSIS — K5289 Other specified noninfective gastroenteritis and colitis: Secondary | ICD-10-CM | POA: Diagnosis not present

## 2015-05-20 MED FILL — VENLAFAXINE HCL 37.5 MG TAB: 37.5 | 30 days supply | Qty: 60 | Fill #1

## 2015-06-02 ENCOUNTER — Telehealth: Payer: 59 | Admitting: Physician Assistant

## 2015-06-02 DIAGNOSIS — R399 Unspecified symptoms and signs involving the genitourinary system: Secondary | ICD-10-CM

## 2015-06-02 MED ORDER — CIPROFLOXACIN HCL 500 MG PO TABS: 500.0000 mg | ORAL_TABLET | Freq: Two times a day (BID) | ORAL | Status: DC

## 2015-06-02 MED FILL — CIPROFLOXACIN HCL 500 MG TA: 500 | 5 days supply | Qty: 10 | Fill #0

## 2015-06-02 NOTE — Progress Notes (Signed)

## 2015-06-17 ENCOUNTER — Other Ambulatory Visit: Payer: Self-pay | Admitting: Oncology

## 2015-06-17 MED FILL — VENLAFAXINE HCL ER 37.5 MG: 37.5 | 60 days supply | Qty: 60 | Fill #2

## 2015-06-21 MED FILL — clonazePAM 0.5 MG TABS: 0.5 | 15 days supply | Qty: 30 | Fill #0

## 2015-08-15 MED FILL — VENLAFAXINE HCL ER 37.5 MG: 37.5 | 60 days supply | Qty: 60 | Fill #3

## 2015-08-18 ENCOUNTER — Other Ambulatory Visit: Payer: 59

## 2015-08-25 ENCOUNTER — Ambulatory Visit: Payer: 59 | Admitting: Oncology

## 2015-09-27 MED FILL — clonazePAM 0.5 MG TABS: 0.5 | 15 days supply | Qty: 30 | Fill #1

## 2015-10-10 ENCOUNTER — Other Ambulatory Visit: Payer: Self-pay | Admitting: Oncology

## 2015-10-12 MED FILL — VENLAFAXINE HCL ER 37.5 MG: 37.5 | 60 days supply | Qty: 60 | Fill #0

## 2015-11-02 ENCOUNTER — Telehealth: Payer: Self-pay | Admitting: Oncology

## 2015-11-02 ENCOUNTER — Other Ambulatory Visit: Payer: Self-pay

## 2015-11-02 DIAGNOSIS — C50511 Malignant neoplasm of lower-outer quadrant of right female breast: Secondary | ICD-10-CM

## 2015-11-02 NOTE — Telephone Encounter (Signed)
returned call and confirmed appt..... °

## 2015-11-02 NOTE — Telephone Encounter (Signed)
pt cld to get time & date of appts-gave pt both appt times & dates

## 2015-11-03 ENCOUNTER — Other Ambulatory Visit (HOSPITAL_BASED_OUTPATIENT_CLINIC_OR_DEPARTMENT_OTHER): Payer: 59

## 2015-11-03 DIAGNOSIS — C50511 Malignant neoplasm of lower-outer quadrant of right female breast: Secondary | ICD-10-CM

## 2015-11-03 DIAGNOSIS — Z853 Personal history of malignant neoplasm of breast: Secondary | ICD-10-CM | POA: Diagnosis not present

## 2015-11-03 LAB — COMPREHENSIVE METABOLIC PANEL
ALT: 24 U/L (ref 0–55)
AST: 27 U/L (ref 5–34)
Albumin: 4.1 g/dL (ref 3.5–5.0)
Alkaline Phosphatase: 72 U/L (ref 40–150)
Anion Gap: 11 mEq/L (ref 3–11)
BUN: 23.2 mg/dL (ref 7.0–26.0)
CALCIUM: 9.1 mg/dL (ref 8.4–10.4)
CHLORIDE: 103 meq/L (ref 98–109)
CO2: 27 mEq/L (ref 22–29)
CREATININE: 0.8 mg/dL (ref 0.6–1.1)
EGFR: 78 mL/min/{1.73_m2} — ABNORMAL LOW (ref >90)
GLUCOSE: 107 mg/dL (ref 70–140)
SODIUM: 140 meq/L (ref 136–145)
Total Bilirubin: 0.41 mg/dL (ref 0.20–1.20)
Total Protein: 7.3 g/dL (ref 6.4–8.3)

## 2015-11-03 LAB — CBC WITH DIFFERENTIAL/PLATELET
BASO%: 0.6 % (ref 0.0–2.0)
Basophils Absolute: 0 10*3/uL (ref 0.0–0.1)
EOS ABS: 0.2 10*3/uL (ref 0.0–0.5)
EOS%: 2.8 % (ref 0.0–7.0)
HCT: 42.8 % (ref 34.8–46.6)
HEMOGLOBIN: 14.6 g/dL (ref 11.6–15.9)
LYMPH%: 34.8 % (ref 14.0–49.7)
MCH: 30.9 pg (ref 25.1–34.0)
MCHC: 34.1 g/dL (ref 31.5–36.0)
MCV: 90.5 fL (ref 79.5–101.0)
MONO#: 0.4 10*3/uL (ref 0.1–0.9)
MONO%: 8.3 % (ref 0.0–14.0)
NEUT%: 53.5 % (ref 38.4–76.8)
NEUTROS ABS: 2.9 10*3/uL (ref 1.5–6.5)
Platelets: 312 10*3/uL (ref 145–400)
RBC: 4.73 10*6/uL (ref 3.70–5.45)
RDW: 12.5 % (ref 11.2–14.5)
WBC: 5.3 10*3/uL (ref 3.9–10.3)
lymph#: 1.9 10*3/uL (ref 0.9–3.3)

## 2015-11-08 ENCOUNTER — Other Ambulatory Visit: Payer: Self-pay | Admitting: *Deleted

## 2015-11-08 DIAGNOSIS — I712 Thoracic aortic aneurysm, without rupture, unspecified: Secondary | ICD-10-CM

## 2015-11-10 ENCOUNTER — Ambulatory Visit (HOSPITAL_BASED_OUTPATIENT_CLINIC_OR_DEPARTMENT_OTHER): Payer: 59 | Admitting: Oncology

## 2015-11-10 ENCOUNTER — Telehealth: Payer: Self-pay | Admitting: Oncology

## 2015-11-10 VITALS — BP 123/80 | HR 70 | Temp 98.6°F | Resp 18 | Ht 66.0 in | Wt 123.9 lb

## 2015-11-10 DIAGNOSIS — Z853 Personal history of malignant neoplasm of breast: Secondary | ICD-10-CM | POA: Diagnosis not present

## 2015-11-10 DIAGNOSIS — F418 Other specified anxiety disorders: Secondary | ICD-10-CM | POA: Diagnosis not present

## 2015-11-10 DIAGNOSIS — C50511 Malignant neoplasm of lower-outer quadrant of right female breast: Secondary | ICD-10-CM

## 2015-11-10 MED ORDER — CLONAZEPAM 0.5 MG PO TABS: 0.5000 mg | ORAL_TABLET | Freq: Two times a day (BID) | ORAL | Status: DC | PRN

## 2015-11-10 MED ORDER — MIRTAZAPINE 15 MG PO TABS: 15.0000 mg | ORAL_TABLET | Freq: Every day | ORAL | Status: DC

## 2015-11-10 MED FILL — clonazePAM 0.5 MG TABS: 0.5 | 15 days supply | Qty: 30 | Fill #2

## 2015-11-10 MED FILL — MIRTAZAPINE 15 MG TABLET: 15 | 30 days supply | Qty: 30 | Fill #0
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
## 2015-11-10 NOTE — Telephone Encounter (Signed)
appt made and avs printed °

## 2015-11-18 ENCOUNTER — Ambulatory Visit
Admission: RE | Admit: 2015-11-18 | Discharge: 2015-11-18 | Disposition: A | Discharge status: Home / Self Care | Payer: 59 | Source: Ambulatory Visit | Attending: Oncology | Admitting: Oncology

## 2015-11-18 DIAGNOSIS — C50511 Malignant neoplasm of lower-outer quadrant of right female breast: Secondary | ICD-10-CM

## 2015-11-18 DIAGNOSIS — Z1231 Encounter for screening mammogram for malignant neoplasm of breast: Secondary | ICD-10-CM | POA: Diagnosis not present

## 2015-12-07 ENCOUNTER — Ambulatory Visit (INDEPENDENT_AMBULATORY_CARE_PROVIDER_SITE_OTHER): Payer: 59 | Admitting: Surgery

## 2015-12-07 ENCOUNTER — Ambulatory Visit
Admission: RE | Admit: 2015-12-07 | Discharge: 2015-12-07 | Disposition: A | Discharge status: Home / Self Care | Payer: 59 | Source: Ambulatory Visit | Attending: Surgery | Admitting: Surgery

## 2015-12-07 VITALS — BP 114/70 | HR 80 | Ht 66.0 in | Wt 123.0 lb

## 2015-12-07 DIAGNOSIS — I712 Thoracic aortic aneurysm, without rupture, unspecified: Secondary | ICD-10-CM

## 2015-12-07 DIAGNOSIS — I7121 Aneurysm of the ascending aorta, without rupture: Secondary | ICD-10-CM

## 2015-12-07 MED ORDER — GADOBENATE DIMEGLUMINE 529 MG/ML IV SOLN
10.0000 mL | Freq: Once | INTRAVENOUS | Status: AC | PRN
  Administered 2015-12-07: 10 mL via INTRAVENOUS

## 2015-12-07 NOTE — Progress Notes (Signed)
HPI:  The patient returns for follow up of a 3.9 cm fusiform ascending aneurysm. It was discovered on breast MRI in 10/2004. It was read as measuring 4 cm at that time. She underwent CTA of the chest in 11/2004 which showed the ascending aorta to measure 3.7 cm. CTA in 02/2009 measured 3.8 cm. Her current CTA from 11/22/2014 showed the ascending aorta to be 39 mm. She was reportedly referred to [facility] in the past for a medical genetics evaluation to rule out hereditary aneurysm disease and was ruled out for Marfans and Ehlers-Danlos. She reportedly had a second degree relative with Ehlers-Danlos. Since I saw her a year ago she has been under a lot of stress providing 24 hr care for her mother-in-law at home who died a few weeks ago. She feels like that stress is gone and she is much relieved. She denies any chest or back pain.  Current Outpatient Prescriptions  Medication Sig Dispense Refill  . Ascorbic Acid (VITAMIN C) 1000 MG tablet Take 1,000 mg by mouth daily.    . Cholecalciferol (VITAMIN D) 2000 UNITS CAPS Take by mouth daily.    . clonazePAM (KLONOPIN) 0.5 MG tablet Take 1 tablet (0.5 mg total) by mouth 2 (two) times daily as needed. for anxiety 60 tablet 0  . fish oil-omega-3 fatty acids 1000 MG capsule Take 2 g by mouth daily.      . Indole-3-Carbinol POWD 400 mg by Does not apply route daily.    . magnesium 30 MG tablet Take 30 mg by mouth 2 (two) times daily.    . montelukast (SINGULAIR) 10 MG tablet Take 1 tablet (10 mg total) by mouth at bedtime. 30 tablet 11  . Probiotic Product (PROBIOTIC DAILY PO) Take by mouth daily.    Marland Kitchen thiamine (VITAMIN B-1) 100 MG tablet Take 100 mg by mouth daily.    Marland Kitchen venlafaxine XR (EFFEXOR-XR) 37.5 MG 24 hr capsule TAKE 1 CAPSULE BY MOUTH ONCE DAILY WITH BREAKFAST 60 capsule 3   No current facility-administered medications for this visit.      Physical Exam: BP 114/70 (BP Location: Left Arm, Patient Position: Sitting, Cuff Size: Small)   Pulse 80    Ht 5\' 6"  (1.676 m)   Wt 123 lb (55.8 kg)   SpO2 97% Comment: RA  BMI 19.85 kg/m  She looks well Lung exam is clear Cardiac exam shows a regular rate and rhythm with normal heart sounds. There is no murmur.  Diagnostic Tests:  CLINICAL DATA:  55 year old female with known aortic aneurysm of the ascending thoracic aorta. Annual evaluation  EXAM: MRA CHEST WITH OR WITHOUT CONTRAST  TECHNIQUE: Angiographic images of the chest were obtained using MRA technique without and with intravenous contrast.  CONTRAST:  68mL MULTIHANCE GADOBENATE DIMEGLUMINE 529 MG/ML IV SOLN  COMPARISON:  Prior CT scan of the chest 11/22/2014  FINDINGS: VASCULAR  Stable fusiform aneurysmal dilatation of the tubular portion of the ascending thoracic aorta with a maximal diameter of 3.9 - 4.0 cm which is insignificantly changed compared to prior. No effacement of the sino-tubular junction. The aortic root remains within normal limits. No aneurysmal dilatation of the arch or descending thoracic aorta. Stable appearance of a smoothly contoured out pouching of the inner curvature of the proximal descending thoracic aorta and the region of the ligamentum arterial some. This configuration remains consistent with a ductus diverticulum. The heart is normal in size. No pericardial effusion. Normal caliber pulmonary arteries. No central pulmonary embolus.  NON VASCULAR  No abnormal signal or enhancement within the mediastinum, lungs, visualized upper abdomen or musculoskeletal structures.  IMPRESSION: 1. Stable mild fusiform aneurysmal dilatation of the ascending thoracic aorta with a maximal diameter of 3.9 - 4.0 cm. No effacement of the sino-tubular junction in the aortic root remains within normal limits. 2. Stable ductus diverticulum.  Signed,  Criselda Peaches, MD  [facility]  [facility]   Electronically Signed   By: Jacqulynn Cadet M.D.   On: 12/07/2015 09:40  Impression:  The ascending aneurysm is unchanged at 3.9-4.0 cm and has been minimally changed since it was diagnosed in 2006. Her BP is under good control. There is no indication for surgical treatment at this time. I would do a follow up MRA yearly. I reviewed the current scan with her and her husband and answered their questions.  Plan:  Follow up in one year with MRA chest.   Gaye Pollack, MD [facility] [phone]

## 2015-12-09 MED FILL — VENLAFAXINE HCL ER 37.5 MG: 37.5 | 60 days supply | Qty: 60 | Fill #1

## 2016-02-06 MED FILL — VENLAFAXINE HCL ER 37.5 MG: 37.5 | 60 days supply | Qty: 60 | Fill #2

## 2016-02-07 ENCOUNTER — Other Ambulatory Visit: Payer: Self-pay | Admitting: Oncology

## 2016-02-07 MED FILL — clonazePAM 0.5 MG TABS: 0.5 | 15 days supply | Qty: 30 | Fill #0

## 2016-04-05 MED FILL — VENLAFAXINE HCL ER 37.5 MG: 37.5 | 60 days supply | Qty: 60 | Fill #3

## 2016-04-09 DIAGNOSIS — H5201 Hypermetropia, right eye: Secondary | ICD-10-CM | POA: Diagnosis not present

## 2016-04-09 DIAGNOSIS — H52203 Unspecified astigmatism, bilateral: Secondary | ICD-10-CM | POA: Diagnosis not present

## 2016-04-09 DIAGNOSIS — H524 Presbyopia: Secondary | ICD-10-CM | POA: Diagnosis not present

## 2016-04-16 ENCOUNTER — Encounter (HOSPITAL_COMMUNITY): Payer: Self-pay

## 2016-04-16 ENCOUNTER — Ambulatory Visit (HOSPITAL_COMMUNITY)
Admission: RE | Admit: 2016-04-16 | Discharge: 2016-04-16 | Disposition: A | Discharge status: Home / Self Care | Payer: 59 | Source: Ambulatory Visit | Attending: Interventional Radiology | Admitting: Interventional Radiology

## 2016-04-16 ENCOUNTER — Other Ambulatory Visit (HOSPITAL_COMMUNITY): Payer: Self-pay | Admitting: Interventional Radiology

## 2016-04-16 DIAGNOSIS — R51 Headache: Secondary | ICD-10-CM | POA: Diagnosis not present

## 2016-04-16 DIAGNOSIS — H93A9 Pulsatile tinnitus, unspecified ear: Secondary | ICD-10-CM

## 2016-04-16 DIAGNOSIS — R519 Headache, unspecified: Secondary | ICD-10-CM

## 2016-04-16 DIAGNOSIS — H9201 Otalgia, right ear: Secondary | ICD-10-CM | POA: Diagnosis not present

## 2016-04-16 DIAGNOSIS — H9313 Tinnitus, bilateral: Secondary | ICD-10-CM | POA: Diagnosis not present

## 2016-04-16 DIAGNOSIS — G500 Trigeminal neuralgia: Secondary | ICD-10-CM | POA: Diagnosis not present

## 2016-04-16 HISTORY — PX: IR GENERIC HISTORICAL: IMG1180011

## 2016-04-16 MED ORDER — GADOBENATE DIMEGLUMINE 529 MG/ML IV SOLN
15.0000 mL | Freq: Once | INTRAVENOUS | Status: AC
  Administered 2016-04-16: 12 mL via INTRAVENOUS

## 2016-04-17 ENCOUNTER — Encounter (HOSPITAL_COMMUNITY): Payer: Self-pay | Admitting: Interventional Radiology

## 2016-04-19 DIAGNOSIS — D225 Melanocytic nevi of trunk: Secondary | ICD-10-CM | POA: Diagnosis not present

## 2016-04-19 DIAGNOSIS — L905 Scar conditions and fibrosis of skin: Secondary | ICD-10-CM | POA: Diagnosis not present

## 2016-04-19 DIAGNOSIS — Z85828 Personal history of other malignant neoplasm of skin: Secondary | ICD-10-CM | POA: Diagnosis not present

## 2016-04-19 DIAGNOSIS — L570 Actinic keratosis: Secondary | ICD-10-CM | POA: Diagnosis not present

## 2016-04-19 DIAGNOSIS — L814 Other melanin hyperpigmentation: Secondary | ICD-10-CM | POA: Diagnosis not present

## 2016-04-24 ENCOUNTER — Telehealth: Payer: Self-pay | Admitting: Internal Medicine

## 2016-04-24 NOTE — Telephone Encounter (Signed)
Pt's husband sees Dr Yong Channel, and pt used to see Dr Linda Hedges.  Pt states she saw Dr Sharlet Salina one time and did not realize she would have to return to her, since she was seen for acute issue only once. Pt went to UC last week for ear pain and now states she has a vascular loop. (MRS was done)  Pt has no one to follow up with, or help with this dx. So needed/wanted to see Dr Yong Channel asap. Used a same day on Thursday, 1/04 at 3:30 pm.  Please let me know if this is not ok.

## 2016-04-25 NOTE — Telephone Encounter (Signed)
If I have an open slot of course she could see me. I am not able to review the records the patient is talking about (not in epic or care everywhere) so would need those for more information to respond about this vascular loop?

## 2016-04-25 NOTE — Telephone Encounter (Signed)
Who is the ordering physician? THey need to help with follow up of this issue? Do we have records for this?  I would also ask PCP at present, Dr. Sharlet Salina to weigh in to help with follow up (though I am happy to accept patient at time of appointment) and see if she is available to see patient before next week to discuss (I am out of town so cannot see patient this week)

## 2016-05-03 ENCOUNTER — Encounter: Payer: Self-pay | Admitting: Family Medicine

## 2016-05-03 ENCOUNTER — Ambulatory Visit (INDEPENDENT_AMBULATORY_CARE_PROVIDER_SITE_OTHER): Payer: 59 | Admitting: Family Medicine

## 2016-05-03 VITALS — BP 138/88 | HR 79 | Temp 98.4°F | Ht 66.5 in | Wt 136.4 lb

## 2016-05-03 DIAGNOSIS — H9311 Tinnitus, right ear: Secondary | ICD-10-CM | POA: Diagnosis not present

## 2016-05-03 DIAGNOSIS — H9201 Otalgia, right ear: Secondary | ICD-10-CM | POA: Diagnosis not present

## 2016-05-03 DIAGNOSIS — I719 Aortic aneurysm of unspecified site, without rupture: Secondary | ICD-10-CM | POA: Insufficient documentation

## 2016-05-03 DIAGNOSIS — H93A2 Pulsatile tinnitus, left ear: Secondary | ICD-10-CM

## 2016-05-03 DIAGNOSIS — I7121 Aneurysm of the ascending aorta, without rupture: Secondary | ICD-10-CM | POA: Insufficient documentation

## 2016-05-03 NOTE — Patient Instructions (Addendum)
Sign release of information at the check out desk for pap smear from gynecology  Keep me updated on how things go with [facility] and let me know if there is anything I can do to help  Consider seeing Dr. Lucia Gaskins for his opinion and any options for pain management

## 2016-05-03 NOTE — Assessment & Plan Note (Addendum)
Right ear ache Pulsatile tinnitus left ear Tinnitus right ear S: Patient recently seen on December 18th at urgent care [place] [address] [facility]) for a bothersome chronic earache. She has always had tinnitus in that ear since she had chemotherapy in 2009 for breast cancer but this intensified. Pain rated 5/10 aching. Worse with walking on treadmill. She states feels like "Really bad ear congestion, sinus infection and like needs to pull a tooth". Her pain also in the past has been down in her jaw more- Saw Dentist in February 2016 for possible TMJ for pain in right jaw- wears mouthguard. She has seen ENT in past- Dr. Cresenciano Lick ENT 2009 after breast cancer. Dr. Lucia Gaskins -[facility] last few years- right ear. Was told needed hearing aids. Hearing aid did not help. She has not returned to see him.   It was also noted at time of her urgent care visit that she had Pulsatile tinnitus left ear where she "Hears heart beat all the time left ear " which was new- as a result she had a brain MRA ordered which showed a vascular loop but this was on the right side. Getting Set up with [place] appointment. Suggested by Dr. Patrecia Pour of IR who performed the read on the MRA. Of note, patient's husband is a PA with neurology and was able to help get this MRA set up thankfully. She has tried Has tried heat/ice/essential oils. Naprosyn and tylenol make ears ring worse A/P: Tinnitus and ear ache right ear with new vascular loop found on MRI pending [facility] neurosurgery evaluation. We had an extended discussion today about her concerns and questions. Also went through potential treatment options with no clear helpful option being discovered. Did advise her to see ENT Dr. Lucia Gaskins for his opinion- she has a copy of MRI to show him. Ultimately, she knew this before visit but wanted the comfort of seeing someone and being established with PCP- she knows she will need to wait until her visit with [facility] before more answers but may need to  return to care sooner if new or worsening symptoms. We also discussed option of getting local opinion but she prefers to see [facility].

## 2016-05-03 NOTE — Telephone Encounter (Signed)
Pt being seen in our office today

## 2016-05-03 NOTE — Progress Notes (Signed)
Subjective:  [name] is a 56 y.o. year old very pleasant female patient who presents for/with See problem oriented charting. Also presents to establish care.  ROS- no fever, chills, blurry vision, headache other than earache   Past Medical History-  Patient Active Problem List   Diagnosis Date Noted  . Aortic root aneurysm ([place]) 05/03/2016    Priority: High  . Breast cancer of lower-outer quadrant of right female breast ([place]) 08/23/2014    Priority: High  . Tinnitus 01/10/2010    Priority: High  . Elevated cholesterol 11/28/2010    Priority: Medium  . Depression with anxiety 11/28/2010    Priority: Medium  . HEARING LOSS, BILATERAL 01/10/2010    Priority: Medium  . Dyspnea 12/14/2014    Priority: Low  . Seasonal and perennial allergic rhinitis 12/14/2014    Priority: Low  . Genetic testing 12/08/2014    Priority: Low  . Loss of transverse plantar arch 07/16/2014    Priority: Low  . Hereditary and idiopathic peripheral neuropathy 01/10/2010    Priority: Low  . Vitamin B12 deficiency 01/09/2010    Priority: Low  . Fatigue 07/14/2008    Priority: Low  . History of skin cancer 05/26/2007    Priority: Low    Medications- reviewed and updated Current Outpatient Prescriptions  Medication Sig Dispense Refill  . Ascorbic Acid (VITAMIN C) 1000 MG tablet Take 1,000 mg by mouth daily.    Marland Kitchen b complex vitamins tablet Take 1 tablet by mouth daily.    . Cholecalciferol (VITAMIN D) 2000 UNITS CAPS Take by mouth daily.    . clonazePAM (KLONOPIN) 0.5 MG tablet TAKE 1 TABLET BY MOUTH TWICE DAILY AS NEEDED ANXIETY 30 tablet 2  . fish oil-omega-3 fatty acids 1000 MG capsule Take 2 g by mouth daily.      . Indole-3-Carbinol POWD 400 mg by Does not apply route daily.    . magnesium 30 MG tablet Take 30 mg by mouth 2 (two) times daily.    . montelukast (SINGULAIR) 10 MG tablet Take 1 tablet (10 mg total) by mouth at bedtime. 30 tablet 11  . Multiple Vitamin (MULTIVITAMIN) tablet  Take 1 tablet by mouth daily.    . Probiotic Product (PROBIOTIC DAILY PO) Take by mouth daily.    Marland Kitchen venlafaxine XR (EFFEXOR-XR) 37.5 MG 24 hr capsule TAKE 1 CAPSULE BY MOUTH ONCE DAILY WITH BREAKFAST 60 capsule 3   Objective: BP 138/88 (BP Location: Left Arm, Patient Position: Sitting, Cuff Size: Normal)   Pulse 79   Temp 98.4 F (36.9 C) (Oral)   Ht 5' 6.5" (1.689 m)   Wt 136 lb 6.4 oz (61.9 kg)   SpO2 96%   BMI 21.69 kg/m  Gen: NAD, resting comfortably TM normal bilaterally, no pain with opening and closing of jaw CV: RRR no murmurs rubs or gallops Lungs: CTAB no crackles, wheeze, rhonchi  Ext: no edema Skin: warm, dry  Assessment/Plan:  Tinnitus Right ear ache Pulsatile tinnitus left ear Tinnitus right ear S: Patient recently seen on December 18th at urgent care [facility] [address] [facility]) for a bothersome chronic earache. She has always had tinnitus in that ear since she had chemotherapy in 2009 for breast cancer but this intensified. Pain rated 5/10 aching. Worse with walking on treadmill. She states feels like "Really bad ear congestion, sinus infection and like needs to pull a tooth". Her pain also in the past has been down in her jaw more- Saw Dentist in February 2016 for  possible TMJ for pain in right jaw- wears mouthguard. She has seen ENT in past- Dr. Cresenciano Lick ENT 2009 after breast cancer. Dr. Lucia Gaskins -[facility] last few years- right ear. Was told needed hearing aids. Hearing aid did not help. She has not returned to see him.   It was also noted at time of her urgent care visit that she had Pulsatile tinnitus left ear where she "Hears heart beat all the time left ear " which was new- as a result she had a brain MRA ordered which showed a vascular loop but this was on the right side. Getting Set up with [place] appointment. Suggested by Dr. Patrecia Pour of IR who performed the read on the MRA. Of note, patient's husband is a PA with neurology and was able to help get this MRA  set up thankfully. She has tried Has tried heat/ice/essential oils. Naprosyn and tylenol make ears ring worse A/P: Tinnitus and ear ache right ear with new vascular loop found on MRI pending [facility] neurosurgery evaluation. We had an extended discussion today about her concerns and questions. Also went through potential treatment options with no clear helpful option being discovered. Did advise her to see ENT Dr. Lucia Gaskins for his opinion- she has a copy of MRI to show him. Ultimately, she knew this before visit but wanted the comfort of seeing someone and being established with PCP- she knows she will need to wait until her visit with [facility] before more answers but may need to return to care sooner if new or worsening symptoms. We also discussed option of getting local opinion but she prefers to see [facility].     we also spent time reviewing her problem list, reasons for current medication, and history.   Meds ordered this encounter  Medications  . b complex vitamins tablet    Sig: Take 1 tablet by mouth daily.  . Multiple Vitamin (MULTIVITAMIN) tablet    Sig: Take 1 tablet by mouth daily.   The duration of face-to-face time during this visit was greater than 25 minutes. Greater than 50% of this time was spent in counseling about stressors of dealing with these chronic issues, counseling about potential treatments.   Return precautions advised.  Garret Reddish, MD

## 2016-05-03 NOTE — Progress Notes (Signed)
Pre visit review using our clinic review tool, if applicable. No additional management support is needed unless otherwise documented below in the visit note. 

## 2016-05-08 ENCOUNTER — Telehealth: Payer: Self-pay

## 2016-05-08 ENCOUNTER — Encounter: Payer: Self-pay | Admitting: Family Medicine

## 2016-05-08 NOTE — Telephone Encounter (Signed)
Called and left patient a voicemail message asking for a return phone call. Pt last had a Pap Smear in 2012. Dr. Yong Channel would like pt to have a Pap Smear done this year with her Gyn.

## 2016-05-11 DIAGNOSIS — G501 Atypical facial pain: Secondary | ICD-10-CM | POA: Diagnosis not present

## 2016-05-15 ENCOUNTER — Ambulatory Visit (INDEPENDENT_AMBULATORY_CARE_PROVIDER_SITE_OTHER): Payer: 59 | Admitting: Neurology

## 2016-05-15 ENCOUNTER — Encounter: Payer: Self-pay | Admitting: Neurology

## 2016-05-15 VITALS — BP 124/78 | HR 71 | Ht 66.5 in | Wt 136.0 lb

## 2016-05-15 DIAGNOSIS — H905 Unspecified sensorineural hearing loss: Secondary | ICD-10-CM

## 2016-05-15 DIAGNOSIS — H9041 Sensorineural hearing loss, unilateral, right ear, with unrestricted hearing on the contralateral side: Secondary | ICD-10-CM | POA: Diagnosis not present

## 2016-05-15 DIAGNOSIS — H9311 Tinnitus, right ear: Secondary | ICD-10-CM | POA: Diagnosis not present

## 2016-05-15 DIAGNOSIS — I7121 Aneurysm of the ascending aorta, without rupture: Secondary | ICD-10-CM | POA: Insufficient documentation

## 2016-05-15 DIAGNOSIS — I712 Thoracic aortic aneurysm, without rupture: Secondary | ICD-10-CM | POA: Diagnosis not present

## 2016-05-15 DIAGNOSIS — R9089 Other abnormal findings on diagnostic imaging of central nervous system: Secondary | ICD-10-CM

## 2016-05-15 MED ORDER — PREGABALIN 25 MG PO CAPS: 25.0000 mg | ORAL_CAPSULE | Freq: Three times a day (TID) | ORAL | 5 refills | Status: DC

## 2016-05-15 MED FILL — LYRICA 25 MG CAPSULE: 25 | 30 days supply | Qty: 90 | Fill #0

## 2016-05-15 NOTE — Progress Notes (Signed)
PATIENT: [name]: [birth year]  Chief Complaint  Patient presents with  . Facial Pain    New Patient Appt.  She is here with her husband, [name], [name].  Reports initial onset of symptoms in August 2017.  She started with right-sided ear fullness that progressed to right-sided facial pain.  She now has fullness in her left ear and also mild pain on left side of face.  She has underwent MRA and MRI scans.  She has been evaluated at [facility].  She has not tried any medications for her symptoms.  Marland Kitchen PCP    Mary Olp, MD     HISTORICAL  [name] is a 56 years old right-handed female, seen in refer by her primary care [name], for evaluation of right facial discomfort, initial evaluation was May 15 2016, she is accompanied by her husband [name].   She had past medical history of right breast cancer, status post right lumpectomy followed by chemotherapy and radiation therapy.  During the chemotherapy, she has developed left ear pulsatile tinnitus, which has been persistent since then, there was also right ear high pitched tinnitus, gradually getting worse over the years, she was previously evaluated by ENT Dr.Krauser, had MRI of the brain with no significant abnormality found.  Over the years, her tinnitus gradually getting worse, especially the right ear, she also developed right ear pressure sensation, around 2014, she was reevaluated by ENT Dr. Lucia Gaskins, audiology testing showed mild right side sensory neuronal hearing loss, she has tried hearing aid, but could not tolerate it.  She has constant pressure sensation at the right ear, spreading to right temporal, right cheek area, deep pressure achy pain, gradually getting worse, in December 2017, she has such discomfort, presented to the urgent care, since then, she also noticed similar but milder degree involvement in the left ear, she reported constant 4 of 10 right ear pain as she woke up, progressive  as day goes by, at evening time, it can go up to 8 out of 10, she has difficulty sleeping sometimes. She has difficulty sleeping sometimes.  She denies chewing difficulty, denies TMJ joints pain,  She develop peripheral neuropathy during chemotherapy in 2008, at that time she has tried Neurontin, Lyrica, Cymbalta, could not tolerate it due to side effects such as headache, dizziness.  Over the years, for her right facial pain, tinnitus, she has tried thermotherapy, chiropractor, massage, magnesium cream, pressure point, essential oil, holistic remedy without significant improvement, she has tried over-the-counter ibuprofen, Aleve, Tylenol without helping, she has tried Vicodin as needed, which has helped her symptoms.   We have personally reviewed MRI of the brain with without contrast in December 2017 there was no significant abnormality noticed.  MRA of the brain showed only usual prominent and asymmetric loop of the right anterior inferior cerebellar artery projects further into the right internal auditory canal,  MRA of the chest showed stable mild fusiform aneurysm dilation of the ascending thoracic aorta with a maximum diameter of 3.924.0 centimeter,  Laboratory evaluation in July 2017, normal CBC, CMP  REVIEW OF SYSTEMS: Full 14 system review of systems performed and notable only for Above  ALLERGIES: Allergies  Allergen Reactions  . Bee Venom Anaphylaxis  . Polysorbate Anaphylaxis    "Polysorbate 80 preservative"  . Iohexol      Desc: Pt states that she developed itching after receiving Magnevist-MRI contrast.   . Methylprednisolone     HOME MEDICATIONS: Current Outpatient Prescriptions  Medication Sig Dispense Refill  . Ascorbic Acid (VITAMIN C) 1000 MG tablet Take 1,000 mg by mouth daily.    Marland Kitchen b complex vitamins tablet Take 1 tablet by mouth daily.    . Cholecalciferol (VITAMIN D) 2000 UNITS CAPS Take by mouth daily.    . clonazePAM (KLONOPIN) 0.5 MG tablet TAKE 1  TABLET BY MOUTH TWICE DAILY AS NEEDED ANXIETY 30 tablet 2  . fish oil-omega-3 fatty acids 1000 MG capsule Take 2 g by mouth daily.      . Indole-3-Carbinol POWD 400 mg by Does not apply route daily.    . magnesium 30 MG tablet Take 30 mg by mouth 2 (two) times daily.    . montelukast (SINGULAIR) 10 MG tablet Take 1 tablet (10 mg total) by mouth at bedtime. 30 tablet 11  . Multiple Vitamin (MULTIVITAMIN) tablet Take 1 tablet by mouth daily.    . Probiotic Product (PROBIOTIC DAILY PO) Take by mouth daily.    Marland Kitchen venlafaxine XR (EFFEXOR-XR) 37.5 MG 24 hr capsule TAKE 1 CAPSULE BY MOUTH ONCE DAILY WITH BREAKFAST 60 capsule 3   No current facility-administered medications for this visit.     PAST MEDICAL HISTORY: Past Medical History:  Diagnosis Date  . Adhesive capsulitis of shoulder   . Aortic aneurysm ([place])   . Cancer [facility]) 2008   Breast  . Congenital anomaly of aortic arch   . Labyrinthitis, unspecified   . Malignant neoplasm of breast (female), unspecified site   . Other B-complex deficiencies   . Other malaise and fatigue   . Unspecified hearing loss   .  peripheral neuropathy   . Unspecified tinnitus     PAST SURGICAL HISTORY: Past Surgical History:  Procedure Laterality Date  . ABDOMINAL HYSTERECTOMY  04/2010   robotic  . BASAL CELL CARCINOMA EXCISION  2003   rt eye area  . BREAST FIBROADENOMA SURGERY  1981   Left  . BREAST LUMPECTOMY  Oct.29 & Mar 19, 2007   Right  . IR GENERIC HISTORICAL  04/16/2016   IR RADIOLOGIST EVAL & MGMT 04/16/2016 [facility]-INTERV RAD  . LAPAROSCOPY  1982   for endometriosis  . wisdom teeth extracted      FAMILY HISTORY: Family History  Problem Relation Age of Onset  . Seizures Mother   . Migraines Mother   . Hypertension Father   . Other Father   . Hyperlipidemia Father   . Cancer Sister     breast  . Cancer Other     breast    SOCIAL HISTORY:  Social History   Social History  . Marital status: Married    Spouse name: N/A  .  Number of children: 0  . Years of education: 12   Occupational History  . 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 Smoking status: Never Smoker  . Smokeless tobacco: Never Used  . Alcohol use Yes     Comment: wine with drinner a few nights a week  . Drug use: No  . Sexual activity: Not on file   Other Topics Concern  . Not on file   Social History Narrative   HSG, many types of education no formal degree. Married '85, no children. 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 Strong interest in herbology and alternative healing.    Right-handed   1-2 cups coffee per day.  PHYSICAL EXAM   Vitals:   05/15/16 0756  Weight: 136 lb (61.7 kg)  Height: 5' 6.5" (1.689 m)    Not recorded      Body mass index is 21.62 kg/m.  PHYSICAL EXAMNIATION:  Gen: NAD, conversant, well nourised, obese, well groomed                     Cardiovascular: Regular rate rhythm, no peripheral edema, warm, nontender. Eyes: Conjunctivae clear without exudates or hemorrhage Neck: Supple, no carotid bruits. Pulmonary: Clear to auscultation bilaterally   NEUROLOGICAL EXAM:  MENTAL STATUS: Speech:    Speech is normal; fluent and spontaneous with normal comprehension.  Cognition:     Orientation to time, place and person     Normal recent and remote memory     Normal Attention span and concentration     Normal Language, naming, repeating,spontaneous speech     Fund of knowledge   CRANIAL NERVES: CN II: Visual fields are full to confrontation. Fundoscopic exam is normal with sharp discs and no vascular changes. Pupils are round equal and briskly reactive to light. CN III, IV, VI: extraocular movement are normal. No ptosis. CN V: Facial sensation is intact to pinprick in all 3 divisions bilaterally. Corneal responses are intact.  CN VII: Face is symmetric with normal eye  closure and smile. CN VIII: Hearing is normal to rubbing fingers CN IX, X: Palate elevates symmetrically. Phonation is normal. CN XI: Head turning and shoulder shrug are intact CN XII: Tongue is midline with normal movements and no atrophy.  MOTOR: There is no pronator drift of out-stretched arms. Muscle bulk and tone are normal. Muscle strength is normal.  REFLEXES: Reflexes are 2+ and symmetric at the biceps, triceps, knees, and ankles. Plantar responses are flexor.  SENSORY: Intact to light touch, pinprick, positional sensation and vibratory sensation are intact in fingers and toes.  COORDINATION: Rapid alternating movements and fine finger movements are intact. There is no dysmetria on finger-to-nose and heel-knee-shin.    GAIT/STANCE: Posture is normal. Gait is steady with normal steps, base, arm swing, and turning. Heel and toe walking are normal. Tandem gait is normal.  Romberg is absent.   DIAGNOSTIC DATA (LABS, IMAGING, TESTING) - I reviewed patient records, labs, notes, testing and imaging myself where available.   ASSESSMENT AND PLAN  AHNYLA DARIEN is a 56 y.o. female   Right atypical facial pain History of thoracic aorta aneurysm  Complete evaluation with CT angiogram of chest, and CT angiogram of neck to rule out right internal carotid artery dissection  Try low dose Lyrica 25 mg titrating to 1 tablet 3 times a day   Marcial Pacas, M.D. Ph.D.  [facility] [address], [place], [place] [address] Ph: [phone] Fax: [phone]  PA:[id number] Kristian Covey, MD

## 2016-05-16 LAB — SEDIMENTATION RATE: Sed Rate: 2 mm/hr (ref 0–40)

## 2016-05-16 LAB — C-REACTIVE PROTEIN: CRP: 0.3 mg/L (ref 0.0–4.9)

## 2016-05-16 LAB — ANA W/REFLEX: Anti Nuclear Antibody(ANA): NEGATIVE

## 2016-05-16 LAB — TSH: TSH: 2.38 u[IU]/mL (ref 0.450–4.500)

## 2016-05-16 LAB — VITAMIN B12: VITAMIN B 12: 363 pg/mL (ref 232–1245)

## 2016-05-16 LAB — VITAMIN D 25 HYDROXY (VIT D DEFICIENCY, FRACTURES): Vit D, 25-Hydroxy: 34.3 ng/mL (ref 30.0–100.0)

## 2016-05-18 ENCOUNTER — Ambulatory Visit (HOSPITAL_COMMUNITY)
Admission: RE | Admit: 2016-05-18 | Discharge: 2016-05-18 | Disposition: A | Discharge status: Home / Self Care | Payer: 59 | Source: Ambulatory Visit | Attending: Neurology | Admitting: Neurology

## 2016-05-18 ENCOUNTER — Encounter (HOSPITAL_COMMUNITY): Payer: Self-pay

## 2016-05-18 DIAGNOSIS — H9311 Tinnitus, right ear: Secondary | ICD-10-CM | POA: Insufficient documentation

## 2016-05-18 DIAGNOSIS — I712 Thoracic aortic aneurysm, without rupture: Secondary | ICD-10-CM | POA: Diagnosis not present

## 2016-05-18 DIAGNOSIS — R9089 Other abnormal findings on diagnostic imaging of central nervous system: Secondary | ICD-10-CM | POA: Diagnosis not present

## 2016-05-18 DIAGNOSIS — I7121 Aneurysm of the ascending aorta, without rupture: Secondary | ICD-10-CM

## 2016-05-18 DIAGNOSIS — H9041 Sensorineural hearing loss, unilateral, right ear, with unrestricted hearing on the contralateral side: Secondary | ICD-10-CM | POA: Diagnosis not present

## 2016-05-18 DIAGNOSIS — H905 Unspecified sensorineural hearing loss: Secondary | ICD-10-CM

## 2016-05-18 MED ORDER — IOPAMIDOL (ISOVUE-370) INJECTION 76%
INTRAVENOUS | Status: AC
  Administered 2016-05-18: 80 mL
  Filled 2016-05-18: qty 100

## 2016-05-21 ENCOUNTER — Telehealth: Payer: Self-pay | Admitting: Neurology

## 2016-05-21 NOTE — Telephone Encounter (Signed)
I called patient in the absence of Dr. Krista Blue.  CT angiogram of the neck was unremarkable, CT angiogram of the chest showed a 3.8 cm ascending thoracic aorta aneurysm, this appears to be stable.  CTA head 05/18/16:  IMPRESSION: 1. Unremarkable appearance of the cervical carotid and vertebral arteries. 2. See separate chest CTA report for assessment of the aorta.   CTA of the chest 05/18/16:  IMPRESSION: 1. Stable fusiform aneurysmal dilatation of the ascending thoracic aorta, again measuring 3.8 cm diameter. Stable appearance of the previously described ductus diverticulum. Remainder of the thoracic aorta remains normal in caliber and configuration. 2. No acute findings.

## 2016-06-07 ENCOUNTER — Other Ambulatory Visit: Payer: Self-pay | Admitting: *Deleted

## 2016-06-07 ENCOUNTER — Other Ambulatory Visit: Payer: Self-pay | Admitting: Oncology

## 2016-06-07 MED FILL — clonazePAM 0.5 MG TABS: 0.5 | 15 days supply | Qty: 30 | Fill #1

## 2016-06-07 MED FILL — VENLAFAXINE HCL ER 37.5 MG: 37.5 | 60 days supply | Qty: 60 | Fill #0

## 2016-07-02 DIAGNOSIS — Z6821 Body mass index (BMI) 21.0-21.9, adult: Secondary | ICD-10-CM | POA: Diagnosis not present

## 2016-07-02 DIAGNOSIS — Z853 Personal history of malignant neoplasm of breast: Secondary | ICD-10-CM | POA: Diagnosis not present

## 2016-07-02 DIAGNOSIS — Z1272 Encounter for screening for malignant neoplasm of vagina: Secondary | ICD-10-CM | POA: Diagnosis not present

## 2016-07-02 DIAGNOSIS — Z01411 Encounter for gynecological examination (general) (routine) with abnormal findings: Secondary | ICD-10-CM | POA: Diagnosis not present

## 2016-07-02 DIAGNOSIS — R300 Dysuria: Secondary | ICD-10-CM | POA: Diagnosis not present

## 2016-07-02 DIAGNOSIS — Z0000 Encounter for general adult medical examination without abnormal findings: Secondary | ICD-10-CM | POA: Diagnosis not present

## 2016-07-02 LAB — HM DEXA SCAN: HM Dexa Scan: NORMAL

## 2016-07-02 LAB — HM HEPATITIS C SCREENING LAB: HM HEPATITIS C SCREENING: NEGATIVE

## 2016-07-02 LAB — HM PAP SMEAR: HM Pap smear: NEGATIVE

## 2016-07-30 ENCOUNTER — Ambulatory Visit: Payer: 59 | Admitting: Neurology

## 2016-08-01 MED FILL — clonazePAM 0.5 MG TABS: 0.5 | 15 days supply | Qty: 30 | Fill #2

## 2016-09-04 ENCOUNTER — Telehealth: Payer: 59 | Admitting: Family

## 2016-09-04 DIAGNOSIS — W57XXXA Bitten or stung by nonvenomous insect and other nonvenomous arthropods, initial encounter: Secondary | ICD-10-CM | POA: Diagnosis not present

## 2016-09-04 DIAGNOSIS — S30861A Insect bite (nonvenomous) of abdominal wall, initial encounter: Secondary | ICD-10-CM

## 2016-09-04 MED ORDER — DOXYCYCLINE HYCLATE 100 MG PO TABS: 100.0000 mg | ORAL_TABLET | Freq: Two times a day (BID) | ORAL | 0 refills | Status: DC

## 2016-09-04 MED FILL — DOXYCYCLINE HYC 100 MG TAB: 100 | 10 days supply | Qty: 20 | Fill #0

## 2016-09-04 NOTE — Progress Notes (Signed)
Thank you for describing your tick bite, Here is how we plan to help! Based on the information that you shared with me it looks like you have A tick that bite that we will treat with a short course of doxycycline.  In most cases a tick bite is painless and does not itch.  Most tick bites in which the tick is quickly removed do not require prescriptions. Ticks can transmit several diseases if they are infected and remain attacked to your skin. Therefore the length that the tick was attached and any symptoms you have experienced after the bite are import to accurately develop your custom treatment plan. In most cases a single dose of doxycycline may prevent the development of a more serious condition.  Based on your information I have Provided a home care guide for tick bites  and  instructions on when to call for help. and I have sent a single dose of doxycycline to the pharmacy you selected. Please make sure that you selected a pharmacy that is open now.  Which ticks  are associated with illness?  The Wood Tick (dog tick) is the size of a watermelon seed and can sometimes transmit Rocky Mountain spotted fever and [place] tick fever.   The Deer Tick (black-legged tick) is between the size of a poppy seed (pin head) and an apple seed, and can sometimes transmit Lyme disease.  A brown to black tick with a white splotch on its back is likely a female Amblyomma americanum ([place] tick). This tick has been associated with Southern Tick Associated illness ( STARI)  Lyme disease has become the most common tick-borne illness in the [place]. The risk of Lyme disease following a recognized deer tick bite is estimated to be 1%.  The majority of cases of Lyme disease start with a bull's eye rash at the site of the tick bite. The rash can occur days to weeks (typically 7-10 days) after a tick bite. Treatment with antibiotics is indicated if this rash appears. Flu-like symptoms may accompany the rash,  including: fever, chills, headaches, muscle aches, and fatigue. Removing ticks promptly may prevent tick borne disease.  What can be used to prevent Tick Bites?   Insect repellant with at leas 20% DEET.  Wearing long pants with sock and shoes.  Avoiding tall grass and heavily wooded areas.  Checking your skin after being outdoors.  Shower with a washcloth after outdoor exposures.  HOME CARE ADVICE FOR TICK BITE  1. Wood Tick Removal:  o Use a pair of tweezers and grasp the wood tick close to the skin (on its head). Pull the wood tick straight upward without twisting or crushing it. Maintain a steady pressure until it releases its grip.   o If tweezers aren't available, use fingers, a loop of thread around the jaws, or a needle between the jaws for traction.  o Note: covering the tick with petroleum jelly, nail polish or rubbing alcohol doesn't work. Neither does touching the tick with a hot or cold object. 2. Tiny Deer Tick Removal:   o Needs to be scraped off with a knife blade or credit card edge. o Place tick in a sealed container (e.g. glass jar, zip lock plastic bag), in case your doctor wants to see it. 3. Tick's Head Removal:  o If the wood tick's head breaks off in the skin, it must be removed. Clean the skin. Then use a sterile needle to uncover the head and lift it out   or scrape it off.  o If a very small piece of the head remains, the skin will eventually slough it off. 4. Antibiotic Ointment:  o Wash the wound and your hands with soap and water after removal to prevent catching any tick disease.  Apply an over the counter antibiotic ointment (e.g. bacitracin) to the bite once. 5. Expected Course: Tick bites normally don't itch or hurt. That's why they often go unnoticed. 6. Call Your Doctor If:  o You can't remove the tick or the tick's head o Fever, a severe head ache, or rash occur in the next 2 weeks o Bite begins to look infected o Lyme's disease is common in your  area o You have not had a tetanus in the last 10 years o Your current symptoms become worse    MAKE SURE YOU   Understand these instructions.  Will watch your condition.  Will get help right away if you are not doing well or get worse.   Thank you for choosing an e-visit.  Your e-visit answers were reviewed by a board certified advanced clinical practitioner to complete your personal care plan. Depending upon the condition, your plan could have included both over the counter or prescription medications. Please review your pharmacy choice. If there is a problem you may use MyChart messaging to have the prescription routed to another pharmacy. Your safety is important to us. If you have drug allergies check your prescription carefully.   You can use MyChart to ask questions about today's visit, request a non-urgent call back, or ask for a work or school excuse for 24 hours related to this e-Visit. If it has been greater than 24 hours you will need to follow up with your provider, or enter a new e-Visit to address those concerns.  You will get an email in the next two days asking about your experience. I hope  that your e-visit has been valuable and will speed your recovery 

## 2016-09-04 NOTE — Addendum Note (Signed)
Addended by: Dutch Quint B on: 09/04/2016 01:14 PM   Modules accepted: Orders

## 2016-09-04 NOTE — Progress Notes (Signed)
Thank you for describing your tick bite, Here is how we plan to help! Based on the information that you shared with me it looks like you have An uncomplicated tick bite that just occurred and can be closely follow using the instructions in your care plan.  In most cases a tick bite is painless and does not itch.  Most tick bites in which the tick is quickly removed do not require prescriptions. Ticks can transmit several diseases if they are infected and remain attacked to your skin. Therefore the length that the tick was attached and any symptoms you have experienced after the bite are import to accurately develop your custom treatment plan. In most cases a single dose of doxycycline may prevent the development of a more serious condition.  Based on your information I have Provided a home care guide for tick bites  and  instructions on when to call for help. and Because you are allergic to doxycycline, the center for disease control recommends that we monitor you closely for the next two weeks. No other antibiotic has been approved for prophylaxis.  Which ticks  are associated with illness?  The Wood Tick (dog tick) is the size of a watermelon seed and can sometimes transmit [facility] spotted fever and [place] tick fever.   The Deer Tick (black-legged tick) is between the size of a poppy seed (pin head) and an apple seed, and can sometimes transmit Lyme disease.  A brown to black tick with a white splotch on its back is likely a female Amblyomma americanum ([place] tick). This tick has been associated with Southern Tick Associated illness ( STARI)  Lyme disease has become the most common tick-borne illness in the [place]. The risk of Lyme disease following a recognized deer tick bite is estimated to be 1%.  The majority of cases of Lyme disease start with a bull's eye rash at the site of the tick bite. The rash can occur days to weeks (typically 7-10 days) after a tick bite.  Treatment with antibiotics is indicated if this rash appears. Flu-like symptoms may accompany the rash, including: fever, chills, headaches, muscle aches, and fatigue. Removing ticks promptly may prevent tick borne disease.  What can be used to prevent Tick Bites?   Insect repellant with at leas 20% DEET.  Wearing long pants with sock and shoes.  Avoiding tall grass and heavily wooded areas.  Checking your skin after being outdoors.  Shower with a washcloth after outdoor exposures.  HOME CARE ADVICE FOR TICK BITE  1. Wood Tick Removal:  o Use a pair of tweezers and grasp the wood tick close to the skin (on its head). Pull the wood tick straight upward without twisting or crushing it. Maintain a steady pressure until it releases its grip.   o If tweezers aren't available, use fingers, a loop of thread around the jaws, or a needle between the jaws for traction.  o Note: covering the tick with petroleum jelly, nail polish or rubbing alcohol doesn't work. Neither does touching the tick with a hot or cold object. 2. Tiny Deer Tick Removal:   o Needs to be scraped off with a knife blade or credit card edge. o Place tick in a sealed container (e.g. glass jar, zip lock plastic bag), in case your doctor wants to see it. 3. Tick's Head Removal:  o If the wood tick's head breaks off in the skin, it must be removed. Clean the skin. Then use a  sterile needle to uncover the head and lift it out or scrape it off.  o If a very small piece of the head remains, the skin will eventually slough it off. 4. Antibiotic Ointment:  o Wash the wound and your hands with soap and water after removal to prevent catching any tick disease.  Apply an over the counter antibiotic ointment (e.g. bacitracin) to the bite once. 5. Expected Course: Tick bites normally don't itch or hurt. That's why they often go unnoticed. 6. Call Your Doctor If:  o You can't remove the tick or the tick's head o Fever, a severe head ache,  or rash occur in the next 2 weeks o Bite begins to look infected o Lyme's disease is common in your area o You have not had a tetanus in the last 10 years o Your current symptoms become worse    MAKE SURE YOU   Understand these instructions.  Will watch your condition.  Will get help right away if you are not doing well or get worse.   Thank you for choosing an e-visit.  Your e-visit answers were reviewed by a board certified advanced clinical practitioner to complete your personal care plan. Depending upon the condition, your plan could have included both over the counter or prescription medications. Please review your pharmacy choice. If there is a problem you may use MyChart messaging to have the prescription routed to another pharmacy. Your safety is important to [place]. If you have drug allergies check your prescription carefully.   You can use MyChart to ask questions about today's visit, request a non-urgent call back, or ask for a work or school excuse for 24 hours related to this e-Visit. If it has been greater than 24 hours you will need to follow up with your provider, or enter a new e-Visit to address those concerns.  You will get an email in the next two days asking about your experience. I hope  that your e-visit has been valuable and will speed your recovery

## 2016-09-06 ENCOUNTER — Encounter: Payer: Self-pay | Admitting: Family Medicine

## 2016-09-07 ENCOUNTER — Ambulatory Visit (INDEPENDENT_AMBULATORY_CARE_PROVIDER_SITE_OTHER): Payer: 59 | Admitting: Family Medicine

## 2016-09-07 ENCOUNTER — Encounter: Payer: Self-pay | Admitting: Family Medicine

## 2016-09-07 VITALS — BP 132/82 | HR 62 | Temp 98.1°F | Ht 66.5 in | Wt 134.0 lb

## 2016-09-07 DIAGNOSIS — S30861A Insect bite (nonvenomous) of abdominal wall, initial encounter: Secondary | ICD-10-CM

## 2016-09-07 DIAGNOSIS — W57XXXA Bitten or stung by nonvenomous insect and other nonvenomous arthropods, initial encounter: Secondary | ICD-10-CM | POA: Diagnosis not present

## 2016-09-07 DIAGNOSIS — R21 Rash and other nonspecific skin eruption: Secondary | ICD-10-CM | POA: Diagnosis not present

## 2016-09-07 NOTE — Patient Instructions (Signed)
Based on prior pictures - appears this may have been a skin infection that is now improving (your body fought it off)  I do not think this represents lyme disease given no systemic symptoms. Below are things to look out for - you still have the doxycycline available if have systemic signs or if signs of local infection reoccur   We discussed prophylactic option - would be 200mg  (2 of the doxycyline pills) but you do not meet criteria at present. Since you are around so many ticks printed these criteria for you and your husband.     Lyme Disease Lyme disease is an infection that affects many parts of the body, including the skin, joints, and nervous system. It is a bacterial infection that starts from the bite of an infected tick. The infection can spread, and some of the symptoms are similar to the flu. If Lyme disease is not treated, it may cause joint pain, swelling, numbness, problems thinking, fatigue, muscle weakness, and other problems. What are the causes? This condition is caused by bacteria called Borrelia burgdorferi. You can get Lyme disease by being bitten by an infected tick. The tick must be attached to your skin to pass along the infection. Deer often carry infected ticks. What increases the risk? The following factors may make you more likely to develop this condition:  Living in or visiting these areas in the [place]  [place].  The [place].  The [place].  Spending time in wooded or grassy areas.  Being outdoors with exposed skin.  Camping, gardening, hiking, fishing, or hunting outdoors.  Failing to remove a tick from your skin within 3-4 days. What are the signs or symptoms? Symptoms of this condition include:  A round, red rash that surrounds the center of the tick bite. This is the first sign of infection. The center of the rash may be blood colored or have tiny blisters.  Fatigue.  Headache.  Chills and fever.  General  achiness.  Joint pain, often in the knees.  Muscle pain.  Swollen lymph glands.  Stiff neck. How is this diagnosed? This condition is diagnosed based on:  Your symptoms and medical history.  A physical exam.  A blood test. How is this treated? The main treatment for this condition is antibiotic medicine, which is usually taken by mouth (orally). The length of treatment depends on how soon after a tick bite you begin taking the medicine. In some cases, treatment is necessary for several weeks. If the infection is severe, antibiotics may need to be given through an IV tube that is inserted into one of your veins. Follow these instructions at home:  Take your antibiotic medicine as told by your health care provider. Do not stop taking the antibiotic even if you start to feel better.  Ask your health care provider about takinga probiotic in between doses of your antibiotic to help avoid stomach upset or diarrhea.  Check with your health care provider before supplementing your treatment. Many alternative therapies have not been proven and may be harmful to you.  Keep all follow-up visits as told by your health care provider. This is important. How is this prevented? You can become reinfected if you get another tick bite from an infected tick. Take these steps to help prevent an infection:  Cover your skin with light-colored clothing when you are outdoors in the spring and summer months.  Spray clothing and skin with bug spray. The spray should be 20-30% DEET.  Avoid wooded, grassy, and shaded areas.  Remove yard litter, brush, trash, and plants that attract deer and rodents.  Check yourself for ticks when you come indoors.  Wash clothing worn each day.  Check your pets for ticks before they come inside.  If you find a tick:  Remove it with tweezers.  Clean your hands and the bite area with rubbing alcohol or soap and water. Pregnant women should take special care to  avoid tick bites because the infection can be passed along to the fetus. Contact a health care provider if:  You have symptoms after treatment.  You have removed a tick and want to bring it to your health care provider for testing. Get help right away if:  You have an irregular heartbeat.  You have nerve pain.  Your face feels numb. This information is not intended to replace advice given to you by your health care provider. Make sure you discuss any questions you have with your health care provider. Document Released: 07/23/2000 Document Revised: 12/06/2015 Document Reviewed: 12/06/2015 Elsevier Interactive Patient Education  2017 [employer].

## 2016-09-07 NOTE — Progress Notes (Signed)
Subjective:  [name] is a 56 y.o. year old very pleasant female patient who presents for/with See problem oriented charting ROS- no fever, chills, nausea, vomiting, joint aches or swelling, headache, fatigue, sore throat  Past Medical History-  Patient Active Problem List   Diagnosis Date Noted  . Aortic root aneurysm ([place]) 05/03/2016    Priority: High  . Breast cancer of lower-outer quadrant of right female breast ([place]) 08/23/2014    Priority: High  . Tinnitus 01/10/2010    Priority: High  . Elevated cholesterol 11/28/2010    Priority: Medium  . Depression with anxiety 11/28/2010    Priority: Medium  . HEARING LOSS, BILATERAL 01/10/2010    Priority: Medium  . Dyspnea 12/14/2014    Priority: Low  . Seasonal and perennial allergic rhinitis 12/14/2014    Priority: Low  . Genetic testing 12/08/2014    Priority: Low  . Loss of transverse plantar arch 07/16/2014    Priority: Low  . Hereditary and idiopathic peripheral neuropathy 01/10/2010    Priority: Low  . Vitamin B12 deficiency 01/09/2010    Priority: Low  . Fatigue 07/14/2008    Priority: Low  . History of skin cancer 05/26/2007    Priority: Low  . Ascending aortic aneurysm ([place]) 05/15/2016  . Abnormal MRA, brain 05/15/2016  . Sensorineural hearing loss (SNHL) of right ear 05/15/2016    Medications- reviewed and updated Current Outpatient Prescriptions  Medication Sig Dispense Refill  . Ascorbic Acid (VITAMIN C) 1000 MG tablet Take 1,000 mg by mouth daily.    Marland Kitchen b complex vitamins tablet Take 1 tablet by mouth daily.    . Cholecalciferol (VITAMIN D) 2000 UNITS CAPS Take by mouth daily.    . clonazePAM (KLONOPIN) 0.5 MG tablet TAKE 1 TABLET BY MOUTH TWICE DAILY AS NEEDED ANXIETY 30 tablet 2  . doxycycline (VIBRA-TABS) 100 MG tablet Take 1 tablet (100 mg total) by mouth 2 (two) times daily. 20 tablet 0  . montelukast (SINGULAIR) 10 MG tablet Take 1 tablet (10 mg total) by mouth at bedtime. 30 tablet 11  .  Multiple Vitamin (MULTIVITAMIN) tablet Take 1 tablet by mouth daily.    . Probiotic Product (PROBIOTIC DAILY PO) Take by mouth daily.    Marland Kitchen venlafaxine (EFFEXOR) 37.5 MG tablet TAKE 1 TABLET BY MOUTH TWICE A DAY WITH A MEAL (Patient taking differently: TAKE 1 TABLET BY MOUTH once A DAY WITH A MEAL) 60 tablet 3   No current facility-administered medications for this visit.     Objective: BP 132/82   Pulse 62   Temp 98.1 F (36.7 C) (Oral)   Ht 5' 6.5" (1.689 m)   Wt 134 lb (60.8 kg)   SpO2 98%   BMI 21.30 kg/m  Gen: NAD, resting comfortably Mucous membranes are moist. Nares and pharynx normal.  CV: RRR no murmurs rubs or gallops Lungs: CTAB no crackles, wheeze, rhonchi Abdomen: soft/nontender/nondistended/normal bowel sounds. No rebound or guarding.  On abdomen near umbilicus- several small black scabs without any surrouding erythema (in area of prior rash and erythema) Ext: no edema Skin: warm, dry, no rash on palms or soles  Assessment/Plan:  Rash  Tick bite of abdomen, initial encounter S: Patient had a tick bite on right hip about 3 weeks ago. Then on Monday had a tick bite near umbilicus. Surrounding tissues became erythematous in a least 8 x 8 cm distribution. She has nto felt systemically ill. There are pictures available attached through evisits. She was treated with doxycycline  through e visit with 10 days of doxycycline under tick bite but not clear if this was for concern of lyme, skin infection.   Patient actually was not aware they sent in rx and asked me about sending in rx through mychart- I advised her to come in for visit to evaluate. Today she continues to state no systemic symptoms- no fever, chills, nausea, vomiting, more extensive rash. In fact the area of redness has largely resolved though there is some very small black scabs in area (she states area has been pruritic but declines scratching it) A/P: we discussed read area more likely to have been related to  local irritation and infection than related to tick bite. The area was confluently erythematous and has since resolved. Tick was on short period and was not engorged. We are in an area that is lower risk for lyme disease  As well. She does not even meet criteria for prophylactic treatment though I printed these for her to consider in the future for treatment. She plans to still pick up the doxycycline and will take if has recurrence of the red area or systemic symptoms. Up to date Tdap as of 2009- considered repeating slightly early with bite but held off for now  Return precautions advised.  Garret Reddish, MD

## 2016-09-17 ENCOUNTER — Ambulatory Visit (INDEPENDENT_AMBULATORY_CARE_PROVIDER_SITE_OTHER): Payer: 59 | Admitting: Neurology

## 2016-09-17 ENCOUNTER — Encounter: Payer: Self-pay | Admitting: Neurology

## 2016-09-17 VITALS — BP 132/74 | HR 69 | Ht 66.5 in | Wt 131.5 lb

## 2016-09-17 DIAGNOSIS — M542 Cervicalgia: Secondary | ICD-10-CM | POA: Diagnosis not present

## 2016-09-17 MED FILL — VENLAFAXINE HCL ER 37.5 MG: 37.5 | 60 days supply | Qty: 60 | Fill #1

## 2016-09-17 NOTE — Patient Instructions (Signed)
[name], [place] 5.0 2 [employer] reviews Acupuncture clinic in [place], [place] Address: [address], [place], [place] [address] Phone: [phone]

## 2016-09-17 NOTE — Progress Notes (Signed)
PATIENT: [name] DOB: 26-Feb-1961  Chief Complaint  Patient presents with  . Facial Pain    She is still having intermittent facial pain that tends to worsen with weather changes.  She was unable to tolerable Lyrica due to intolerable nausea, vomiting and dizziness.  She is now using a bite guard at night, along with vitamins and spices.  Marland Kitchen Hx of thoracic aorta aneurysm     HISTORICAL  [name] is a 56 years old right-handed female, seen in refer by her primary care [name], for evaluation of right facial discomfort, initial evaluation was May 15 2016, she is accompanied by her husband [name].   She had past medical history of right breast cancer, status post right lumpectomy followed by chemotherapy and radiation therapy.  During the chemotherapy, she has developed left ear pulsatile tinnitus, which has been persistent since then, there was also right ear high pitched tinnitus, gradually getting worse over the years, she was previously evaluated by ENT [name], had MRI of the brain with no significant abnormality found.  Over the years, her tinnitus gradually getting worse, she also developed right ear pressure sensation, around 2014, she was reevaluated by ENT [name], audiology testing showed mild right side sensory neuronal hearing loss, she has tried hearing aid, but could not tolerate it.  She has constant pressure sensation at the right ear, spreading to right temporal, right cheek area, deep pressure achy pain, gradually getting worse, in December 2017, she has such discomfort, presented to the urgent care, since then, she also noticed similar but milder degree involvement in the left ear, she reported constant 4 of 10 right ear pain as she woke up, progressive as day goes by, at evening time, it can go up to 8 out of 10, she has difficulty sleeping sometimes. She has difficulty sleeping sometimes.  She denies chewing difficulty, denies TMJ  joints pain,  She develop peripheral neuropathy during chemotherapy in 2008, at that time she has tried Neurontin, Lyrica, Cymbalta, could not tolerate it due to side effects such as headache, dizziness.  Over the years, for her right facial pain, tinnitus, she has tried thermotherapy, chiropractor, massage, magnesium cream, pressure point, essential oil, holistic remedy without significant improvement, she has tried over-the-counter ibuprofen, Aleve, Tylenol without helping, she has tried Vicodin as needed, which has helped her symptoms.   We have personally reviewed MRI of the brain with without contrast in December 2017 there was no significant abnormality noticed.  MRA of the brain showed only usual prominent and asymmetric loop of the right anterior inferior cerebellar artery projects further into the right internal auditory canal,  MRA of the chest showed stable mild fusiform aneurysm dilation of the ascending thoracic aorta with a maximum diameter of 3.924.0 centimeter,  Laboratory evaluation in July 2017, normal CBC, CMP  UPDATE Sep 17 2016: I have personally reviewed CT angiogram of neck on May 18 2016, that was unremarkable, CT angiogram of the thoracic showed stable fusiform aneurysm dilatation of the ascending thoracic aorta, measuring 3.8 centimeter, stable appearance,  She could not tolerate Lyrica, even at low dose 39m tid, she complains of dizziness, nauseous, She is now taking tumeric and multiple other spicy pill.   She still wear mouth guard, does massage regularly, she complains of pain at her occipital region intermittently, has tried heating pad, massage therapy,  I reviewed the laboratory evaluations, normal B12, TSH, ESR, C-reactive protein, CMP, CBC, vitamin D, negative ANA,  REVIEW OF SYSTEMS: Full 14 system review of systems performed and notable only for ringing ears, neck pain, environmental allergy Above  ALLERGIES: Allergies  Allergen Reactions  . Bee  Venom Anaphylaxis  . Other Anaphylaxis  . Polysorbate Anaphylaxis    "Polysorbate 80 preservative"  . Latex Hives  . Lyrica [Pregabalin] Nausea And Vomiting and Other (See Comments)    Dizziness   . Methylprednisolone Other (See Comments)    Injection only - has polysorbate 80 preservative  . Doxycycline Hyclate Rash  . Multihance [Gadobenate] Itching    Pt developed itching after receiving Magnavist-MRI contrast    HOME MEDICATIONS: Current Outpatient Prescriptions  Medication Sig Dispense Refill  . Ascorbic Acid (VITAMIN C) 1000 MG tablet Take 1,000 mg by mouth daily.    Marland Kitchen b complex vitamins tablet Take 1 tablet by mouth daily.    . Cholecalciferol (VITAMIN D) 2000 UNITS CAPS Take by mouth daily.    . clonazePAM (KLONOPIN) 0.5 MG tablet TAKE 1 TABLET BY MOUTH TWICE DAILY AS NEEDED ANXIETY 30 tablet 2  . doxycycline (VIBRA-TABS) 100 MG tablet Take 1 tablet (100 mg total) by mouth 2 (two) times daily. 20 tablet 0  . Multiple Vitamin (MULTIVITAMIN) tablet Take 1 tablet by mouth daily.    . Probiotic Product (PROBIOTIC DAILY PO) Take by mouth daily.    Marland Kitchen venlafaxine XR (EFFEXOR-XR) 37.5 MG 24 hr capsule Take 37.5 mg by mouth daily with breakfast.     No current facility-administered medications for this visit.     PAST MEDICAL HISTORY: Past Medical History:  Diagnosis Date  . Adhesive capsulitis of shoulder   . Aortic aneurysm ([place])   . Cancer [facility]) 2008   Breast  . Congenital anomaly of aortic arch   . Labyrinthitis, unspecified   . Malignant neoplasm of breast (female), unspecified site   . Other B-complex deficiencies   . Other malaise and fatigue   . Unspecified hearing loss   .  peripheral neuropathy   . Unspecified tinnitus     PAST SURGICAL HISTORY: Past Surgical History:  Procedure Laterality Date  . ABDOMINAL HYSTERECTOMY  04/2010   robotic  . BASAL CELL CARCINOMA EXCISION  2003   rt eye area  . BREAST FIBROADENOMA SURGERY  1981   Left  . BREAST  LUMPECTOMY  Oct.29 & Mar 19, 2007   Right  . IR GENERIC HISTORICAL  04/16/2016   IR RADIOLOGIST EVAL & MGMT 04/16/2016 [facility]-INTERV RAD  . LAPAROSCOPY  1982   for endometriosis  . wisdom teeth extracted      FAMILY HISTORY: Family History  Problem Relation Age of Onset  . Seizures Mother   . Migraines Mother   . Hypertension Father   . Other Father   . Hyperlipidemia Father   . Cancer Sister        breast  . Cancer Other        breast    SOCIAL HISTORY:  Social History   Social History  . Marital status: Married    Spouse name: N/A  . Number of children: 0  . Years of education: 12   Occupational History  . 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 Smoking status: Never Smoker  . Smokeless tobacco: Never Used  . Alcohol use Yes     Comment: wine with drinner a few nights a week  . Drug use: No  . Sexual activity: Not on  file   Other Topics Concern  . Not on file   Social History Narrative   HSG, many types of education no formal degree. Married '85, no children. 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 Strong interest in herbology and alternative healing.    Right-handed   1-2 cups coffee per day.     PHYSICAL EXAM   Vitals:   09/17/16 0837  BP: 132/74  Pulse: 69  Weight: 131 lb 8 oz (59.6 kg)  Height: 5' 6.5" (1.689 m)    Not recorded      Body mass index is 20.91 kg/m.  PHYSICAL EXAMNIATION:  Gen: NAD, conversant, well nourised, obese, well groomed                     Cardiovascular: Regular rate rhythm, no peripheral edema, warm, nontender. Eyes: Conjunctivae clear without exudates or hemorrhage Neck: Supple, no carotid bruits. Pulmonary: Clear to auscultation bilaterally   NEUROLOGICAL EXAM:  MENTAL STATUS: Speech:    Speech is normal; fluent and spontaneous with normal comprehension.  Cognition:      Orientation to time, place and person     Normal recent and remote memory     Normal Attention span and concentration     Normal Language, naming, repeating,spontaneous speech     Fund of knowledge   CRANIAL NERVES: CN II: Visual fields are full to confrontation. Fundoscopic exam is normal with sharp discs and no vascular changes. Pupils are round equal and briskly reactive to light. CN III, IV, VI: extraocular movement are normal. No ptosis. CN V: Facial sensation is intact to pinprick in all 3 divisions bilaterally. Corneal responses are intact.  CN VII: Face is symmetric with normal eye closure and smile. CN VIII: Hearing is normal to rubbing fingers CN IX, X: Palate elevates symmetrically. Phonation is normal. CN XI: Head turning and shoulder shrug are intact CN XII: Tongue is midline with normal movements and no atrophy.  MOTOR: There is no pronator drift of out-stretched arms. Muscle bulk and tone are normal. Muscle strength is normal.  REFLEXES: Reflexes are 2+ and symmetric at the biceps, triceps, knees, and ankles. Plantar responses are flexor.  SENSORY: Intact to light touch, pinprick, positional sensation and vibratory sensation are intact in fingers and toes.  COORDINATION: Rapid alternating movements and fine finger movements are intact. There is no dysmetria on finger-to-nose and heel-knee-shin.    GAIT/STANCE: Posture is normal. Gait is steady with normal steps, base, arm swing, and turning. Heel and toe walking are normal. Tandem gait is normal.  Romberg is absent.   DIAGNOSTIC DATA (LABS, IMAGING, TESTING) - I reviewed patient records, labs, notes, testing and imaging myself where available.   ASSESSMENT AND PLAN  LOREDANA MEDELLIN is a 56 y.o. female   Right atypical facial pain, neck pain,  Most consistent with musculoskeletal etiology  Hot compression, massage therapy, as needed NSAIDs,  History of thoracic aorta aneurysm  Stable on repeat CT  angiogram in January 2018     Marcial Pacas, M.D. Ph.D.  [facility] [address], [place], [place] [address] Ph: [phone] Fax: [phone]  [id number] Mary Covey, MD

## 2016-09-22 ENCOUNTER — Other Ambulatory Visit: Payer: Self-pay | Admitting: Neurology

## 2016-09-22 MED ORDER — HYDROCODONE-ACETAMINOPHEN 5-325 MG PO TABS: 1.0000 | ORAL_TABLET | Freq: Four times a day (QID) | ORAL | 0 refills | Status: DC | PRN

## 2016-09-25 MED FILL — HYDROCODON-APAP 5-325: 5-325 | 15 days supply | Qty: 60 | Fill #0

## 2016-09-28 ENCOUNTER — Encounter: Payer: Self-pay | Admitting: Neurology

## 2016-09-28 ENCOUNTER — Ambulatory Visit (INDEPENDENT_AMBULATORY_CARE_PROVIDER_SITE_OTHER): Payer: 59 | Admitting: Neurology

## 2016-09-28 DIAGNOSIS — G500 Trigeminal neuralgia: Secondary | ICD-10-CM | POA: Diagnosis not present

## 2016-09-28 DIAGNOSIS — G43109 Migraine with aura, not intractable, without status migrainosus: Secondary | ICD-10-CM | POA: Diagnosis not present

## 2016-09-28 MED ORDER — METHYLPREDNISOLONE 4 MG PO TBPK: ORAL_TABLET | ORAL | 1 refills | Status: DC

## 2016-09-28 MED ORDER — BACLOFEN 10 MG PO TABS: ORAL_TABLET | ORAL | 0 refills | Status: DC

## 2016-09-28 MED FILL — BACLOFEN 10 MG TABLET: 10 | 30 days supply | Qty: 90 | Fill #0

## 2016-09-28 MED FILL — METHYLPREDNISOLONE 4 MG TAB: 4 | 6 days supply | Qty: 21 | Fill #0

## 2016-09-28 NOTE — Patient Instructions (Signed)
Remember to drink plenty of fluid, eat healthy meals and do not skip any meals. Try to eat protein with a every meal and eat a healthy snack such as fruit or nuts in between meals. Try to keep a regular sleep-wake schedule and try to exercise daily, particularly in the form of walking, 20-30 minutes a day, if you can.    Trigeminal neuralgia vs Migraines or both   As far as your medications are concerned, I would like to suggest:  Vicodin as needed for pain Baclofen (muscle relaxer) 1/2-1 tabs (5-10mg ) up to three times a day Steroid taper Nerve blocks (marcaine and lidocaine) Botox therapy  Our phone number is [phone]. We also have an after hours call service for urgent matters and there is a physician on-call for urgent questions. For any emergencies you know to call 911 or go to the nearest emergency room  Baclofen tablets What is this medicine? BACLOFEN (BAK loe fen) helps relieve spasms and cramping of muscles. It may be used to treat symptoms of multiple sclerosis or spinal cord injury. This medicine may be used for other purposes; ask your health care provider or pharmacist if you have questions. COMMON BRAND NAME(S): ED Baclofen, Lioresal What should I tell my health care provider before I take this medicine? They need to know if you have any of these conditions: -kidney disease -seizures -stroke -an unusual or allergic reaction to baclofen, other medicines, foods, dyes, or preservatives -pregnant or trying to get pregnant -breast-feeding How should I use this medicine? Take this medicine by mouth. Swallow it with a drink of water. Follow the directions on the prescription label. Do not take more medicine than you are told to take. Talk to your pediatrician regarding the use of this medicine in children. Special care may be needed. Overdosage: If you think you have taken too much of this medicine contact a poison control center or emergency room at once. NOTE: This  medicine is only for you. Do not share this medicine with others. What if I miss a dose? If you miss a dose, take it as soon as you can. If it is almost time for your next dose, take only that dose. Do not take double or extra doses. What may interact with this medicine? Do not take this medication with any of the following medicines: -narcotic medicines for cough This medicine may also interact with the following medications: -alcohol -antihistamines for allergy, cough and cold -certain medicines for anxiety or sleep -certain medicines for depression like amitriptyline, fluoxetine, sertraline -certain medicines for seizures like phenobarbital, primidone -general anesthetics like halothane, isoflurane, methoxyflurane, propofol -local anesthetics like lidocaine, pramoxine, tetracaine -medicines that relax muscles for surgery -narcotic medicines for pain -phenothiazines like chlorpromazine, mesoridazine, prochlorperazine, thioridazine This list may not describe all possible interactions. Give your health care provider a list of all the medicines, herbs, non-prescription drugs, or dietary supplements you use. Also tell them if you smoke, drink alcohol, or use illegal drugs. Some items may interact with your medicine. What should I watch for while using this medicine? Tell your doctor or health care professional if your symptoms do not start to get better or if they get worse. Do not suddenly stop taking your medicine. If you do, you may develop a severe reaction. If your doctor wants you to stop the medicine, the dose will be slowly lowered over time to avoid any side effects. Follow the advice of your doctor. You may get drowsy or dizzy. Do not  drive, use machinery, or do anything that needs mental alertness until you know how this medicine affects you. Do not stand or sit up quickly, especially if you are an older patient. This reduces the risk of dizzy or fainting spells. Alcohol may interfere  with the effect of this medicine. Avoid alcoholic drinks. If you are taking another medicine that also causes drowsiness, you may have more side effects. Give your health care provider a list of all medicines you use. Your doctor will tell you how much medicine to take. Do not take more medicine than directed. Call emergency for help if you have problems breathing or unusual sleepiness. What side effects may I notice from receiving this medicine? Side effects that you should report to your doctor or health care professional as soon as possible: -allergic reactions like skin rash, itching or hives, swelling of the face, lips, or tongue -breathing problems -changes in emotions or moods -changes in vision -chest pain -fast, irregular heartbeat -feeling faint or lightheaded, falls -hallucinations -loss of balance or coordination -ringing of the ears -seizures -trouble passing urine or change in the amount of urine -trouble walking -unusually weak or tired Side effects that usually do not require medical attention (report to your doctor or health care professional if they continue or are bothersome): -changes in taste -confusion -constipation -diarrhea -dry mouth -headache -muscle weakness -nausea, vomiting -trouble sleeping This list may not describe all possible side effects. Call your doctor for medical advice about side effects. You may report side effects to FDA at 1-800-FDA-1088. Where should I keep my medicine? Keep out of the reach of children. Store at room temperature between 15 and 30 degrees C (59 and 86 degrees F). Keep container tightly closed. Throw away any unused medicine after the expiration date. NOTE: This sheet is a summary. It may not cover all possible information. If you have questions about this medicine, talk to your doctor, pharmacist, or health care provider.  2018 Elsevier/Gold Standard (2015-01-24 15:56:23)   Migraine Headache A migraine headache is an  intense, throbbing pain on one side or both sides of the head. Migraines may also cause other symptoms, such as nausea, vomiting, and sensitivity to light and noise. What are the causes? Doing or taking certain things may also trigger migraines, such as:  Alcohol.  Smoking.  Medicines, such as: ? Medicine used to treat chest pain (nitroglycerine). ? Birth control pills. ? Estrogen pills. ? Certain blood pressure medicines.  Aged cheeses, chocolate, or caffeine.  Foods or drinks that contain nitrates, glutamate, aspartame, or tyramine.  Physical activity.  Other things that may trigger a migraine include:  Menstruation.  Pregnancy.  Hunger.  Stress, lack of sleep, too much sleep, or fatigue.  Weather changes.  What increases the risk? The following factors may make you more likely to experience migraine headaches:  Age. Risk increases with age.  Family history of migraine headaches.  Being Caucasian.  Depression and anxiety.  Obesity.  Being a woman.  Having a hole in the heart (patent foramen ovale) or other heart problems.  What are the signs or symptoms? The main symptom of this condition is pulsating or throbbing pain. Pain may:  Happen in any area of the head, such as on one side or both sides.  Interfere with daily activities.  Get worse with physical activity.  Get worse with exposure to bright lights or loud noises.  Other symptoms may include:  Nausea.  Vomiting.  Dizziness.  General sensitivity to bright  lights, loud noises, or smells.  Before you get a migraine, you may get warning signs that a migraine is developing (aura). An aura may include:  Seeing flashing lights or having blind spots.  Seeing bright spots, halos, or zigzag lines.  Having tunnel vision or blurred vision.  Having numbness or a tingling feeling.  Having trouble talking.  Having muscle weakness.  How is this diagnosed? A migraine headache can be  diagnosed based on:  Your symptoms.  A physical exam.  Tests, such as CT scan or MRI of the head. These imaging tests can help rule out other causes of headaches.  Taking fluid from the spine (lumbar puncture) and analyzing it (cerebrospinal fluid analysis, or CSF analysis).  How is this treated? A migraine headache is usually treated with medicines that:  Relieve pain.  Relieve nausea.  Prevent migraines from coming back.  Treatment may also include:  Acupuncture.  Lifestyle changes like avoiding foods that trigger migraines.  Follow these instructions at home: Medicines  Take over-the-counter and prescription medicines only as told by your health care provider.  Do not drive or use heavy machinery while taking prescription pain medicine.  To prevent or treat constipation while you are taking prescription pain medicine, your health care provider may recommend that you: ? Drink enough fluid to keep your urine clear or pale yellow. ? Take over-the-counter or prescription medicines. ? Eat foods that are high in fiber, such as fresh fruits and vegetables, whole grains, and beans. ? Limit foods that are high in fat and processed sugars, such as fried and sweet foods. Lifestyle  Avoid alcohol use.  Do not use any products that contain nicotine or tobacco, such as cigarettes and e-cigarettes. If you need help quitting, ask your health care provider.  Get at least 8 hours of sleep every night.  Limit your stress. General instructions   Keep a journal to find out what may trigger your migraine headaches. For example, write down: ? What you eat and drink. ? How much sleep you get. ? Any change to your diet or medicines.  If you have a migraine: ? Avoid things that make your symptoms worse, such as bright lights. ? It may help to lie down in a dark, quiet room. ? Do not drive or use heavy machinery. ? Ask your health care provider what activities are safe for you while  you are experiencing symptoms.  Keep all follow-up visits as told by your health care provider. This is important. Contact a health care provider if:  You develop symptoms that are different or more severe than your usual migraine symptoms. Get help right away if:  Your migraine becomes severe.  You have a fever.  You have a stiff neck.  You have vision loss.  Your muscles feel weak or like you cannot control them.  You start to lose your balance often.  You develop trouble walking.  You faint. This information is not intended to replace advice given to you by your health care provider. Make sure you discuss any questions you have with your health care provider. Document Released: 04/16/2005 Document Revised: 11/04/2015 Document Reviewed: 10/03/2015 Elsevier Interactive Patient Education  2017 Elsevier Inc.   Trigeminal Neuralgia Trigeminal neuralgia is a nerve disorder that causes attacks of severe facial pain. The attacks last from a few seconds to several minutes. They can happen for days, weeks, or months and then go away for months or years. Trigeminal neuralgia is also called tic douloureux.  What are the causes? This condition is caused by damage to a nerve in the face that is called the trigeminal nerve. An attack can be triggered by:  Talking.  Chewing.  Putting on makeup.  Washing your face.  Shaving your face.  Brushing your teeth.  Touching your face.  What increases the risk? This condition is more likely to develop in:  Women.  People who are 17 years of age or older.  What are the signs or symptoms? The main symptom of this condition is pain in the jaw, lips, eyes, nose, scalp, forehead, and face. The pain may be intense, stabbing, electric, or shock-like. How is this diagnosed? This condition is diagnosed with a physical exam. A CT scan or MRI may be done to rule out other conditions that can cause facial pain. How is this treated? This  condition may be treated with:  Avoiding the things that trigger your attacks.  Pain medicine.  Surgery. This may be done in severe cases if other medical treatment does not provide relief.  Follow these instructions at home:  Take over-the-counter and prescription medicines only as told by your health care provider.  If you wish to get pregnant, talk with your health care provider before you start trying to get pregnant.  Avoid the things that trigger your attacks. It may help to: ? Chew on the unaffected side of your mouth. ? Avoid touching your face. ? Avoid blasts of hot or cold air. Contact a health care provider if:  Your pain medicine is not helping.  You develop new, unexplained symptoms, such as: ? Double vision. ? Facial weakness. ? Changes in hearing or balance.  You become pregnant. Get help right away if:  Your pain is unbearable, and your pain medicine does not help. This information is not intended to replace advice given to you by your health care provider. Make sure you discuss any questions you have with your health care provider. Document Released: 04/13/2000 Document Revised: 12/18/2015 Document Reviewed: 08/09/2014 Elsevier Interactive Patient Education  [employer].

## 2016-09-28 NOTE — Progress Notes (Deleted)
Nerve block:  Xylocaine 2% (20 mg/ml) NDC 11031-594-58 Lot [id number] Exp 11/20  Bupivacaine 0.25% (5mg /ml) NDC 62863-817-71 LOT [id number] Exp: 03/22  Four 3 ml syringes: 1.5 ml marcaine and 1.5 ml lidocaine

## 2016-09-28 NOTE — Progress Notes (Signed)
[facility]    Provider:  Dr Jaynee Eagles Referring Provider: Marin Olp, MD Primary Care Physician:  Marin Olp, MD  CC:  Atypical face pain, likely trigeminal neuralgia  HPI:  [name] is a 56 y.o. female here as a referral from Dr. Yong Channel for trigeminal neuralgia.  January 2017 in the setting of stress she had a migraine. She had a migraine and as January went on her right temple started hurting. She had a bite guard made. Starts at the ear very deep and it shoots to the right side of the face to the temple and cheek, diffuse numbness. Felt like her ear was underwater. She tried essential oils, herbs, massage and exercise. She went to a chiropractor. She thought she had an ear infection. It can be very painful. Vicadin helps. Ice/heat helps. She also puts on topical magnesium. Initially the pain was constant, [facility] to [facility]. She tried Lyrica and did not tolerate. She has tried gabapentin in the past and she gets dizzy and off balance. She has a lot of side effects to medications. She is trying herbs like Black cumin, curcumin and boswella seed oil which is helping.  She has improved. Now the weather affects it. Not shooting or searing, a dull deep pain. Like Novacaine and ear and temple pain. She is having a migraine. No known triggers such as brushing or wind or eating. It is pounding and throbbing. Light does not bother. No sound sensitivity. Daily medications. She feels it is manageable. No other focal neurologic deficits, associated symptoms, inciting events or modifiable factors.  Reviewed notes, labs and imaging from outside physicians, which showed:  MRI brain : Unusually prominent and asymmetric loop of the RIGHT anterior inferior cerebellar artery projects far into the internal auditory canal, approaching the fundus. Correlate clinically as a cause for pulsatile tinnitus.  Review of Systems: Patient complains of symptoms per HPI as well as the  following symptoms: no rash, no lesions, no SOB, no CP. Pertinent negatives and positives per HPI. All others negative.   Social History   Social History  . Marital status: Married    Spouse name: N/A  . Number of children: 0  . Years of education: 12   Occupational History  . 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 Smoking status: Never Smoker  . Smokeless tobacco: Never Used  . Alcohol use Yes     Comment: wine with dinner a few nights a week  . Drug use: No  . Sexual activity: Not on file   Other Topics Concern  . Not on file   Social History Narrative   HSG, many types of education no formal degree. Married '85, no children. 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 Strong interest in herbology and alternative healing.    Right-handed   1-2 cups coffee per day.    Family History  Problem Relation Age of Onset  . Seizures Mother   . Migraines Mother   . Hypertension Father   . Other Father   . Hyperlipidemia Father   . Cancer Sister        breast  . Cancer Other        breast    Past Medical History:  Diagnosis Date  . Adhesive capsulitis of shoulder   . Aortic aneurysm ([place])   . Cancer [facility]) 2008  Breast  . Congenital anomaly of aortic arch   . Labyrinthitis, unspecified   . Malignant neoplasm of breast (female), unspecified site   . Other B-complex deficiencies   . Other malaise and fatigue   . Unspecified hearing loss   . Unspecified hereditary and idiopathic peripheral neuropathy   . Unspecified tinnitus     Past Surgical History:  Procedure Laterality Date  . ABDOMINAL HYSTERECTOMY  04/2010   robotic  . BASAL CELL CARCINOMA EXCISION  2003   rt eye area  . BREAST FIBROADENOMA SURGERY  1981   Left  . BREAST LUMPECTOMY  Oct.29 & Mar 19, 2007   Right  . IR GENERIC HISTORICAL  04/16/2016   IR RADIOLOGIST EVAL & MGMT  04/16/2016 [facility] RAD  . LAPAROSCOPY  1982   for endometriosis  . wisdom teeth extracted      Current Outpatient Prescriptions  Medication Sig Dispense Refill  . Ascorbic Acid (VITAMIN C) 1000 MG tablet Take 1,000 mg by mouth daily.    Marland Kitchen b complex vitamins tablet Take 1 tablet by mouth daily.    Azucena Freed Serrata (BOSWELLIA PO) Take 100 mg by mouth 2 (two) times daily.    . Cholecalciferol (VITAMIN D) 2000 UNITS CAPS Take by mouth daily.    . clonazePAM (KLONOPIN) 0.5 MG tablet TAKE 1 TABLET BY MOUTH TWICE DAILY AS NEEDED ANXIETY 30 tablet 2  . Coenzyme Q10 (COQ10 PO) Take 100 mg by mouth 2 (two) times daily.    Marland Kitchen HYDROcodone-acetaminophen (NORCO/VICODIN) 5-325 MG tablet Take 1 tablet by mouth every 6 (six) hours as needed for moderate pain. 60 tablet 0  . Multiple Vitamin (MULTIVITAMIN) tablet Take 1 tablet by mouth daily.    . Probiotic Product (PROBIOTIC DAILY PO) Take by mouth daily.    . Turmeric 500 MG CAPS Take 500 mg by mouth 2 (two) times daily.    Marland Kitchen UNABLE TO FIND Take 100 mg by mouth 2 (two) times daily. Black Cumin Seed Oil for pain    . venlafaxine XR (EFFEXOR-XR) 37.5 MG 24 hr capsule Take 37.5 mg by mouth daily with breakfast.    . baclofen (LIORESAL) 10 MG tablet Take 0.5 to 1 tablet (5 - 10 mg total) by mouth 3 (three) times daily as needed 90 each 0  . methylPREDNISolone (MEDROL DOSEPAK) 4 MG TBPK tablet follow package directions 21 tablet 1   No current facility-administered medications for this visit.     Allergies as of 09/28/2016 - Review Complete 09/28/2016  Allergen Reaction Noted  . Bee venom Anaphylaxis 12/14/2014  . Other Anaphylaxis   . Polysorbate Anaphylaxis 02/28/2012  . Latex Hives   . Lyrica [pregabalin] Nausea And Vomiting and Other (See Comments) 09/17/2016  . Methylprednisolone Other (See Comments)   . Doxycycline hyclate Rash 09/17/2016  . Multihance [gadobenate] Itching 05/22/2005    Vitals: BP 128/82   Pulse 76   Ht 5' 6.5" (1.689  m)   Wt 131 lb (59.4 kg)   BMI 20.83 kg/m  Last Weight:  Wt Readings from Last 1 Encounters:  09/28/16 131 lb (59.4 kg)   Last Height:   Ht Readings from Last 1 Encounters:  09/28/16 5' 6.5" (1.689 m)     Physical exam: Exam: Gen: NAD, conversant, well nourised, obese, well groomed                     CV: RRR, no MRG. No Carotid Bruits. No peripheral edema, warm, nontender Eyes: Conjunctivae clear  without exudates or hemorrhage  Neuro: Detailed Neurologic Exam  Speech:    Speech is normal; fluent and spontaneous with normal comprehension.  Cognition:    The patient is oriented to person, place, and time;     recent and remote memory intact;     language fluent;     normal attention, concentration,     fund of knowledge Cranial Nerves:    The pupils are equal, round, and reactive to light. The fundi are normal and spontaneous venous pulsations are present. Visual fields are full to finger confrontation. Extraocular movements are intact. Trigeminal sensation is intact and the muscles of mastication are normal. The face is symmetric. The palate elevates in the midline. Hearing intact. Voice is normal. Shoulder shrug is normal. The tongue has normal motion without fasciculations.   Coordination:    Normal finger to nose and heel to shin. Normal rapid alternating movements.   Gait:    Heel-toe and tandem gait are normal.   Motor Observation:    No asymmetry, no atrophy, and no involuntary movements noted. Tone:    Normal muscle tone.    Posture:    Posture is normal. normal erect    Strength:    Strength is V/V in the upper and lower limbs.      Sensation: intact to LT     Reflex Exam:  DTR's:    Deep tendon reflexes in the upper and lower extremities are normal bilaterally.   Toes:    The toes are downgoing bilaterally.   Clonus:    Clonus is absent.     Assessment/Plan:  Patient with atypical facial pain in the trigeminal nerve distribution. MRI showed a  vascular loop possibly compressing the trigeminal nerve at the origin.  Trigeminal neuralgia vs Migraines or both   As far as your medications are concerned, I would like to suggest:  Vicodin as needed for pain very sparingly Baclofen (muscle relaxer) 1/2-1 tabs (5-10mg ) up to three times a day Steroid taper Recommendations include: Nerve blocks (marcaine and lidocaine), or Botox(Xeomin is preservative free as she is allergic) or radiofrequency ablation. She is not interested in decompression therapy for the vascular loop seen on MRI. She will discuss with husband and let us know.  Provided education on trigeminal neuralgia and migraines.Provided extensive reading material and sites online for research and education.    Sarina Ill, MD  [facility] [address] [place], [place] [address]  Phone [phone] Fax [phone]  A total of 45 minutes was spent face-to-face with this patient. Over half this time was spent on counseling patient on the trigeminal neuralgia and migraine diagnosis and different diagnostic and therapeutic options available.

## 2016-09-30 DIAGNOSIS — G43109 Migraine with aura, not intractable, without status migrainosus: Secondary | ICD-10-CM | POA: Insufficient documentation

## 2016-09-30 DIAGNOSIS — G500 Trigeminal neuralgia: Secondary | ICD-10-CM | POA: Insufficient documentation

## 2016-10-13 ENCOUNTER — Other Ambulatory Visit: Payer: Self-pay | Admitting: Neurology

## 2016-10-13 MED ORDER — CYCLOBENZAPRINE HCL 5 MG PO TABS: 5.0000 mg | ORAL_TABLET | Freq: Three times a day (TID) | ORAL | 12 refills | Status: DC | PRN

## 2016-10-16 ENCOUNTER — Encounter: Payer: Self-pay | Admitting: Neurology

## 2016-11-12 MED FILL — VENLAFAXINE HCL ER 37.5 MG: 37.5 | 60 days supply | Qty: 60 | Fill #2

## 2016-11-13 ENCOUNTER — Telehealth: Payer: Self-pay | Admitting: Oncology

## 2016-11-13 NOTE — Telephone Encounter (Signed)
Call patient to confirm appointment and she said that she saw it on MyChart and needed to reschedule because she would be out of town.  She is r/s for 8/17 labs and 8/27 with Magrinat

## 2016-11-14 ENCOUNTER — Other Ambulatory Visit: Payer: Self-pay | Admitting: Surgery

## 2016-11-14 ENCOUNTER — Other Ambulatory Visit: Payer: Self-pay | Admitting: Oncology

## 2016-11-14 DIAGNOSIS — I712 Thoracic aortic aneurysm, without rupture: Secondary | ICD-10-CM

## 2016-11-14 DIAGNOSIS — Z1231 Encounter for screening mammogram for malignant neoplasm of breast: Secondary | ICD-10-CM

## 2016-11-14 DIAGNOSIS — I7121 Aneurysm of the ascending aorta, without rupture: Secondary | ICD-10-CM

## 2016-11-17 ENCOUNTER — Encounter: Payer: Self-pay | Admitting: Surgery

## 2016-11-20 ENCOUNTER — Ambulatory Visit
Admission: RE | Admit: 2016-11-20 | Discharge: 2016-11-20 | Disposition: A | Discharge status: Home / Self Care | Payer: 59 | Source: Ambulatory Visit | Attending: Oncology | Admitting: Oncology

## 2016-11-20 DIAGNOSIS — Z1231 Encounter for screening mammogram for malignant neoplasm of breast: Secondary | ICD-10-CM

## 2016-11-22 ENCOUNTER — Other Ambulatory Visit: Payer: 59

## 2016-11-29 ENCOUNTER — Ambulatory Visit: Payer: 59 | Admitting: Oncology

## 2016-12-14 ENCOUNTER — Other Ambulatory Visit (HOSPITAL_BASED_OUTPATIENT_CLINIC_OR_DEPARTMENT_OTHER): Payer: 59

## 2016-12-14 DIAGNOSIS — Z853 Personal history of malignant neoplasm of breast: Secondary | ICD-10-CM | POA: Diagnosis not present

## 2016-12-14 DIAGNOSIS — F418 Other specified anxiety disorders: Secondary | ICD-10-CM

## 2016-12-14 DIAGNOSIS — C50511 Malignant neoplasm of lower-outer quadrant of right female breast: Secondary | ICD-10-CM

## 2016-12-14 LAB — CBC WITH DIFFERENTIAL/PLATELET
BASO%: 1.1 % (ref 0.0–2.0)
BASOS ABS: 0.1 10*3/uL (ref 0.0–0.1)
EOS ABS: 0.3 10*3/uL (ref 0.0–0.5)
EOS%: 5.4 % (ref 0.0–7.0)
HEMATOCRIT: 43.1 % (ref 34.8–46.6)
HEMOGLOBIN: 14.6 g/dL (ref 11.6–15.9)
LYMPH#: 1.6 10*3/uL (ref 0.9–3.3)
LYMPH%: 29 % (ref 14.0–49.7)
MCH: 30.5 pg (ref 25.1–34.0)
MCHC: 33.9 g/dL (ref 31.5–36.0)
MCV: 90.1 fL (ref 79.5–101.0)
MONO#: 0.5 10*3/uL (ref 0.1–0.9)
MONO%: 8.5 % (ref 0.0–14.0)
NEUT#: 3.2 10*3/uL (ref 1.5–6.5)
NEUT%: 56 % (ref 38.4–76.8)
PLATELETS: 305 10*3/uL (ref 145–400)
RBC: 4.79 10*6/uL (ref 3.70–5.45)
RDW: 13 % (ref 11.2–14.5)
WBC: 5.7 10*3/uL (ref 3.9–10.3)

## 2016-12-14 LAB — COMPREHENSIVE METABOLIC PANEL
ALBUMIN: 4.1 g/dL (ref 3.5–5.0)
ALK PHOS: 72 U/L (ref 40–150)
ALT: 24 U/L (ref 0–55)
AST: 26 U/L (ref 5–34)
Anion Gap: 9 mEq/L (ref 3–11)
BUN: 14.9 mg/dL (ref 7.0–26.0)
CALCIUM: 9 mg/dL (ref 8.4–10.4)
CO2: 27 mEq/L (ref 22–29)
Chloride: 100 mEq/L (ref 98–109)
Creatinine: 0.8 mg/dL (ref 0.6–1.1)
EGFR: 84 mL/min/{1.73_m2} — AB (ref >90)
Glucose: 98 mg/dl (ref 70–140)
POTASSIUM: 4.1 meq/L (ref 3.5–5.1)
Sodium: 136 mEq/L (ref 136–145)
Total Bilirubin: 0.67 mg/dL (ref 0.20–1.20)
Total Protein: 7.1 g/dL (ref 6.4–8.3)

## 2016-12-19 ENCOUNTER — Ambulatory Visit: Payer: 59 | Admitting: Surgery

## 2016-12-20 ENCOUNTER — Ambulatory Visit: Payer: 59 | Admitting: Oncology

## 2016-12-24 ENCOUNTER — Telehealth: Payer: Self-pay | Admitting: Oncology

## 2016-12-24 ENCOUNTER — Ambulatory Visit (HOSPITAL_BASED_OUTPATIENT_CLINIC_OR_DEPARTMENT_OTHER): Payer: 59 | Admitting: Oncology

## 2016-12-24 VITALS — BP 135/79 | HR 81 | Temp 98.6°F | Resp 17 | Ht 66.5 in | Wt 134.7 lb

## 2016-12-24 DIAGNOSIS — Z170 Estrogen receptor positive status [ER+]: Principal | ICD-10-CM

## 2016-12-24 DIAGNOSIS — N644 Mastodynia: Secondary | ICD-10-CM | POA: Diagnosis not present

## 2016-12-24 DIAGNOSIS — C50511 Malignant neoplasm of lower-outer quadrant of right female breast: Secondary | ICD-10-CM

## 2016-12-24 DIAGNOSIS — Z853 Personal history of malignant neoplasm of breast: Secondary | ICD-10-CM | POA: Diagnosis not present

## 2016-12-24 DIAGNOSIS — I719 Aortic aneurysm of unspecified site, without rupture: Secondary | ICD-10-CM

## 2016-12-24 DIAGNOSIS — I7121 Aneurysm of the ascending aorta, without rupture: Secondary | ICD-10-CM

## 2016-12-24 NOTE — Telephone Encounter (Signed)
No 8/27 los. °

## 2016-12-24 NOTE — Progress Notes (Signed)
ID: KIAN GAMARRA   DOB: 1961-04-17  MR#: [id number]  NWG#:[id number]  PCP: Mary Olp, MD GYN: [name] SU: OTHER MD: [name], [name], [name]  HISTORY OF PRESENT ILLNESS: From the original intake note:  [name] is followed very closely mammographically because of a strong family history of breast cancer which is detailed below.  She had a right breast ultrasound in January 2008 for follow up of a right breast fibroadenoma (this found no significant change as compared to January 2007 or 21-Nov-2005).    On 11-08-06 she had bilateral diagnostic mammograms and ultrasounds.  She does have dense parenchyma on the right.  There were no findings suspicious for malignancy and the fibroadenoma which was being followed was again found to be unchanged.  In the left breast there were two smoothly  marginated masses which were new compared to prior and they were both found to be cysts by ultrasound.  Because of the patient's high baseline cancer risk, bilateral MRI's were again obtained on 11-25-06 (the prior MRI I believe had been two years before).  Again, the likely fibroadenoma in the lateral aspect of the right breast was noted.  More importantly, a 1.1 cm. enhancing nodule in the inferior aspect of the right breast appeared slightly larger than previously with the suggestion of very minimal margin irregularity.  There were no abnormal appearing lymph nodes and incidentally no liver abnormality was noted.  Because of this change, the focused right breast ultrasound was obtained on  11-28-06.  This showed a 1.4 cm. hypoechoic area where the MRI abnormality was found however, a little bit more laterally than expected, and for that reason MRI biopsy was preferred.  This was performed on 01-20-07 and showed ([id number] and [id number]) a high grade ductal carcinoma in situ which was 99% ER and 98% PR positive.   Her subsequent history is as detailed below  INTERVAL HISTORY: [name] returns  today for follow-up of her estrogen receptor positive breast cancer. She is under observation alone. She is completing 10 years of follow-up with this visit and is ready to "graduate".  Since her last visit here, her stepmother, who was living with her and [name], died, in 11/22/2015. This was a very difficult. For the entire family and [name] is benefiting from counseling through hospice.  REVIEW OF SYSTEMS: She is now working with [name] Re: The trigeminal neuralgia/migraine complex that [name] has been fighting for many years. Little Flexeril and Vicodin is helpful, but but she says helps the most is black cumin seed oil and curcumin. She says when she forgets to take it or does not take it for any reason that problem gets much worse. She continues to have discomfort in the upper inner quadrant of the right breast. This is unchanged however from baseline. Aside from that a detailed review of systems today was stable  PAST MEDICAL HISTORY: Past Medical History:  Diagnosis Date  . Adhesive capsulitis of shoulder   . Aortic aneurysm ([place])   . Cancer [facility]) 2008   Breast  . Congenital anomaly of aortic arch   . Labyrinthitis, unspecified   . Malignant neoplasm of breast (female), unspecified site   . Other B-complex deficiencies   . Other malaise and fatigue   . Unspecified hearing loss   . Unspecified hereditary and idiopathic peripheral neuropathy   . Unspecified tinnitus   1. Removal of a left breast fibroadenoma in 1980. 2. History of endometriosis diagnosed by laparoscopy I  believe in 2003. 3. History of right endometrial artery embolization for fibroid control by Interventional Radiology in April 2008. 4. History of basal cell carcinoma removed from the area beneath the left orbit, status post Mohs surgery.  5. History of adhesive capsulitis of both shoulders. 6. History of an ascending aortic aneurysm measuring 3.7 cm.  PAST SURGICAL HISTORY: Past Surgical History:  Procedure  Laterality Date  . ABDOMINAL HYSTERECTOMY  04/2010   robotic  . BASAL CELL CARCINOMA EXCISION  2003   rt eye area  . BREAST FIBROADENOMA SURGERY  1981   Left  . BREAST LUMPECTOMY Right Oct.29 & Mar 19, 2007   Right  . IR GENERIC HISTORICAL  04/16/2016   IR RADIOLOGIST EVAL & MGMT 04/16/2016 [facility]INTER[facility] RAD  . LAPAROSCOPY  1982   for endometriosis  . wisdom teeth extracted    s/p TAH-BSO 05/11/2010  FAMILY HISTORY Family History  Problem Relation Age of Onset  . Seizures Mother   . Migraines Mother   . Hypertension Father   . Other Father   . Hyperlipidemia Father   . Breast cancer Sister   . Cancer Sister        breast  . Cancer Other        breast  . Breast cancer Maternal Grandmother   The patient has one full sister who was diagnosed with breast cancer at the age of 10.  She is now 58 and doing very well.  She has a half brother who is 72.  The patient's mother committed suicide at the age of 36.  She did have fibrocystic change but, of course, we would not know if she had lived to develop breast cancer.  The patient's mother's mother however, died from breast cancer at the age of 23.  It had been diagnosed when she was 26.  That maternal grandmother had an additional six sisters, one of whom also had breast cancer, the patient does not know at what age.  On the patient's father's side, the father had two sisters, one of them developed breast cancer in her 88s and apparently died from metastatic breast cancer. There is a history of stomach cancer on her father's side and of bladder cancer on her mother's side, but this is now two degrees removed.  She has one cousin with ovarian cancer. [name] has been tested for a deleterious mutation and it has not been found.  GYNECOLOGIC HISTORY: She is GX P0. She is s/p TAH BSO as of January 2012, with benign pathology  SOCIAL HISTORY: She is a Education administrator.  She is married to [name] who works as a Engineering geologist.  They  have six cats.  She is not a church attender but describes herself as a very spiritual person.     ADVANCED DIRECTIVES: her husband is [name]  HEALTH MAINTENANCE: Social History  Substance Use Topics  . Smoking status: Never Smoker  . Smokeless tobacco: Never Used  . Alcohol use Yes     Comment: wine with dinner a few nights a week     Colonoscopy: July 2016  PAP:  Bone density:  Lipid panel:  Allergies  Allergen Reactions  . Bee Venom Anaphylaxis  . Other Anaphylaxis  . Polysorbate Anaphylaxis    "Polysorbate 80 preservative"  . Latex Hives  . Lyrica [Pregabalin] Nausea And Vomiting and Other (See Comments)    Dizziness   . Methylprednisolone Other (See Comments)    Injection only - has polysorbate 80 preservative  .  Doxycycline Hyclate Rash  . Multihance [Gadobenate] Itching    Pt developed itching after receiving Magnavist-MRI contrast    Current Outpatient Prescriptions  Medication Sig Dispense Refill  . Ascorbic Acid (VITAMIN C) 1000 MG tablet Take 1,000 mg by mouth daily.    Marland Kitchen b complex vitamins tablet Take 1 tablet by mouth daily.    Azucena Freed Serrata (BOSWELLIA PO) Take 100 mg by mouth 2 (two) times daily.    . Cholecalciferol (VITAMIN D) 2000 UNITS CAPS Take by mouth daily.    . clonazePAM (KLONOPIN) 0.5 MG tablet TAKE 1 TABLET BY MOUTH TWICE DAILY AS NEEDED ANXIETY 30 tablet 2  . Coenzyme Q10 (COQ10 PO) Take 100 mg by mouth 2 (two) times daily.    . cyclobenzaprine (FLEXERIL) 5 MG tablet Take 1 tablet (5 mg total) by mouth every 8 (eight) hours as needed for muscle spasms. 90 tablet 12  . HYDROcodone-acetaminophen (NORCO/VICODIN) 5-325 MG tablet Take 1 tablet by mouth every 6 (six) hours as needed for moderate pain. 60 tablet 0  . methylPREDNISolone (MEDROL DOSEPAK) 4 MG TBPK tablet follow package directions 21 tablet 1  . Multiple Vitamin (MULTIVITAMIN) tablet Take 1 tablet by mouth daily.    . Probiotic Product (PROBIOTIC DAILY PO) Take by mouth daily.    .  Turmeric 500 MG CAPS Take 500 mg by mouth 2 (two) times daily.    Marland Kitchen UNABLE TO FIND Take 100 mg by mouth 2 (two) times daily. Black Cumin Seed Oil for pain    . venlafaxine XR (EFFEXOR-XR) 37.5 MG 24 hr capsule Take 37.5 mg by mouth daily with breakfast.     No current facility-administered medications for this visit.     OBJECTIVE: Middle-aged white woman In no acute distress  Vitals:   12/24/16 1205  BP: 135/79  Pulse: 81  Resp: 17  Temp: 98.6 F (37 C)  SpO2: 98%     Body mass index is 21.42 kg/m.    ECOG FS: 0 Filed Weights   12/24/16 1205  Weight: 134 lb 11.2 oz (61.1 kg)   Sclerae unicteric, pupils round and equal Oropharynx clear and moist No cervical or supraclavicular adenopathy Lungs no rales or rhonchi Heart regular rate and rhythm Abd soft, nontender, positive bowel sounds MSK no focal spinal tenderness, no upper extremity lymphedema Neuro: nonfocal, well oriented, positive affect Breasts: The right breast is status post lumpectomy. There is no evidence of local recurrence. The left breast is benign. Both axillae are benign.  LAB RESULTS: Lab Results  Component Value Date   WBC 5.7 12/14/2016   NEUTROABS 3.2 12/14/2016   HGB 14.6 12/14/2016   HCT 43.1 12/14/2016   MCV 90.1 12/14/2016   PLT 305 12/14/2016      Chemistry      Component Value Date/Time   NA 136 12/14/2016 0925   K 4.1 12/14/2016 0925   CL 108 03/30/2014 1018   CL 105 08/20/2012 0809   CO2 27 12/14/2016 0925   BUN 14.9 12/14/2016 0925   CREATININE 0.8 12/14/2016 0925      Component Value Date/Time   CALCIUM 9.0 12/14/2016 0925   ALKPHOS 72 12/14/2016 0925   AST 26 12/14/2016 0925   ALT 24 12/14/2016 0925   BILITOT 0.67 12/14/2016 0925       Lab Results  Component Value Date   LABCA2 35 04/20/2015     STUDIES: Bilateral screening mammography with tomography at the [facility] 11/20/2016 found the breast density to be category  C. There was no evidence of  malignancy.  ASSESSMENT: 56 y.o. [place] woman   (1)  status post right lumpectomy and sentinel lymph node biopsy October of 2008 for a 7 mm grade 1 invasive ductal carcinoma, strongly estrogen and progesterone receptor positive, HER2 negative, with an Oncotype recurrence score of 20, predicting a recurrence of 13% after 5 years on tamoxifen,   (2)  status post Abraxane given weekly x8 completed February 2009, followed by radiation completed April 2009, at which time she started tamoxifen.    (3)  She has also tried aromatase inhibitors, namely letrozole and exemestane, with no success.  She resumed tamoxifen October 2012, with poor tolerance; held it for six months, then tried again October 2013, finally went off it permanently April 2014  PLAN:  [name] is just about 10 years out from definitive surgery for her breast cancer with no evidence of disease recurrence. This is very favorable.  While she does have some issues which concern her, none of them are suggestive of disease recurrence or new breast cancer developing.  At this point I feel comfortable releasing her to her primary care physician. As she will need in terms of breast cancer follow-up is yearly mammography, which usually obtains in July at the [facility], and a yearly physician breast exam.  I will be glad to see her at any point in the future if on when the need arises, but as of now we're making her further routine appointment for her here.   MAGRINAT,GUSTAV C    12/24/2016

## 2017-01-09 ENCOUNTER — Other Ambulatory Visit: Payer: Self-pay

## 2017-01-09 ENCOUNTER — Other Ambulatory Visit: Payer: Self-pay | Admitting: Oncology

## 2017-01-09 MED ORDER — CLONAZEPAM 0.5 MG PO TABS: ORAL_TABLET | ORAL | 0 refills | Status: DC

## 2017-01-09 MED FILL — VENLAFAXINE HCL ER 37.5 MG: 37.5 | 60 days supply | Qty: 60 | Fill #3

## 2017-02-18 ENCOUNTER — Ambulatory Visit (INDEPENDENT_AMBULATORY_CARE_PROVIDER_SITE_OTHER): Payer: 59 | Admitting: Neurology

## 2017-02-18 VITALS — BP 114/76 | HR 77 | Ht 66.0 in | Wt 137.0 lb

## 2017-02-18 DIAGNOSIS — M542 Cervicalgia: Secondary | ICD-10-CM | POA: Diagnosis not present

## 2017-02-18 DIAGNOSIS — M7918 Myalgia, other site: Secondary | ICD-10-CM | POA: Diagnosis not present

## 2017-02-18 DIAGNOSIS — G500 Trigeminal neuralgia: Secondary | ICD-10-CM

## 2017-02-18 MED ORDER — HYDROCODONE-ACETAMINOPHEN 5-325 MG PO TABS: 1.0000 | ORAL_TABLET | Freq: Four times a day (QID) | ORAL | 0 refills | Status: DC | PRN

## 2017-02-18 MED ORDER — VENLAFAXINE HCL ER 37.5 MG PO CP24: 37.5000 mg | ORAL_CAPSULE | Freq: Every day | ORAL | 4 refills | Status: DC

## 2017-02-18 MED FILL — HYDROCODON-APAP 5-325: 5-325 | 15 days supply | Qty: 60 | Fill #0

## 2017-02-18 NOTE — Progress Notes (Signed)
Lidocaine 2% 9 mL injection given by MD LOT: [id number] EXP: 02/22 [place]: [id number]

## 2017-02-18 NOTE — Progress Notes (Signed)
[facility]    Provider:  Dr Jaynee Eagles Referring Provider: Marin Olp, MD Primary Care Physician:  Marin Olp, MD  CC:  Atypical face pain, likely trigeminal neuralgia  Interval history: She has pain 24x7, she is tingling with paresthesias on the right in the V2/V3 distribution, deep at the front of the ear and in the temple and jaw, her molares hurt and are throbbing. She has been to the dentist. She gets "squiggly lines" in the left eye and starting to have pain on the left side as well. She also has sharp, shooting pain, brief, severe on the right in the V2/V3 distribution. She takes feverfew, CoQ10, cumin seed oil, she takes a B complex with B2 in it. Discussed CoQ10. She has pulsatile Tinnitus in the left ear. She has pain in the right ear so difficult to wear hearing aids. She has negative pressure in both ears. She can't get them to pop. When they pop she can hear well and then she has. Discussed dry needling. Pain in her face is like a hot poker and travels down the V2/v3 area severe, pain is continuous.  HPI:  [name] is a 56 y.o. female here as a referral from Dr. Yong Channel for trigeminal neuralgia.  January 2017 in the setting of stress she had a migraine. She had a migraine and as January went on her right temple started hurting. She had a bite guard made. Starts at the ear very deep and it shoots to the right side of the face to the temple and cheek, diffuse numbness. Felt like her ear was underwater. She tried essential oils, herbs, massage and exercise. She went to a chiropractor. She thought she had an ear infection. It can be very painful. Vicadin helps. Ice/heat helps. She also puts on topical magnesium. Initially the pain was constant, they went to [facility]. She tried Lyrica and did not tolerate. She has tried gabapentin in the past and she gets dizzy and off balance. She has a lot of side effects to medications. She is trying herbs like Black  cumin, curcumin and boswella seed oil which is helping.  She has improved. Now the weather affects it. Not shooting or searing, a dull deep pain. Like Novacaine and ear and temple pain. She is having a migraine. No known triggers such as brushing or wind or eating. It is pounding and throbbing. Light does not bother. No sound sensitivity. Daily medications. She feels it is manageable. No other focal neurologic deficits, associated symptoms, inciting events or modifiable factors.  Reviewed notes, labs and imaging from outside physicians, which showed:  MRI brain : Unusually prominent and asymmetric loop of the RIGHT anterior inferior cerebellar artery projects far into the internal auditory canal, approaching the fundus. Correlate clinically as a cause for pulsatile tinnitus.  Review of Systems: Patient complains of symptoms per HPI as well as the following symptoms: no rash, no lesions, no SOB, no CP. Pertinent negatives and positives per HPI. All others negative.  Social History   Social History  . Marital status: Married    Spouse name: N/A  . Number of children: 0  . Years of education: 12   Occupational History  . 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 Smoking status: Never Smoker  . Smokeless tobacco: Never Used  . Alcohol use Yes     Comment: wine with dinner a few nights a week  .  Drug use: No  . Sexual activity: Not on file   Other Topics Concern  . Not on file   Social History Narrative   HSG, many types of education no formal degree. Married '85, no children. 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 Strong interest in herbology and alternative healing.    Right-handed   1-2 cups coffee per day.    Family History  Problem Relation Age of Onset  . Seizures Mother   . Migraines Mother   . Hypertension Father   . Other Father   .  Hyperlipidemia Father   . Breast cancer Sister   . Cancer Sister        breast  . Cancer Other        breast  . Breast cancer Maternal Grandmother     Past Medical History:  Diagnosis Date  . Adhesive capsulitis of shoulder   . Aortic aneurysm ([place])   . Cancer [facility]) 2008   Breast  . Congenital anomaly of aortic arch   . Labyrinthitis, unspecified   . Malignant neoplasm of breast (female), unspecified site   . Other B-complex deficiencies   . Other malaise and fatigue   . Unspecified hearing loss   . Unspecified hereditary and idiopathic peripheral neuropathy   . Unspecified tinnitus     Past Surgical History:  Procedure Laterality Date  . ABDOMINAL HYSTERECTOMY  04/2010   robotic  . BASAL CELL CARCINOMA EXCISION  2003   rt eye area  . BREAST FIBROADENOMA SURGERY  1981   Left  . BREAST LUMPECTOMY Right Oct.29 & Mar 19, 2007   Right  . IR GENERIC HISTORICAL  04/16/2016   IR RADIOLOGIST EVAL & MGMT 04/16/2016 [facility] RAD  . LAPAROSCOPY  1982   for endometriosis  . wisdom teeth extracted      Current Outpatient Prescriptions  Medication Sig Dispense Refill  . Ascorbic Acid (VITAMIN C) 1000 MG tablet Take 1,000 mg by mouth daily.    Marland Kitchen b complex vitamins tablet Take 1 tablet by mouth daily.    Azucena Freed Serrata (BOSWELLIA PO) Take 100 mg by mouth 2 (two) times daily.    . Cholecalciferol (VITAMIN D) 2000 UNITS CAPS Take by mouth daily.    . clonazePAM (KLONOPIN) 0.5 MG tablet TAKE 1 TABLET BY MOUTH TWICE DAILY AS NEEDED ANXIETY 60 tablet 0  . Coenzyme Q10 (COQ10 PO) Take 100 mg by mouth 2 (two) times daily.    . cyclobenzaprine (FLEXERIL) 5 MG tablet Take 1 tablet (5 mg total) by mouth every 8 (eight) hours as needed for muscle spasms. 90 tablet 12  . HYDROcodone-acetaminophen (NORCO/VICODIN) 5-325 MG tablet Take 1 tablet by mouth every 6 (six) hours as needed for moderate pain. 60 tablet 0  . methylPREDNISolone (MEDROL DOSEPAK) 4 MG TBPK tablet follow package  directions 21 tablet 1  . Multiple Vitamin (MULTIVITAMIN) tablet Take 1 tablet by mouth daily.    . Probiotic Product (PROBIOTIC DAILY PO) Take by mouth daily.    . Turmeric 500 MG CAPS Take 500 mg by mouth 2 (two) times daily.    Marland Kitchen UNABLE TO FIND Take 500 mg by mouth 2 (two) times daily. Black Cumin Seed Oil for pain     . venlafaxine XR (EFFEXOR-XR) 37.5 MG 24 hr capsule Take 37.5 mg by mouth daily with breakfast.     No current facility-administered medications for this visit.     Allergies as  of 02/18/2017 - Review Complete 02/18/2017  Allergen Reaction Noted  . Bee venom Anaphylaxis 12/14/2014  . Other Anaphylaxis   . Polysorbate Anaphylaxis 02/28/2012  . Latex Hives   . Lyrica [pregabalin] Nausea And Vomiting and Other (See Comments) 09/17/2016  . Methylprednisolone Other (See Comments)   . Doxycycline hyclate Rash 09/17/2016  . Multihance [gadobenate] Itching 05/22/2005    Vitals: BP 114/76   Pulse 77   Ht 5\' 6"  (1.676 m)   Wt 137 lb (62.1 kg)   BMI 22.11 kg/m  Last Weight:  Wt Readings from Last 1 Encounters:  02/18/17 137 lb (62.1 kg)   Last Height:   Ht Readings from Last 1 Encounters:  02/18/17 5\' 6"  (1.676 m)   Physical exam: Exam: Gen: NAD, conversant, well nourised, well groomed                     CV: RRR, no MRG. No Carotid Bruits. No peripheral edema, warm, nontender Eyes: Conjunctivae clear without exudates or hemorrhage  Neuro: Detailed Neurologic Exam  Speech:    Speech is normal; fluent and spontaneous with normal comprehension.  Cognition:    The patient is oriented to person, place, and time;     recent and remote memory intact;     language fluent;     normal attention, concentration,     fund of knowledge Cranial Nerves:    The pupils are equal, round, and reactive to light. The fundi are normal and spontaneous venous pulsations are present. Visual fields are full to finger confrontation. Extraocular movements are intact. Trigeminal  sensation is intact and the muscles of mastication are normal. The face is symmetric. The palate elevates in the midline. Hearing intact. Voice is normal. Shoulder shrug is normal. The tongue has normal motion without fasciculations.   Coordination:    Normal finger to nose and heel to shin. Normal rapid alternating movements.   Gait:    Heel-toe and tandem gait are normal.   Motor Observation:    No asymmetry, no atrophy, and no involuntary movements noted. Tone:    Normal muscle tone.    Posture:    Posture is normal. normal erect    Strength:    Strength is V/V in the upper and lower limbs.      Sensation: intact to LT     Reflex Exam:  DTR's:    Deep tendon reflexes in the upper and lower extremities are normal bilaterally.   Toes:    The toes are downgoing bilaterally.   Clonus:    Clonus is absent.     Assessment/Plan:  Patient with atypical facial pain in the trigeminal nerve distribution. MRI showed a vascular loop possibly compressing the trigeminal nerve at the origin.  - Trigeminal neuralgia vs Migraines or both  - Discussed dry needling, will refer to PT for dry needling for neck paoin - Continue Cefaly for migraines - She is not interested in daily medications, she tried Lyric and Gabapentin and had significant side effects, recommend Tegretol or Trileptal. - Trigeminal and Trigger point injections  As far as your medications are concerned, I would like to suggest:  Vicodin as needed for pain very sparingly flexeril up to three times a day for the neck pain and the trigeminal neuralgia (Baclofen did not help) Recommendations include: Nerve blocks (marcaine and lidocaine), or Botox(Xeomin is preservative free as she is allergic) or radiofrequency ablation. She is not interested in decompression therapy for the vascular loop seen  on MRI. She will discuss with husband and let us know.  Provided education on trigeminal neuralgia and migraines.Provided  extensive reading material and sites online for research and education.   Performed by Dr. Jaynee Eagles M.D. 58ml Lidocaine 2%  30-gauge needle was used. All procedures a documented blood were medically necessary, reasonable and appropriate based on the patient's history, medical diagnosis and physician opinion. Verbal informed consent was obtained from the patient, patient was informed of potential risk of procedure, including bruising, bleeding, hematoma formation, infection, muscle weakness, muscle pain, numbness, transient hypertension, transient hyperglycemia and transient insomnia among others. All areas injected were topically clean with isopropyl rubbing alcohol. Nonsterile nonlatex gloves were worn during the procedure.  1. Trigeminal nerve block (64400): Supraorbital nerve site was identified along the incision of the frontal bone on the orbital/supraorbital ridge. Medication was injected into the right supraorbital nerve areas. Patient's condition is associated with inflammation of the supraorbital and associated muscle groups. Injection was deemed medically necessary, reasonable and appropriate. Injection represents a separate and unique surgical service.  5. Cervical Trigger Point Injections: bilateral trapezius and levator scapulae (4 muscles) 50539  Sarina Ill, MD  [facility] [address] [place], [place] [address]  Phone [phone] Fax [phone]  A total of 25 minutes was spent face-to-face with this patient. Over half this time was spent on counseling patient on the Trigeminal neuralgia, myofascial cervical pain diagnosis and different diagnostic and therapeutic options available. This does not include time spent on nerve blocks

## 2017-02-18 NOTE — Patient Instructions (Addendum)
Magnesium citrate 400mg  to 600mg  daily, riboflavin 400mg , Coenzyme Q 10 100mg  three times daily Physical Therapy for Dry Needling of neck muscles Nerve blocks today, consider Botox Continue Cephaly Consider trying Trileptal

## 2017-02-20 ENCOUNTER — Telehealth: Payer: Self-pay | Admitting: Neurology

## 2017-02-20 NOTE — Telephone Encounter (Signed)
Please prescribe 2 sprays Intranasal lidocaine 4% into the nostril on the side of the headache, or bilaterally for bilateral headache every 15 minutes as needed for head pain Dispense one with 12 refills

## 2017-02-25 NOTE — Telephone Encounter (Signed)
Called in prescription per Dr. Jaynee Eagles instructions to [facility] for Intranasal Lidocaine 4% , 2 sprays into the nostril on the side of the headache or bilateral for bilateral headache every 15 minutes as needed for headache. Dispense 1 with 12 refills.

## 2017-02-25 NOTE — Telephone Encounter (Signed)
[facility] [phone] called said the pt has come to pick up the medication. Per the pharmacy pt is aware it will not be filled today as they will have to get it ready. Please send RX  Thank you

## 2017-03-04 MED FILL — VENLAFAXINE HCL ER 37.5 MG: 37.5 | 90 days supply | Qty: 90 | Fill #0

## 2017-03-06 ENCOUNTER — Ambulatory Visit: Payer: 59 | Attending: Neurology | Admitting: Physical Therapy

## 2017-03-06 DIAGNOSIS — R293 Abnormal posture: Secondary | ICD-10-CM | POA: Diagnosis not present

## 2017-03-06 DIAGNOSIS — M62830 Muscle spasm of back: Secondary | ICD-10-CM | POA: Diagnosis not present

## 2017-03-06 DIAGNOSIS — M542 Cervicalgia: Secondary | ICD-10-CM

## 2017-03-06 NOTE — Patient Instructions (Signed)

## 2017-03-06 NOTE — Therapy (Signed)
[facility] [address], [place], [place], [address] Phone: [phone]   Fax:  [phone]  Physical Therapy Evaluation  Patient Details  Name: [name] MRN: [id number] Date of Birth: 1960/11/02 Referring Provider: Sarina Ill, MD   Encounter Date: 03/06/2017  PT End of Session - 03/06/17 0954    Visit Number  1    Number of Visits  13    Date for PT Re-Evaluation  04/17/17    Authorization Type  [facility]    Authorization Time Period  03/06/17 to 04/17/17    PT Start Time  0800    PT Stop Time  0846    PT Time Calculation (min)  46 min    Activity Tolerance  Patient tolerated treatment well;No increased pain    Behavior During Therapy  WFL for tasks assessed/performed       Past Medical History:  Diagnosis Date  . Adhesive capsulitis of shoulder   . Aortic aneurysm ([place])   . Cancer [facility]) 2008   Breast  . Congenital anomaly of aortic arch   . Labyrinthitis, unspecified   . Malignant neoplasm of breast (female), unspecified site   . Other B-complex deficiencies   . Other malaise and fatigue   . Unspecified hearing loss   . Unspecified hereditary and idiopathic peripheral neuropathy   . Unspecified tinnitus     Past Surgical History:  Procedure Laterality Date  . ABDOMINAL HYSTERECTOMY  04/2010   robotic  . BASAL CELL CARCINOMA EXCISION  2003   rt eye area  . BREAST FIBROADENOMA SURGERY  1981   Left  . BREAST LUMPECTOMY Right Oct.29 & Mar 19, 2007   Right  . IR GENERIC HISTORICAL  04/16/2016   IR RADIOLOGIST EVAL & MGMT 04/16/2016 [facility]  . LAPAROSCOPY  1982   for endometriosis  . wisdom teeth extracted      There were no vitals filed for this visit.   Subjective Assessment - 03/06/17 0806    Subjective  Pt reports that her tinnitus in her Rt ear and trigeminal neuralgia was onset ~9 years ago with her chemo treatment. Over the past 2 years she noticed increasing  stiffness in her upper trap/neck muscles. She feels that the pain is pretty much constant, but she did have instant relief with the lidocane injections she received at Dr. Ferdinand Lango office.     Pertinent History  breast cancer in the past     Patient Stated Goals  decrease the stiffness and pain in her neck muscles     Currently in Pain?  Yes    Pain Score  6     Pain Location  Other (Comment) upper trap    upper trap    Pain Orientation  Right;Left    Pain Descriptors / Indicators  Aching;Spasm    Pain Type  Chronic pain    Pain Onset  More than a month ago    Pain Frequency  Constant    Aggravating Factors   always there    Pain Relieving Factors  heat, medications/supplements provided by MD         [facility] PT Assessment - 03/06/17 0001      Assessment   Referring Provider  Sarina Ill, MD    Onset Date/Surgical Date  -- ~9 years ago   ~9 years ago   Prior Therapy  seeing massage therapist       Precautions   Precautions  None  Balance Screen   Has the patient fallen in the past 6 months  No    Has the patient had a decrease in activity level because of a fear of falling?   No    Is the patient reluctant to leave their home because of a fear of falling?   No      Home Film/video editor residence      Prior Function   Level of Independence  Independent    Vocation Requirements  Hair dresser       Cognition   Overall Cognitive Status  Within Functional Limits for tasks assessed      Observation/Other Assessments   Focus on Therapeutic Outcomes (FOTO)   29% limited       ROM / Strength   AROM / PROM / Strength  AROM;Strength      AROM   AROM Assessment Site  Cervical    Cervical Flexion  WNL    Cervical Extension  WNL    Cervical - Right Side Bend  25 deg full passive   full passive   Cervical - Left Side Bend  35 full passive    full passive    Cervical - Right Rotation  60    Cervical - Left Rotation  50      Strength   Overall  Strength Comments  BUE strength grossly 5/5 MMT    Strength Assessment Site  Cervical    Cervical Flexion  3+/5    Cervical - Right Side Bend  3+/5    Cervical - Left Side Bend  3+/5      Palpation   Palpation comment  tenderness along Lt and Rt upper trap, sub occipitals, cervical paraspinals             Objective measurements completed on examination: See above findings.      [facility] Adult PT Treatment/Exercise - 03/06/17 0001      Manual Therapy   Manual Therapy  Soft tissue mobilization    Soft tissue mobilization  STM Lt and Rt upper trap        Trigger Point Dry Needling - 03/06/17 1224    Consent Given?  Yes    Education Handout Provided  Yes    Muscles Treated Upper Body  Upper trapezius    Upper Trapezius Response  Twitch reponse elicited;Palpable increased muscle length Lt and Rt    Lt and Rt           PT Education - 03/06/17 0951    Education provided  Yes    Education Details  eval findings/POC; dry needling    Person(s) Educated  Patient    Methods  Explanation;Handout    Comprehension  Verbalized understanding       PT Short Term Goals - 03/06/17 1000      PT SHORT TERM GOAL #1   Title  Pt will demo consistency and independence with her HEP to improve cervical ROM and decrease pain throughout the day.     Time  3    Period  Weeks    Status  New    Target Date  03/27/17      PT SHORT TERM GOAL #2   Title  Pt will demo improved cervical lateral flexion ROM to atleast 45 deg each direction.     Time  3    Period  Weeks    Status  New      PT SHORT TERM  GOAL #3   Title  Pt will report atleast 25% reduction in her neck stiffness/pain from the start of PT to improve her activity tolerance.    Time  3    Period  Weeks    Status  New        PT Long Term Goals - 03/06/17 1151      PT LONG TERM GOAL #1   Title  Pt will demo improved cervical rotation to the Lt and Rt up to atleast 75 deg and without increase in pain, to improve her  ability to turn her head while driving.     Time  6    Period  Weeks    Status  New    Target Date  04/17/17      PT LONG TERM GOAL #2   Title  Pt will report atleast 75% improvement in cervical stiffness and pain, to improve her quality of life and ability to complete her work throughout the day.     Time  6    Period  Weeks    Status  New      PT LONG TERM GOAL #3   Title  Pt will demo improved deep cervical flexor strength and endurance to atleast 4/5 MMT to increase her ability to maintain proper cervical alignment throughout the day.     Time  6    Period  Weeks    Status  New      PT LONG TERM GOAL #4   Title  Pt will demo consistency and independence with her advanced HEP to allow her to maintain her flexibility and strength with discharge.     Time  6    Period  Weeks    Status  New             Plan - 03/06/17 1205    Clinical Impression Statement  Pt is a pleasant 56 y.o F referred to OPPT with chronic cervical stiffness/pain. She has an additional diagnosis of trigeminal neuralgia and tinnitus which has had some improvements with lidocaine injections in the past. She presents today with limitations in cervical rotation/lateral flexion, decreased deep cervical flexor endurance/strength, as well as palpable tenderness and muscle spasm along the sub occipitals and upper trap region. Pt has reportedly had dry needling in the past which she did not feel was very helpful, however she was open to trying it again today. Therapist educated pt on the implications/physiology/side effects and completed that in addition to other manual techniques to address muscle spasm in the upper traps. Noted good twitch response, but was unable to complete treatment on the Rt due to time. Pt reported significant improvements in stiffness following this treatment. She would benefit from skilled PT to address limitations in ROM, strength, flexibility and decrease pain with daily activity.     History  and Personal Factors relevant to plan of care:  trigeminal neuralgia    Clinical Presentation  Stable    Clinical Presentation due to:  not changing since onset     Clinical Decision Making  Low    Rehab Potential  Good    PT Frequency  2x / week    PT Duration  6 weeks    PT Treatment/Interventions  ADLs/Self Care Home Management;Cryotherapy;Electrical Stimulation;Moist Heat;Traction;Therapeutic exercise;Therapeutic activities;Neuromuscular re-education;Patient/family education;Manual techniques;Passive range of motion;Dry needling;Taping    PT Next Visit Plan  follow up on DN to upper traps; possible DN to sub occipitals/SCM; cervical strengthening, postural strengthening, manual techniques  and modalities as needed    PT Home Exercise Plan  provide next session     Consulted and Agree with Plan of Care  Patient       Patient will benefit from skilled therapeutic intervention in order to improve the following deficits and impairments:  Decreased activity tolerance, Impaired flexibility, Decreased strength, Decreased range of motion, Increased muscle spasms, Postural dysfunction, Improper body mechanics, Pain  Visit Diagnosis: Cervicalgia  Muscle spasm of back  Abnormal posture     Problem List Patient Active Problem List   Diagnosis Date Noted  . Trigeminal neuralgia 09/30/2016  . Migraine with aura and without status migrainosus 09/30/2016  . Neck pain 09/17/2016  . Ascending aortic aneurysm ([place]) 05/15/2016  . Abnormal MRA, brain 05/15/2016  . Sensorineural hearing loss (SNHL) of right ear 05/15/2016  . 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 Dyspnea 12/14/2014  . Seasonal and perennial allergic rhinitis 12/14/2014  . Genetic testing 12/08/2014  . Malignant neoplasm of lower-outer quadrant of right breast of female, estrogen receptor positive ([place]) 08/23/2014  . Loss of transverse plantar arch 07/16/2014  . Elevated cholesterol 11/28/2010  . Depression with anxiety  11/28/2010  . Hereditary and idiopathic peripheral neuropathy 01/10/2010  . Tinnitus 01/10/2010  . HEARING LOSS, BILATERAL 01/10/2010  . Vitamin B12 deficiency 01/09/2010  . Fatigue 07/14/2008  . History of skin cancer 05/26/2007   12:27 PM,03/06/17 Elly Modena PT, DPT [place] [facility] [place]  [facility] [address] [place], [place], [address] Phone: [phone]   Fax:  [phone]  Name: ALISANDRA SON MRN: [id number] Date of Birth: July 28, 1960

## 2017-03-07 ENCOUNTER — Ambulatory Visit: Payer: 59

## 2017-03-07 DIAGNOSIS — M542 Cervicalgia: Secondary | ICD-10-CM | POA: Diagnosis not present

## 2017-03-07 DIAGNOSIS — R293 Abnormal posture: Secondary | ICD-10-CM

## 2017-03-07 DIAGNOSIS — M62830 Muscle spasm of back: Secondary | ICD-10-CM

## 2017-03-07 NOTE — Therapy (Signed)
[facility] [address], [place], [place], [address] Phone: [phone]   Fax:  [phone]  Physical Therapy Treatment  Patient Details  Name: [name] MRN: [id number] Date of Birth: 1961-04-19 Referring Provider: Sarina Ill, MD   Encounter Date: 03/07/2017  PT End of Session - 03/07/17 0856    Visit Number  2    Date for PT Re-Evaluation  04/17/17    Authorization Type  [employer] [facility]    Authorization Time Period  03/06/17 to 04/17/17    PT Start Time  0800    PT Stop Time  0900    PT Time Calculation (min)  60 min    Activity Tolerance  Patient tolerated treatment well    Behavior During Therapy  [facility] for tasks assessed/performed       Past Medical History:  Diagnosis Date  . Adhesive capsulitis of shoulder   . Aortic aneurysm ([place])   . Cancer [facility]) 2008   Breast  . Congenital anomaly of aortic arch   . Labyrinthitis, unspecified   . Malignant neoplasm of breast (female), unspecified site   . Other B-complex deficiencies   . Other malaise and fatigue   . Unspecified hearing loss   . Unspecified hereditary and idiopathic peripheral neuropathy   . Unspecified tinnitus     Past Surgical History:  Procedure Laterality Date  . ABDOMINAL HYSTERECTOMY  04/2010   robotic  . BASAL CELL CARCINOMA EXCISION  2003   rt eye area  . BREAST FIBROADENOMA SURGERY  1981   Left  . BREAST LUMPECTOMY Right Oct.29 & Mar 19, 2007   Right  . IR GENERIC HISTORICAL  04/16/2016   IR RADIOLOGIST EVAL & MGMT 04/16/2016 [facility] RAD  . LAPAROSCOPY  1982   for endometriosis  . wisdom teeth extracted      There were no vitals filed for this visit.  Subjective Assessment - 03/07/17 0803    Subjective  I was a little sore after dry needling yesterday.      Pertinent History  breast cancer in the past- no arm bike or [place]    Currently in Pain?  Yes    Pain Score  4     Pain Location  Neck    Pain Orientation   Right;Left                      [facility] Adult PT Treatment/Exercise - 03/07/17 0001      Exercises   Exercises  Neck      Neck Exercises: Theraband   Rows  20 reps;Red    Other Theraband Exercises  shoulder extension: red 2x10      Modalities   Modalities  Moist Heat      Moist Heat Therapy   Number Minutes Moist Heat  12 Minutes    Moist Heat Location  Cervical      Manual Therapy   Manual Therapy  Soft tissue mobilization    Soft tissue mobilization  soft tissue elongation to bil neck and suboccipitals.        Neck Exercises: Stretches   Upper Trapezius Stretch  3 reps;20 seconds    Neck Stretch  3 reps;20 seconds rotation and sidebending   rotation and sidebending      Trigger Point Dry Needling - 03/07/17 0809    Consent Given?  Yes    Muscles Treated Upper Body  Suboccipitals muscle group cervical multifidi bil   cervical  multifidi bil          PT Education - 03/07/17 0811    Education provided  Yes    Education Details  cervical A/ROM, posture, scapular strength in standing    Person(s) Educated  Patient    Methods  Explanation;Handout;Demonstration    Comprehension  Verbalized understanding;Returned demonstration       PT Short Term Goals - 03/06/17 1000      PT SHORT TERM GOAL #1   Title  Pt will demo consistency and independence with her HEP to improve cervical ROM and decrease pain throughout the day.     Time  3    Period  Weeks    Status  New    Target Date  03/27/17      PT SHORT TERM GOAL #2   Title  Pt will demo improved cervical lateral flexion ROM to atleast 45 deg each direction.     Time  3    Period  Weeks    Status  New      PT SHORT TERM GOAL #3   Title  Pt will report atleast 25% reduction in her neck stiffness/pain from the start of PT to improve her activity tolerance.    Time  3    Period  Weeks    Status  New        PT Long Term Goals - 03/06/17 1151      PT LONG TERM GOAL #1   Title  Pt will demo  improved cervical rotation to the Lt and Rt up to atleast 75 deg and without increase in pain, to improve her ability to turn her head while driving.     Time  6    Period  Weeks    Status  New    Target Date  04/17/17      PT LONG TERM GOAL #2   Title  Pt will report atleast 75% improvement in cervical stiffness and pain, to improve her quality of life and ability to complete her work throughout the day.     Time  6    Period  Weeks    Status  New      PT LONG TERM GOAL #3   Title  Pt will demo improved deep cervical flexor strength and endurance to atleast 4/5 MMT to increase her ability to maintain proper cervical alignment throughout the day.     Time  6    Period  Weeks    Status  New      PT LONG TERM GOAL #4   Title  Pt will demo consistency and independence with her advanced HEP to allow her to maintain her flexibility and strength with discharge.     Time  6    Period  Weeks    Status  New            Plan - 03/07/17 0813    Clinical Impression Statement  PT initiated HEP for cervical flexibility and scapular strength and educated regarding posture and importance with work tasks.  Pt with tension and trigger points in the neck and bil upper traps.  Pt with improved mobility after dry needling to cervical multifidi today.  Pt will continue to benefit from skilled PT for neck flexibility, postural strength, dry needling and manual for pain.      Rehab Potential  Good    PT Frequency  2x / week    PT Duration  6 weeks  PT Treatment/Interventions  ADLs/Self Care Home Management;Cryotherapy;Electrical Stimulation;Moist Heat;Traction;Therapeutic exercise;Therapeutic activities;Neuromuscular re-education;Patient/family education;Manual techniques;Passive range of motion;Dry needling;Taping    PT Next Visit Plan  assess response to dry needling, postural strength and flexibility, manual    Consulted and Agree with Plan of Care  Patient       Patient will benefit from  skilled therapeutic intervention in order to improve the following deficits and impairments:  Decreased activity tolerance, Impaired flexibility, Decreased strength, Decreased range of motion, Increased muscle spasms, Postural dysfunction, Improper body mechanics, Pain  Visit Diagnosis: Cervicalgia  Muscle spasm of back  Abnormal posture     Problem List Patient Active Problem List   Diagnosis Date Noted  . Trigeminal neuralgia 09/30/2016  . Migraine with aura and without status migrainosus 09/30/2016  . Neck pain 09/17/2016  . Ascending aortic aneurysm ([place]) 05/15/2016  . Abnormal MRA, brain 05/15/2016  . Sensorineural hearing loss (SNHL) of right ear 05/15/2016  . 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 Dyspnea 12/14/2014  . Seasonal and perennial allergic rhinitis 12/14/2014  . Genetic testing 12/08/2014  . Malignant neoplasm of lower-outer quadrant of right breast of female, estrogen receptor positive ([place]) 08/23/2014  . Loss of transverse plantar arch 07/16/2014  . Elevated cholesterol 11/28/2010  . Depression with anxiety 11/28/2010  . Hereditary and idiopathic peripheral neuropathy 01/10/2010  . Tinnitus 01/10/2010  . HEARING LOSS, BILATERAL 01/10/2010  . Vitamin B12 deficiency 01/09/2010  . Fatigue 07/14/2008  . History of skin cancer 05/26/2007    Sigurd Sos, PT 03/07/17 8:59 AM  [facility] [address] [place], [place], [address] Phone: [phone]   Fax:  [phone]  Name: HENLI HEY MRN: [id number] Date of Birth: 29-Jul-1960

## 2017-03-07 NOTE — Patient Instructions (Addendum)
PERFORM ALL EXERCISES GENTLY AND WITH GOOD POSTURE.    20 SECOND HOLD, 3 REPS TO EACH SIDE. 4-5 TIMES EACH DAY.   AROM: Neck Rotation   Turn head slowly to look over one shoulder, then the other.   AROM: Neck Flexion   Bend head forward.   AROM: Lateral Neck Flexion   Slowly tilt head toward one shoulder, then the other.   KEEP HEAD IN NEUTRAL AND SHOULDERS DOWN AND RELAXED   Hold tubing in right hand, arm forward. Pull arm back, elbow straight. Repeat __10__ times per set. Do __2__ sets per session. Do _1-2___ sessions per day.  Copyright  VHI. All rights reserved.     With resistive band anchored in door, grasp both ends. Keeping elbows bent, pull back, squeezing shoulder blades together. Hold _3__ seconds. Repeat _2x10___ times. Do _1-2___ sessions per day.  http://[email or web address].[email or web address]    Posture - Standing   Good posture is important. Avoid slouching and forward head thrust. Maintain curve in low back and align ears over shoulders, hips over ankles.  Pull your belly button in toward your back bone. Posture Tips DO: - stand tall and erect - keep chin tucked in - keep head and shoulders in alignment - check posture regularly in mirror or large window - pull head back against headrest in car seat;  Change your position often.  Sit with lumbar support. DON'T: - slouch or slump while watching TV or reading - sit, stand or lie in one position  for too long;  Sitting is especially hard on the spine so if you sit at a desk/use the computer, then stand up often! Copyright  VHI. All rights reserved.  Posture - Sitting  Sit upright, head facing forward. Try using a roll to support lower back. Keep shoulders relaxed, and avoid rounded back. Keep hips level with knees. Avoid crossing legs for long periods. Copyright  VHI. All rights reserved.  Chronic neck strain can develop because of poor posture and faulty work habits  Postural strain related to slumped sitting and forward  head posture is a leading cause of headaches, neck and upper back pain  General strengthening and flexibility exercises are helpful in the treatment of neck pain.  Most importantly, you should learn to correct the posture that may be contributing to chronic pain.   Change positions frequently  Change your work or home environment to improve posture and mechanics.   [facility] [address] [place], [place] [address] Phone # [phone] Fax [phone]

## 2017-03-11 ENCOUNTER — Ambulatory Visit: Payer: 59 | Admitting: Physical Therapy

## 2017-03-11 ENCOUNTER — Encounter: Payer: Self-pay | Admitting: Physical Therapy

## 2017-03-11 DIAGNOSIS — M62830 Muscle spasm of back: Secondary | ICD-10-CM

## 2017-03-11 DIAGNOSIS — M542 Cervicalgia: Secondary | ICD-10-CM | POA: Diagnosis not present

## 2017-03-11 DIAGNOSIS — R293 Abnormal posture: Secondary | ICD-10-CM

## 2017-03-11 NOTE — Therapy (Signed)
[facility] [address] [place], [place], [address] Phone: [phone]   Fax:  [phone]  Physical Therapy Treatment  Patient Details  Name: [name] MRN: [id number] Date of Birth: 12-Mar-1961 Referring Provider: Sarina Ill, MD   Encounter Date: 03/11/2017  PT End of Session - 03/11/17 0845    Visit Number  3    Number of Visits  13    Date for PT Re-Evaluation  04/17/17    Authorization Type  [facility]    Authorization Time Period  03/06/17 to 04/17/17    PT Start Time  0845    PT Stop Time  0945    PT Time Calculation (min)  60 min    Activity Tolerance  Patient tolerated treatment well    Behavior During Therapy  [facility] for tasks assessed/performed       Past Medical History:  Diagnosis Date  . Adhesive capsulitis of shoulder   . Aortic aneurysm ([place])   . Cancer [facility]) 2008   Breast  . Congenital anomaly of aortic arch   . Labyrinthitis, unspecified   . Malignant neoplasm of breast (female), unspecified site   . Other B-complex deficiencies   . Other malaise and fatigue   . Unspecified hearing loss   . Unspecified hereditary and idiopathic peripheral neuropathy   . Unspecified tinnitus     Past Surgical History:  Procedure Laterality Date  . ABDOMINAL HYSTERECTOMY  04/2010   robotic  . BASAL CELL CARCINOMA EXCISION  2003   rt eye area  . BREAST FIBROADENOMA SURGERY  1981   Left  . BREAST LUMPECTOMY Right Oct.29 & Mar 19, 2007   Right  . IR GENERIC HISTORICAL  04/16/2016   IR RADIOLOGIST EVAL & MGMT 04/16/2016 [facility]  . LAPAROSCOPY  1982   for endometriosis  . wisdom teeth extracted      There were no vitals filed for this visit.  Subjective Assessment - 03/11/17 0847    Subjective  I am sore today. Had a massage yesterday and that might have helped. Did not do HEP yesterday. I think the needling made me sore?    Pertinent History  breast cancer in the past- no arm  bike or [place]    Currently in Pain?  Yes    Pain Score  6  LT    Pain Location  -- Bil cervical, shoulders    Pain Descriptors / Indicators  Sore    Aggravating Factors   Work    Pain Relieving Factors  Heat, massage    Multiple Pain Sites  No                      [facility] Adult PT Treatment/Exercise - 03/11/17 0001      Self-Care   Self-Care  Posture    Posture  Including work duties/doing hair to reduce strain to her neck & shoulders      Neck Exercises: Supine   Other Supine Exercise  Decompresion series with shoulder stretch series:       Moist Heat Therapy   Number Minutes Moist Heat  15 Minutes    Moist Heat Location  Cervical      Electrical Stimulation   Electrical Stimulation Location  Lt scalenes    Electrical Stimulation Action  Pre mod 80-150 HZ    Electrical Stimulation Parameters  Supine    Electrical Stimulation Goals  Neuromuscular facilitation  Manual Therapy   Manual Therapy  Soft tissue mobilization    Soft tissue mobilization  soft tissue elongation to bil neck and suboccipitals.   Lt scalenes greater than RT             PT Education - 03/11/17 0928    Education provided  Yes    Education Details  How/where to purchase soft foam roll.     Person(s) Educated  Patient    Methods  Explanation;Demonstration;Verbal cues    Comprehension  Verbalized understanding;Returned demonstration       PT Short Term Goals - 03/06/17 1000      PT SHORT TERM GOAL #1   Title  Pt will demo consistency and independence with her HEP to improve cervical ROM and decrease pain throughout the day.     Time  3    Period  Weeks    Status  New    Target Date  03/27/17      PT SHORT TERM GOAL #2   Title  Pt will demo improved cervical lateral flexion ROM to atleast 45 deg each direction.     Time  3    Period  Weeks    Status  New      PT SHORT TERM GOAL #3   Title  Pt will report atleast 25% reduction in her neck stiffness/pain from the start of PT  to improve her activity tolerance.    Time  3    Period  Weeks    Status  New        PT Long Term Goals - 03/06/17 1151      PT LONG TERM GOAL #1   Title  Pt will demo improved cervical rotation to the Lt and Rt up to atleast 75 deg and without increase in pain, to improve her ability to turn her head while driving.     Time  6    Period  Weeks    Status  New    Target Date  04/17/17      PT LONG TERM GOAL #2   Title  Pt will report atleast 75% improvement in cervical stiffness and pain, to improve her quality of life and ability to complete her work throughout the day.     Time  6    Period  Weeks    Status  New      PT LONG TERM GOAL #3   Title  Pt will demo improved deep cervical flexor strength and endurance to atleast 4/5 MMT to increase her ability to maintain proper cervical alignment throughout the day.     Time  6    Period  Weeks    Status  New      PT LONG TERM GOAL #4   Title  Pt will demo consistency and independence with her advanced HEP to allow her to maintain her flexibility and strength with discharge.     Time  6    Period  Weeks    Status  New            Plan - 03/11/17 0845    Clinical Impression Statement  Pt continues to struggle with a feeling of "tighteness" thoughout her neck: Lt > RT and down the LTUE. Pt was educated in postures/tips to help her allieviate neck tension throughout  the day especially when doing hair. Pt reports Dn makes her sore, unsure she loves it. Instead, pt was shown how to use the soft foam  roll for general decompression, opening the front body and establishing better muscular  swarness at the mid back.     Rehab Potential  Good    PT Frequency  2x / week    PT Duration  6 weeks    PT Treatment/Interventions  ADLs/Self Care Home Management;Cryotherapy;Electrical Stimulation;Moist Heat;Traction;Therapeutic exercise;Therapeutic activities;Neuromuscular re-education;Patient/family education;Manual techniques;Passive range  of motion;Dry needling;Taping    PT Next Visit Plan  Assess pain, postural strength, show foam exs if she buys one.     Consulted and Agree with Plan of Care  --       Patient will benefit from skilled therapeutic intervention in order to improve the following deficits and impairments:  Decreased activity tolerance, Impaired flexibility, Decreased strength, Decreased range of motion, Increased muscle spasms, Postural dysfunction, Improper body mechanics, Pain  Visit Diagnosis: Cervicalgia  Muscle spasm of back  Abnormal posture     Problem List Patient Active Problem List   Diagnosis Date Noted  . Trigeminal neuralgia 09/30/2016  . Migraine with aura and without status migrainosus 09/30/2016  . Neck pain 09/17/2016  . Ascending aortic aneurysm ([place]) 05/15/2016  . Abnormal MRA, brain 05/15/2016  . Sensorineural hearing loss (SNHL) of right ear 05/15/2016  . 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 Dyspnea 12/14/2014  . Seasonal and perennial allergic rhinitis 12/14/2014  . Genetic testing 12/08/2014  . Malignant neoplasm of lower-outer quadrant of right breast of female, estrogen receptor positive ([place]) 08/23/2014  . Loss of transverse plantar arch 07/16/2014  . Elevated cholesterol 11/28/2010  . Depression with anxiety 11/28/2010  . Hereditary and idiopathic peripheral neuropathy 01/10/2010  . Tinnitus 01/10/2010  . HEARING LOSS, BILATERAL 01/10/2010  . Vitamin B12 deficiency 01/09/2010  . Fatigue 07/14/2008  . History of skin cancer 05/26/2007    Navaya Wiatrek, PTA 03/11/2017, 9:57 AM  [facility] [address] [place], [place], [address] Phone: [phone]   Fax:  [phone]  Name: [name] MRN: [id number] Date of Birth: 28-Jul-1960

## 2017-03-13 ENCOUNTER — Encounter: Payer: 59 | Admitting: Physical Therapy

## 2017-03-18 ENCOUNTER — Ambulatory Visit: Payer: 59 | Admitting: Physical Therapy

## 2017-03-18 DIAGNOSIS — M542 Cervicalgia: Secondary | ICD-10-CM | POA: Diagnosis not present

## 2017-03-18 DIAGNOSIS — M62830 Muscle spasm of back: Secondary | ICD-10-CM

## 2017-03-18 DIAGNOSIS — R293 Abnormal posture: Secondary | ICD-10-CM

## 2017-03-18 NOTE — Therapy (Signed)
[facility] [address] [place], [place], [address] Phone: [phone]   Fax:  [phone]  Physical Therapy Treatment  Patient Details  Name: [name] MRN: [id number] Date of Birth: [birth year] Referring Provider: Sarina Ill, MD   Encounter Date: 03/18/2017  PT End of Session - 03/18/17 1037    Visit Number  4    Number of Visits  13    Date for PT Re-Evaluation  04/17/17    Authorization Type  [employer] [facility]    Authorization Time Period  03/06/17 to 04/17/17    PT Start Time  1016    PT Stop Time  1104    PT Time Calculation (min)  48 min    Activity Tolerance  Patient tolerated treatment well;No increased pain    Behavior During Therapy  WFL for tasks assessed/performed       Past Medical History:  Diagnosis Date  . Adhesive capsulitis of shoulder   . Aortic aneurysm ([place])   . Cancer [facility]) 2008   Breast  . Congenital anomaly of aortic arch   . Labyrinthitis, unspecified   . Malignant neoplasm of breast (female), unspecified site   . Other B-complex deficiencies   . Other malaise and fatigue   . Unspecified hearing loss   . Unspecified hereditary and idiopathic peripheral neuropathy   . Unspecified tinnitus     Past Surgical History:  Procedure Laterality Date  . ABDOMINAL HYSTERECTOMY  04/2010   robotic  . BASAL CELL CARCINOMA EXCISION  2003   rt eye area  . BREAST FIBROADENOMA SURGERY  1981   Left  . BREAST LUMPECTOMY Right Oct.29 & Mar 19, 2007   Right  . IR GENERIC HISTORICAL  04/16/2016   IR RADIOLOGIST EVAL & MGMT 04/16/2016 [facility]  . LAPAROSCOPY  1982   for endometriosis  . wisdom teeth extracted      There were no vitals filed for this visit.  Subjective Assessment - 03/18/17 1018    Subjective  Pt reports that things are going well. She reports that the electric treatment and the massage really helped alot for even a couple of days. She is hoping to decrease how  often she shrugs her shoulders.     Pertinent History  breast cancer in the past- no arm bike or [place]    Currently in Pain?  Yes    Pain Score  4     Pain Location  -- Lt upper trap     Pain Orientation  Left    Pain Descriptors / Indicators  Aching;Dull    Pain Type  Chronic pain    Pain Radiating Towards  none     Pain Onset  More than a month ago    Pain Frequency  Intermittent    Aggravating Factors   work    Pain Relieving Factors  heat, massage                       [facility] Adult PT Treatment/Exercise - 03/18/17 0001      Neck Exercises: Supine   Shoulder ABduction  Both;20 reps;Other (comment) horizontal abduction with yellow band     Upper Extremity D2  Flexion;15 reps;Other (comment) yellow TB    Other Supine Exercise  chin tucks 15x3 sec hold       Manual Therapy   Manual Therapy  Taping    Soft tissue mobilization  STM Lt upper trap, sub occipital  release Rt>Lt    Myofascial Release  TPR during Rt lateral flexion     Kinesiotex  Facilitate Muscle      Kinesiotix   Facilitate Muscle   "V" strip along lower trap, "I" strip across middle trap to improve posture awareness              PT Education - 03/18/17 1034    Education provided  Yes    Education Details  implications for improving strength and activation of middle/lower trap to decrease upper trap spasm; care of kinesiotape/signs of irritation and how to remove tape, etc.     Person(s) Educated  Patient    Methods  Explanation;Handout    Comprehension  Verbalized understanding;Returned demonstration       PT Short Term Goals - 03/18/17 1130      PT SHORT TERM GOAL #1   Title  Pt will demo consistency and independence with her HEP to improve cervical ROM and decrease pain throughout the day.     Time  3    Period  Weeks    Status  Achieved      PT SHORT TERM GOAL #2   Title  Pt will demo improved cervical lateral flexion ROM to atleast 45 deg each direction.     Time  3    Period   Weeks    Status  On-going      PT SHORT TERM GOAL #3   Title  Pt will report atleast 25% reduction in her neck stiffness/pain from the start of PT to improve her activity tolerance.    Time  3    Period  Weeks    Status  On-going        PT Long Term Goals - 03/06/17 1151      PT LONG TERM GOAL #1   Title  Pt will demo improved cervical rotation to the Lt and Rt up to atleast 75 deg and without increase in pain, to improve her ability to turn her head while driving.     Time  6    Period  Weeks    Status  New    Target Date  04/17/17      PT LONG TERM GOAL #2   Title  Pt will report atleast 75% improvement in cervical stiffness and pain, to improve her quality of life and ability to complete her work throughout the day.     Time  6    Period  Weeks    Status  New      PT LONG TERM GOAL #3   Title  Pt will demo improved deep cervical flexor strength and endurance to atleast 4/5 MMT to increase her ability to maintain proper cervical alignment throughout the day.     Time  6    Period  Weeks    Status  New      PT LONG TERM GOAL #4   Title  Pt will demo consistency and independence with her advanced HEP to allow her to maintain her flexibility and strength with discharge.     Time  6    Period  Weeks    Status  New            Plan - 03/18/17 1118    Clinical Impression Statement  Pt arrived today reporting improved pain along the Rt upper trap, however she continues to note stiffness/pain along the Lt upper trap. She recently purchases a foam roll and has been  using this as instructed. Session focused on therex to improve lower trap strength/activation with pt able to complete with minimal cues for correction. Ended session with manual techniques and dry needling to Lt upper trap only, noting several twitch responses. Also applied kinesiotape to improve posture awareness throughout the day. End of session, pt reported 0/10 pain along the upper trap. Wil continue with  current POC.     Rehab Potential  Good    PT Frequency  2x / week    PT Duration  6 weeks    PT Treatment/Interventions  ADLs/Self Care Home Management;Cryotherapy;Electrical Stimulation;Moist Heat;Traction;Therapeutic exercise;Therapeutic activities;Neuromuscular re-education;Patient/family education;Manual techniques;Passive range of motion;Dry needling;Taping    PT Next Visit Plan  Dry needling Lt upper trap if needed; postural strength, show foam exs for pt to complete at home on her foam roll     PT Home Exercise Plan  added supine horizontal abduction (yellow TB)     Consulted and Agree with Plan of Care  Patient       Patient will benefit from skilled therapeutic intervention in order to improve the following deficits and impairments:  Decreased activity tolerance, Impaired flexibility, Decreased strength, Decreased range of motion, Increased muscle spasms, Postural dysfunction, Improper body mechanics, Pain  Visit Diagnosis: Muscle spasm of back  Abnormal posture  Cervicalgia     Problem List Patient Active Problem List   Diagnosis Date Noted  . Trigeminal neuralgia 09/30/2016  . Migraine with aura and without status migrainosus 09/30/2016  . Neck pain 09/17/2016  . Ascending aortic aneurysm ([place]) 05/15/2016  . Abnormal MRA, brain 05/15/2016  . Sensorineural hearing loss (SNHL) of right ear 05/15/2016  . 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 Dyspnea 12/14/2014  . Seasonal and perennial allergic rhinitis 12/14/2014  . Genetic testing 12/08/2014  . Malignant neoplasm of lower-outer quadrant of right breast of female, estrogen receptor positive ([place]) 08/23/2014  . Loss of transverse plantar arch 07/16/2014  . Elevated cholesterol 11/28/2010  . Depression with anxiety 11/28/2010  . Hereditary and idiopathic peripheral neuropathy 01/10/2010  . Tinnitus 01/10/2010  . HEARING LOSS, BILATERAL 01/10/2010  . Vitamin B12 deficiency 01/09/2010  . Fatigue 07/14/2008  .  History of skin cancer 05/26/2007    11:45 AM,03/18/17 Elly Modena PT, DPT [place] [facility] [place]  [facility] [address], [place], [place], [address] Phone: [phone]   Fax:  [phone]  Name: [name] MRN: [id number] Date of Birth: [birth year]

## 2017-03-18 NOTE — Patient Instructions (Addendum)
  Supine Horizontal Abduction  Lie on your back. Hold band up toward ceiling. Pull arms out to sides and gently allow shoulder blades to squeeze together.   x15 reps, 2x/day. Red band    [facility] [address], [place], [place] [address] Phone # [phone] Fax [phone]

## 2017-03-25 ENCOUNTER — Encounter: Payer: Self-pay | Admitting: Physical Therapy

## 2017-03-25 ENCOUNTER — Ambulatory Visit: Payer: 59 | Admitting: Physical Therapy

## 2017-03-25 DIAGNOSIS — M62830 Muscle spasm of back: Secondary | ICD-10-CM

## 2017-03-25 DIAGNOSIS — R293 Abnormal posture: Secondary | ICD-10-CM | POA: Diagnosis not present

## 2017-03-25 DIAGNOSIS — M542 Cervicalgia: Secondary | ICD-10-CM

## 2017-03-25 NOTE — Therapy (Signed)
[facility] [address] [place], [place], [address] Phone: [phone]   Fax:  [phone]  Physical Therapy Treatment  Patient Details  Name: [name] MRN: [id number] Date of Birth: [birth year] Referring Provider: Sarina Ill, MD   Encounter Date: 03/25/2017  PT End of Session - 03/25/17 0805    Visit Number  5    Number of Visits  13    Date for PT Re-Evaluation  04/17/17    Authorization Type  [employer] [facility]    Authorization Time Period  03/06/17 to 04/17/17    PT Start Time  0803    PT Stop Time  0922    PT Time Calculation (min)  79 min    Activity Tolerance  Patient tolerated treatment well;No increased pain    Behavior During Therapy  WFL for tasks assessed/performed       Past Medical History:  Diagnosis Date  . Adhesive capsulitis of shoulder   . Aortic aneurysm ([place])   . Cancer [facility]) 2008   Breast  . Congenital anomaly of aortic arch   . Labyrinthitis, unspecified   . Malignant neoplasm of breast (female), unspecified site   . Other B-complex deficiencies   . Other malaise and fatigue   . Unspecified hearing loss   . Unspecified hereditary and idiopathic peripheral neuropathy   . Unspecified tinnitus     Past Surgical History:  Procedure Laterality Date  . ABDOMINAL HYSTERECTOMY  04/2010   robotic  . BASAL CELL CARCINOMA EXCISION  2003   rt eye area  . BREAST FIBROADENOMA SURGERY  1981   Left  . BREAST LUMPECTOMY Right Oct.29 & Mar 19, 2007   Right  . IR GENERIC HISTORICAL  04/16/2016   IR RADIOLOGIST EVAL & MGMT 04/16/2016 [facility]  . LAPAROSCOPY  1982   for endometriosis  . wisdom teeth extracted      There were no vitals filed for this visit.  Subjective Assessment - 03/25/17 0807    Subjective  I am doing much better. N opain, but a sore muscle thing like I have been working out.     Currently in Pain?  No/denies    Multiple Pain Sites  No                       [facility] Adult PT Treatment/Exercise - 03/25/17 0001      Electrical Stimulation   Electrical Stimulation Location  Bil upper traps    Electrical Stimulation Action  Pre-Mod 80-150 HZ    Electrical Stimulation Parameters  supine    Electrical Stimulation Goals  Neuromuscular facilitation      Manual Therapy   Soft tissue mobilization  Bil SCM, occipitals, ptyergoid TPR throughout      Kinesiotix   Facilitate Muscle   "V" strip along lower trap, "I" strip across middle trap to improve posture awareness              PT Education - 03/25/17 0810    Education provided  Yes    Education Details  Home foam roll protocol    Person(s) Educated  Patient    Methods  Explanation;Demonstration;Tactile cues;Verbal cues    Comprehension  Verbalized understanding;Returned demonstration       PT Short Term Goals - 03/25/17 7654      PT SHORT TERM GOAL #3   Title  Pt will report atleast 25% reduction in her neck stiffness/pain from the start of  PT to improve her activity tolerance.    Time  3    Status  Achieved 40%        PT Long Term Goals - 03/06/17 1151      PT LONG TERM GOAL #1   Title  Pt will demo improved cervical rotation to the Lt and Rt up to atleast 75 deg and without increase in pain, to improve her ability to turn her head while driving.     Time  6    Period  Weeks    Status  New    Target Date  04/17/17      PT LONG TERM GOAL #2   Title  Pt will report atleast 75% improvement in cervical stiffness and pain, to improve her quality of life and ability to complete her work throughout the day.     Time  6    Period  Weeks    Status  New      PT LONG TERM GOAL #3   Title  Pt will demo improved deep cervical flexor strength and endurance to atleast 4/5 MMT to increase her ability to maintain proper cervical alignment throughout the day.     Time  6    Period  Weeks    Status  New      PT LONG TERM GOAL #4   Title  Pt will demo  consistency and independence with her advanced HEP to allow her to maintain her flexibility and strength with discharge.     Time  6    Period  Weeks    Status  New            Plan - 03/25/17 0806    Clinical Impression Statement  Overall pt reports today she is 40% improved since the first day of PT. She is currently trying to use her soft foam roll at home for shoulder and mid back stretches/release work. When we tried to add cervical release methods on the roll this provoked nausea and Rt sideded facial numbness. This also increased by the end of the manual work. Pt was encouraged to hold on any further stretching/provocation of these nerve symptoms until she feels back at her baseline. She verbally agreed.       Rehab Potential  Good    PT Frequency  2x / week    PT Duration  6 weeks    PT Treatment/Interventions  ADLs/Self Care Home Management;Cryotherapy;Electrical Stimulation;Moist Heat;Traction;Therapeutic exercise;Therapeutic activities;Neuromuscular re-education;Patient/family education;Manual techniques;Passive range of motion;Dry needling;Taping    Consulted and Agree with Plan of Care  Patient       Patient will benefit from skilled therapeutic intervention in order to improve the following deficits and impairments:  Decreased activity tolerance, Impaired flexibility, Decreased strength, Decreased range of motion, Increased muscle spasms, Postural dysfunction, Improper body mechanics, Pain  Visit Diagnosis: Muscle spasm of back  Abnormal posture  Cervicalgia     Problem List Patient Active Problem List   Diagnosis Date Noted  . Trigeminal neuralgia 09/30/2016  . Migraine with aura and without status migrainosus 09/30/2016  . Neck pain 09/17/2016  . Ascending aortic aneurysm ([place]) 05/15/2016  . Abnormal MRA, brain 05/15/2016  . Sensorineural hearing loss (SNHL) of right ear 05/15/2016  . 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 Dyspnea 12/14/2014  . Seasonal  and perennial allergic rhinitis 12/14/2014  . Genetic testing 12/08/2014  . Malignant neoplasm of lower-outer quadrant of right breast of female, estrogen receptor positive ([place]) 08/23/2014  .  Loss of transverse plantar arch 07/16/2014  . Elevated cholesterol 11/28/2010  . Depression with anxiety 11/28/2010  . Hereditary and idiopathic peripheral neuropathy 01/10/2010  . Tinnitus 01/10/2010  . HEARING LOSS, BILATERAL 01/10/2010  . Vitamin B12 deficiency 01/09/2010  . Fatigue 07/14/2008  . History of skin cancer 05/26/2007    Kanan Sobek, PTA 03/25/2017, 9:28 AM  [facility] [address] [place], [place], [address] Phone: [phone]   Fax:  [phone]  Name: YAEL COPPESS MRN: [id number] Date of Birth: [birth year]

## 2017-03-27 ENCOUNTER — Ambulatory Visit: Payer: 59

## 2017-03-27 DIAGNOSIS — M542 Cervicalgia: Secondary | ICD-10-CM | POA: Diagnosis not present

## 2017-03-27 DIAGNOSIS — R293 Abnormal posture: Secondary | ICD-10-CM | POA: Diagnosis not present

## 2017-03-27 DIAGNOSIS — M62830 Muscle spasm of back: Secondary | ICD-10-CM

## 2017-03-27 NOTE — Therapy (Signed)
[facility] [address] [place], [place], [address] Phone: [phone]   Fax:  [phone]  Physical Therapy Treatment  Patient Details  Name: [name] MRN: [id number] Date of Birth: 11-12-1960 Referring Provider: Sarina Ill, MD   Encounter Date: 03/27/2017  PT End of Session - 03/27/17 0848    Visit Number  6    Date for PT Re-Evaluation  04/17/17    Authorization Type  [employer] [facility]    PT Start Time  0801    PT Stop Time  4854    PT Time Calculation (min)  54 min    Activity Tolerance  Patient tolerated treatment well;No increased pain    Behavior During Therapy  WFL for tasks assessed/performed       Past Medical History:  Diagnosis Date  . Adhesive capsulitis of shoulder   . Aortic aneurysm ([place])   . Cancer [facility]) 2008   Breast  . Congenital anomaly of aortic arch   . Labyrinthitis, unspecified   . Malignant neoplasm of breast (female), unspecified site   . Other B-complex deficiencies   . Other malaise and fatigue   . Unspecified hearing loss   . Unspecified hereditary and idiopathic peripheral neuropathy   . Unspecified tinnitus     Past Surgical History:  Procedure Laterality Date  . ABDOMINAL HYSTERECTOMY  04/2010   robotic  . BASAL CELL CARCINOMA EXCISION  2003   rt eye area  . BREAST FIBROADENOMA SURGERY  1981   Left  . BREAST LUMPECTOMY Right Oct.29 & Mar 19, 2007   Right  . IR GENERIC HISTORICAL  04/16/2016   IR RADIOLOGIST EVAL & MGMT 04/16/2016 [facility]  . LAPAROSCOPY  1982   for endometriosis  . wisdom teeth extracted      There were no vitals filed for this visit.  Subjective Assessment - 03/27/17 0806    Subjective  I am feeling better today.  Lt upper trap is sore.  Tape helped me to relax my upper traps with sleep.      Currently in Pain?  Yes    Pain Score  3     Pain Location  Neck    Pain Orientation  Left    Pain Descriptors / Indicators   Aching;Tightness         [facility] PT Assessment - 03/27/17 0001      AROM   Cervical - Right Side Bend  45    Cervical - Left Side Bend  50                  [facility] Adult PT Treatment/Exercise - 03/27/17 0001      Moist Heat Therapy   Number Minutes Moist Heat  12 Minutes    Moist Heat Location  Cervical      Manual Therapy   Soft tissue mobilization  soft tissue elongation to bil upper trap, sub occipital release Rt>Lt      Kinesiotix   Facilitate Muscle   --      Neck Exercises: Stretches   Other Neck Stretches  cervical A/ROM in all directions 3x20 seconds       Trigger Point Dry Needling - 03/27/17 0807    Consent Given?  Yes    Muscles Treated Upper Body  Suboccipitals muscle group;Upper trapezius;Oblique capitus    Upper Trapezius Response  Twitch reponse elicited;Palpable increased muscle length Lt only, could not tolerate on Rt    Oblique Capitus Response  Twitch response elicited;Palpable increased muscle length Lt only- had to stop- electrical shock    SubOccipitals Response  --             PT Short Term Goals - 03/27/17 0809      PT SHORT TERM GOAL #1   Title  Pt will demo consistency and independence with her HEP to improve cervical ROM and decrease pain throughout the day.     Status  Achieved      PT SHORT TERM GOAL #2   Title  Pt will demo improved cervical lateral flexion ROM to atleast 45 deg each direction.     Status  Achieved      PT SHORT TERM GOAL #3   Title  Pt will report atleast 25% reduction in her neck stiffness/pain from the start of PT to improve her activity tolerance.    Status  Achieved        PT Long Term Goals - 03/06/17 1151      PT LONG TERM GOAL #1   Title  Pt will demo improved cervical rotation to the Lt and Rt up to atleast 75 deg and without increase in pain, to improve her ability to turn her head while driving.     Time  6    Period  Weeks    Status  New    Target Date  04/17/17      PT LONG TERM  GOAL #2   Title  Pt will report atleast 75% improvement in cervical stiffness and pain, to improve her quality of life and ability to complete her work throughout the day.     Time  6    Period  Weeks    Status  New      PT LONG TERM GOAL #3   Title  Pt will demo improved deep cervical flexor strength and endurance to atleast 4/5 MMT to increase her ability to maintain proper cervical alignment throughout the day.     Time  6    Period  Weeks    Status  New      PT LONG TERM GOAL #4   Title  Pt will demo consistency and independence with her advanced HEP to allow her to maintain her flexibility and strength with discharge.     Time  6    Period  Weeks    Status  New            Plan - 03/27/17 0960    Clinical Impression Statement  Pt reports 40% overall improvement in symptoms since the start of care.  Pt with migraine symptoms/Rt facial numbness after last session.  Pt with trigger points in bil upper traps, cervical paraspinals. Pt was hypersensitive to dry needling today and not able to tolerate much today.  Pt reported "electrical shock" with all attempts.  Pt with increased cervical sidebending bilaterally.  Pt will continue to benefit from skilled PT for manual, cervical flexibility, postural strength and modalities as needed.      Rehab Potential  Good    PT Frequency  2x / week    PT Duration  6 weeks    PT Treatment/Interventions  ADLs/Self Care Home Management;Cryotherapy;Electrical Stimulation;Moist Heat;Traction;Therapeutic exercise;Therapeutic activities;Neuromuscular re-education;Patient/family education;Manual techniques;Passive range of motion;Dry needling;Taping    PT Next Visit Plan  assess response to dry needling, dry needling to upper traps and cervcial spine as tolerated,  manual as needed, foam roll exercise    Recommended Other Services  initial  certification is signed    Consulted and Agree with Plan of Care  Patient       Patient will benefit from  skilled therapeutic intervention in order to improve the following deficits and impairments:  Decreased activity tolerance, Impaired flexibility, Decreased strength, Decreased range of motion, Increased muscle spasms, Postural dysfunction, Improper body mechanics, Pain  Visit Diagnosis: Muscle spasm of back  Abnormal posture  Cervicalgia     Problem List Patient Active Problem List   Diagnosis Date Noted  . Trigeminal neuralgia 09/30/2016  . Migraine with aura and without status migrainosus 09/30/2016  . Neck pain 09/17/2016  . Ascending aortic aneurysm ([place]) 05/15/2016  . Abnormal MRA, brain 05/15/2016  . Sensorineural hearing loss (SNHL) of right ear 05/15/2016  . 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 Dyspnea 12/14/2014  . Seasonal and perennial allergic rhinitis 12/14/2014  . Genetic testing 12/08/2014  . Malignant neoplasm of lower-outer quadrant of right breast of female, estrogen receptor positive ([place]) 08/23/2014  . Loss of transverse plantar arch 07/16/2014  . Elevated cholesterol 11/28/2010  . Depression with anxiety 11/28/2010  . Hereditary and idiopathic peripheral neuropathy 01/10/2010  . Tinnitus 01/10/2010  . HEARING LOSS, BILATERAL 01/10/2010  . Vitamin B12 deficiency 01/09/2010  . Fatigue 07/14/2008  . History of skin cancer 05/26/2007    Sigurd Sos, PT 03/27/17 8:53 AM  [facility] [address], [place], [place], [address] Phone: [phone]   Fax:  [phone]  Name: [name] MRN: [id number] Date of Birth: October 02, 1960

## 2017-04-01 ENCOUNTER — Encounter: Payer: Self-pay | Admitting: Physical Therapy

## 2017-04-01 ENCOUNTER — Ambulatory Visit: Payer: 59 | Attending: Neurology | Admitting: Physical Therapy

## 2017-04-01 DIAGNOSIS — M62830 Muscle spasm of back: Secondary | ICD-10-CM

## 2017-04-01 DIAGNOSIS — R293 Abnormal posture: Secondary | ICD-10-CM | POA: Diagnosis not present

## 2017-04-01 DIAGNOSIS — M542 Cervicalgia: Secondary | ICD-10-CM | POA: Diagnosis not present

## 2017-04-01 NOTE — Therapy (Signed)
[facility] [address] [place], [place], [address] Phone: [phone]   Fax:  [phone]  Physical Therapy Treatment  Patient Details  Name: [name] MRN: [id number] Date of Birth: 1960/05/17 Referring Provider: Sarina Ill, MD   Encounter Date: 04/01/2017  PT End of Session - 04/01/17 0822    Visit Number  7    Number of Visits  13    Date for PT Re-Evaluation  04/17/17    Authorization Type  [facility]    Authorization Time Period  03/06/17 to 04/17/17    PT Start Time  0803    PT Stop Time  0903    PT Time Calculation (min)  60 min    Activity Tolerance  Patient tolerated treatment well;No increased pain    Behavior During Therapy  WFL for tasks assessed/performed       Past Medical History:  Diagnosis Date  . Adhesive capsulitis of shoulder   . Aortic aneurysm ([place])   . Cancer [facility]) 2008   Breast  . Congenital anomaly of aortic arch   . Labyrinthitis, unspecified   . Malignant neoplasm of breast (female), unspecified site   . Other B-complex deficiencies   . Other malaise and fatigue   . Unspecified hearing loss   . Unspecified hereditary and idiopathic peripheral neuropathy   . Unspecified tinnitus     Past Surgical History:  Procedure Laterality Date  . ABDOMINAL HYSTERECTOMY  04/2010   robotic  . BASAL CELL CARCINOMA EXCISION  2003   rt eye area  . BREAST FIBROADENOMA SURGERY  1981   Left  . BREAST LUMPECTOMY Right Oct.29 & Mar 19, 2007   Right  . IR GENERIC HISTORICAL  04/16/2016   IR RADIOLOGIST EVAL & MGMT 04/16/2016 [facility]  . LAPAROSCOPY  1982   for endometriosis  . wisdom teeth extracted      There were no vitals filed for this visit.  Subjective Assessment - 04/01/17 0843    Subjective  I hurt on the LT side of my neck after the DN including full facial numbness for a few days. She reports minimal RT sided numbness today and "really tight" in neck, both  sides. I am pretty sore.     Currently in Pain?  Yes    Pain Score  5     Pain Location  Neck    Pain Orientation  Right;Left    Pain Descriptors / Indicators  Sore    Aggravating Factors   overdoing    Pain Relieving Factors  heat, TENS    Multiple Pain Sites  No                      [facility] Adult PT Treatment/Exercise - 04/01/17 0001      Electrical Stimulation   Electrical Stimulation Location  upper traps to lower traps    Electrical Stimulation Action  IFC 80-150 HZ    Electrical Stimulation Parameters  sub sensory    Electrical Stimulation Goals  Tone;Pain;Neuromuscular facilitation      Manual Therapy   Soft tissue mobilization  light soft tissue to bil SCM    Myofascial Release  feathering with ripple to Bil upper quadrants, downgrade technique with Rock blade to upper traps bil with upgrade techniques to scapular muscles    Kinesiotex  Inhibit Muscle      Kinesiotix   Inhibit Muscle   I strip to bil upper traps  PT Short Term Goals - 03/27/17 0809      PT SHORT TERM GOAL #1   Title  Pt will demo consistency and independence with her HEP to improve cervical ROM and decrease pain throughout the day.     Status  Achieved      PT SHORT TERM GOAL #2   Title  Pt will demo improved cervical lateral flexion ROM to atleast 45 deg each direction.     Status  Achieved      PT SHORT TERM GOAL #3   Title  Pt will report atleast 25% reduction in her neck stiffness/pain from the start of PT to improve her activity tolerance.    Status  Achieved        PT Long Term Goals - 03/06/17 1151      PT LONG TERM GOAL #1   Title  Pt will demo improved cervical rotation to the Lt and Rt up to atleast 75 deg and without increase in pain, to improve her ability to turn her head while driving.     Time  6    Period  Weeks    Status  New    Target Date  04/17/17      PT LONG TERM GOAL #2   Title  Pt will report atleast 75% improvement in cervical  stiffness and pain, to improve her quality of life and ability to complete her work throughout the day.     Time  6    Period  Weeks    Status  New      PT LONG TERM GOAL #3   Title  Pt will demo improved deep cervical flexor strength and endurance to atleast 4/5 MMT to increase her ability to maintain proper cervical alignment throughout the day.     Time  6    Period  Weeks    Status  New      PT LONG TERM GOAL #4   Title  Pt will demo consistency and independence with her advanced HEP to allow her to maintain her flexibility and strength with discharge.     Time  6    Period  Weeks    Status  New            Plan - 04/01/17 5009    Clinical Impression Statement  Pt had a few days of increased symptoms in her neck after last session of dry needling. Today we concentrated on sub sensorytechniques to not increase any symptoms neurologically and increase her neck rotation and side bend.  At end of session pt could move her neck farther but more notably with a greater ease than when we started the session. No HA, and    Rehab Potential  Good    PT Frequency  2x / week    PT Duration  6 weeks    PT Treatment/Interventions  ADLs/Self Care Home Management;Cryotherapy;Electrical Stimulation;Moist Heat;Traction;Therapeutic exercise;Therapeutic activities;Neuromuscular re-education;Patient/family education;Manual techniques;Passive range of motion;Dry needling;Taping    PT Next Visit Plan  Pt may want to try DN next week, not next session. Continue with light inhibatory techniques to her cervical and jaw muscles with some light strengthening to her mid back.     Consulted and Agree with Plan of Care  Patient       Patient will benefit from skilled therapeutic intervention in order to improve the following deficits and impairments:  Decreased activity tolerance, Impaired flexibility, Decreased strength, Decreased range of motion, Increased muscle spasms, Postural  dysfunction, Improper body  mechanics, Pain  Visit Diagnosis: Muscle spasm of back  Abnormal posture  Cervicalgia     Problem List Patient Active Problem List   Diagnosis Date Noted  . Trigeminal neuralgia 09/30/2016  . Migraine with aura and without status migrainosus 09/30/2016  . Neck pain 09/17/2016  . Ascending aortic aneurysm ([place]) 05/15/2016  . Abnormal MRA, brain 05/15/2016  . Sensorineural hearing loss (SNHL) of right ear 05/15/2016  . 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 Dyspnea 12/14/2014  . Seasonal and perennial allergic rhinitis 12/14/2014  . Genetic testing 12/08/2014  . Malignant neoplasm of lower-outer quadrant of right breast of female, estrogen receptor positive ([place]) 08/23/2014  . Loss of transverse plantar arch 07/16/2014  . Elevated cholesterol 11/28/2010  . Depression with anxiety 11/28/2010  . Hereditary and idiopathic peripheral neuropathy 01/10/2010  . Tinnitus 01/10/2010  . HEARING LOSS, BILATERAL 01/10/2010  . Vitamin B12 deficiency 01/09/2010  . Fatigue 07/14/2008  . History of skin cancer 05/26/2007    COCHRAN,JENNIFER, PTA 04/01/2017, 9:08 AM  [facility] [address], [place], [place], [address] Phone: [phone]   Fax:  [phone]  Name: [name] MRN: [id number] Date of Birth: Oct 29, 1960

## 2017-04-03 ENCOUNTER — Ambulatory Visit: Payer: 59

## 2017-04-03 DIAGNOSIS — R293 Abnormal posture: Secondary | ICD-10-CM

## 2017-04-03 DIAGNOSIS — M62830 Muscle spasm of back: Secondary | ICD-10-CM | POA: Diagnosis not present

## 2017-04-03 DIAGNOSIS — M542 Cervicalgia: Secondary | ICD-10-CM

## 2017-04-03 NOTE — Therapy (Signed)
[facility] [address], [place], [place], [address] Phone: [phone]   Fax:  [phone]  Physical Therapy Treatment  Patient Details  Name: [name] MRN: [id number] Date of Birth: [birth year] Referring Provider: Sarina Ill, MD   Encounter Date: 04/03/2017  PT End of Session - 04/03/17 0843    Visit Number  8    Date for PT Re-Evaluation  04/17/17    Authorization Type  [facility]    Authorization Time Period  03/06/17 to 04/17/17    PT Start Time  0801    PT Stop Time  7673    PT Time Calculation (min)  54 min    Activity Tolerance  Patient tolerated treatment well;No increased pain    Behavior During Therapy  WFL for tasks assessed/performed       Past Medical History:  Diagnosis Date  . Adhesive capsulitis of shoulder   . Aortic aneurysm ([place])   . Cancer [facility]) 2008   Breast  . Congenital anomaly of aortic arch   . Labyrinthitis, unspecified   . Malignant neoplasm of breast (female), unspecified site   . Other B-complex deficiencies   . Other malaise and fatigue   . Unspecified hearing loss   . Unspecified hereditary and idiopathic peripheral neuropathy   . Unspecified tinnitus     Past Surgical History:  Procedure Laterality Date  . ABDOMINAL HYSTERECTOMY  04/2010   robotic  . BASAL CELL CARCINOMA EXCISION  2003   rt eye area  . BREAST FIBROADENOMA SURGERY  1981   Left  . BREAST LUMPECTOMY Right Oct.29 & Mar 19, 2007   Right  . IR GENERIC HISTORICAL  04/16/2016   IR RADIOLOGIST EVAL & MGMT 04/16/2016 [facility]  . LAPAROSCOPY  1982   for endometriosis  . wisdom teeth extracted      There were no vitals filed for this visit.  Subjective Assessment - 04/03/17 0802    Subjective  I had pain for 4 days after dry needling last time.  I tried to do gentle mobility and work on my foam roll.      Currently in Pain?  Yes    Pain Score  4     Pain Location  Neck    Pain  Orientation  Right;Left    Pain Descriptors / Indicators  Sore    Pain Type  Chronic pain    Pain Radiating Towards  jaw area bilaterally-numbness    Pain Onset  More than a month ago    Pain Frequency  Intermittent         [facility] PT Assessment - 04/03/17 0001      AROM   Cervical - Right Rotation  60    Cervical - Left Rotation  20                  [facility] Adult PT Treatment/Exercise - 04/03/17 0001      Neck Exercises: Supine   Neck Retraction  10 reps;5 secs on foam roll    Other Supine Exercise  supine on foam roll:  green theraband horizontal abduction, D2, ER and overhead flexion 2x10      Moist Heat Therapy   Number Minutes Moist Heat  15 Minutes    Moist Heat Location  Cervical      Electrical Stimulation   Electrical Stimulation Location  upper traps to lower traps    Electrical Stimulation Action  IFC    Electrical  Stimulation Parameters  sub sensory    Electrical Stimulation Goals  Neuromuscular facilitation;Pain;Tone      Manual Therapy   Soft tissue mobilization  light soft tissue to bil upper traps and cervical paraspinals/suboccipitals      Neck Exercises: Stretches   Other Neck Stretches  cervical A/ROM in all directions x 10 each with short holds               PT Short Term Goals - 03/27/17 0809      PT SHORT TERM GOAL #1   Title  Pt will demo consistency and independence with her HEP to improve cervical ROM and decrease pain throughout the day.     Status  Achieved      PT SHORT TERM GOAL #2   Title  Pt will demo improved cervical lateral flexion ROM to atleast 45 deg each direction.     Status  Achieved      PT SHORT TERM GOAL #3   Title  Pt will report atleast 25% reduction in her neck stiffness/pain from the start of PT to improve her activity tolerance.    Status  Achieved        PT Long Term Goals - 04/03/17 3419      PT LONG TERM GOAL #1   Title  Pt will demo improved cervical rotation to the Lt and Rt up to atleast  75 deg and without increase in pain, to improve her ability to turn her head while driving.     Baseline  Rt limited to 60 degrees, Lt 70 degrees    Time  6    Period  Weeks    Status  On-going            Plan - 04/03/17 0820    Clinical Impression Statement  Pt has had variable symptoms over the past 2 weeks.  Pt is hypersensitive to manual techniques so this is performed with gentle technique.  Pt was able to tolerate use of green band with supine exercises on foam roll.  Pt with continued limited and painful cervical A/ROM and continues to have sensory changes in bil face.  Pt will continue to benefit from skilled PT for gentle strength, manual and modalities as tolerated.      Rehab Potential  Good    PT Frequency  2x / week    PT Duration  6 weeks    PT Treatment/Interventions  ADLs/Self Care Home Management;Cryotherapy;Electrical Stimulation;Moist Heat;Traction;Therapeutic exercise;Therapeutic activities;Neuromuscular re-education;Patient/family education;Manual techniques;Passive range of motion;Dry needling;Taping    PT Next Visit Plan  gentle progression of mobility, manual and modalities.  No dry needling for now as pt is too sensitive to this.      Consulted and Agree with Plan of Care  Patient       Patient will benefit from skilled therapeutic intervention in order to improve the following deficits and impairments:  Decreased activity tolerance, Impaired flexibility, Decreased strength, Decreased range of motion, Increased muscle spasms, Postural dysfunction, Improper body mechanics, Pain  Visit Diagnosis: Muscle spasm of back  Abnormal posture  Cervicalgia     Problem List Patient Active Problem List   Diagnosis Date Noted  . Trigeminal neuralgia 09/30/2016  . Migraine with aura and without status migrainosus 09/30/2016  . Neck pain 09/17/2016  . Ascending aortic aneurysm ([place]) 05/15/2016  . Abnormal MRA, brain 05/15/2016  . Sensorineural hearing loss (SNHL)  of right ear 05/15/2016  . 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 Dyspnea  12/14/2014  . Seasonal and perennial allergic rhinitis 12/14/2014  . Genetic testing 12/08/2014  . Malignant neoplasm of lower-outer quadrant of right breast of female, estrogen receptor positive ([place]) 08/23/2014  . Loss of transverse plantar arch 07/16/2014  . Elevated cholesterol 11/28/2010  . Depression with anxiety 11/28/2010  . Hereditary and idiopathic peripheral neuropathy 01/10/2010  . Tinnitus 01/10/2010  . HEARING LOSS, BILATERAL 01/10/2010  . Vitamin B12 deficiency 01/09/2010  . Fatigue 07/14/2008  . History of skin cancer 05/26/2007     Sigurd Sos, PT 04/03/17 8:47 AM  [facility] [address] [place], [place], [address] Phone: [phone]   Fax:  [phone]  Name: [name] MRN: [id number] Date of Birth: 11-10-1960

## 2017-04-08 ENCOUNTER — Encounter: Payer: 59 | Admitting: Physical Therapy

## 2017-04-18 ENCOUNTER — Ambulatory Visit: Payer: 59 | Admitting: Physical Therapy

## 2017-04-18 ENCOUNTER — Encounter: Payer: Self-pay | Admitting: Physical Therapy

## 2017-04-18 DIAGNOSIS — R293 Abnormal posture: Secondary | ICD-10-CM

## 2017-04-18 DIAGNOSIS — M62830 Muscle spasm of back: Secondary | ICD-10-CM

## 2017-04-18 DIAGNOSIS — M542 Cervicalgia: Secondary | ICD-10-CM | POA: Diagnosis not present

## 2017-04-18 NOTE — Therapy (Signed)
[facility] [address], [place], [place], [address] Phone: [phone]   Fax:  [phone]  Physical Therapy Treatment/Reassessment   Patient Details  Name: [name] MRN: [id number] Date of Birth: 09/24/1960 Referring Provider: Sarina Ill, MD   Encounter Date: 04/18/2017  PT End of Session - 04/18/17 0940    Visit Number  9    Date for PT Re-Evaluation  04/17/17    Authorization Type  [employer] [facility]    Authorization Time Period  03/06/17 to 04/17/17, NEW: 04/18/17 to 06/13/17    PT Start Time  0932    PT Stop Time  1012    PT Time Calculation (min)  40 min    Activity Tolerance  Patient tolerated treatment well;No increased pain    Behavior During Therapy  WFL for tasks assessed/performed       Past Medical History:  Diagnosis Date  . Adhesive capsulitis of shoulder   . Aortic aneurysm ([place])   . Cancer [facility]) 2008   Breast  . Congenital anomaly of aortic arch   . Labyrinthitis, unspecified   . Malignant neoplasm of breast (female), unspecified site   . Other B-complex deficiencies   . Other malaise and fatigue   . Unspecified hearing loss   . Unspecified hereditary and idiopathic peripheral neuropathy   . Unspecified tinnitus     Past Surgical History:  Procedure Laterality Date  . ABDOMINAL HYSTERECTOMY  04/2010   robotic  . BASAL CELL CARCINOMA EXCISION  2003   rt eye area  . BREAST FIBROADENOMA SURGERY  1981   Left  . BREAST LUMPECTOMY Right Oct.29 & Mar 19, 2007   Right  . IR GENERIC HISTORICAL  04/16/2016   IR RADIOLOGIST EVAL & MGMT 04/16/2016 [facility]  . LAPAROSCOPY  1982   for endometriosis  . wisdom teeth extracted      There were no vitals filed for this visit.  Subjective Assessment - 04/18/17 0936    Subjective  Pt reports that overall she feels much improved since beginning PT. She has noticed on the Rt side, she has had almost instant relief with the dry  needling, up to atleast 75% improved. As far as the Lt side is concerned, she does feel improved, but maybe only 25% overall. She continues to have some mild headaches mostly on the Rt temporal region. She continues to complete her HEP regularly.     Pertinent History  breast cancer in the past    Patient Stated Goals  decrease stiffness and pain in neck muscles     Currently in Pain?  No/denies just discomfort along Lt upper trap, no pain     Pain Onset  More than a month ago         [facility] PT Assessment - 04/18/17 0001      Assessment   Referring Provider  Sarina Ill, MD    Onset Date/Surgical Date  -- ~9 years ago    Next MD Visit  unsure right now     Prior Therapy  seeing massage therapist       Precautions   Precautions  None      Balance Screen   Has the patient fallen in the past 6 months  No    Has the patient had a decrease in activity level because of a fear of falling?   No    Is the patient reluctant to leave their home because of a fear of  falling?   No      Home Film/video editor residence      Prior Function   Level of Independence  Independent    Vocation Requirements  Hair dresser       Cognition   Overall Cognitive Status  Within Functional Limits for tasks assessed      Observation/Other Assessments   Focus on Therapeutic Outcomes (FOTO)   29% limited       AROM   Cervical Flexion  WNL    Cervical Extension  WNL    Cervical - Right Side Bend  30 pull on the Lt     Cervical - Left Side Bend  35 full passive     Cervical - Right Rotation  60    Cervical - Left Rotation  50 discomfort on the Lt upper trap       Strength   Overall Strength Comments  BUE strength grossly 5/5 MMT    Cervical Flexion  4/5    Cervical - Right Side Bend  4/5    Cervical - Left Side Bend  4/5 discomfort Lt upper trap       Palpation   Palpation comment  --                  [facility] Adult PT Treatment/Exercise - 04/18/17 0001       Neck Exercises: Machines for Strengthening   UBE (Upper Arm Bike)  x2 min forward/backward L0  PT present to discuss progress      Manual Therapy   Myofascial Release  TPR Lt levator scap/upper trap; sub occipital release Lt/Rt             PT Education - 04/18/17 0950    Education provided  Yes    Education Details  goals/progress and POC moving forward; speaking with referring physician regarding lidocane injection prior to needling/PT session    Person(s) Educated  Patient    Methods  Explanation    Comprehension  Verbalized understanding       PT Short Term Goals - 04/18/17 0946      PT SHORT TERM GOAL #1   Title  Pt will demo consistency and independence with her HEP to improve cervical ROM and decrease pain throughout the day.     Status  Achieved      PT SHORT TERM GOAL #2   Title  Pt will demo improved cervical lateral flexion ROM to atleast 45 deg each direction.     Baseline  30-35 deg each direction active    Time  4    Period  Weeks    Status  Not Met    Target Date  05/16/17      PT SHORT TERM GOAL #3   Title  Pt will report atleast 25% reduction in her neck stiffness/pain from the start of PT to improve her activity tolerance.    Status  Achieved        PT Long Term Goals - 04/18/17 0946      PT LONG TERM GOAL #1   Title  Pt will demo improved cervical rotation to the Lt and Rt up to atleast 75 deg and without increase in pain, to improve her ability to turn her head while driving.     Baseline  Rt 60 deg, Lt 50 deg     Time  8    Period  Weeks    Status  Partially Met  Target Date  06/13/17      PT LONG TERM GOAL #2   Title  Pt will report atleast 75% improvement in cervical stiffness and pain, to improve her quality of life and ability to complete her work throughout the day.     Baseline  50% overall    Time  8    Period  Weeks    Status  Partially Met      PT LONG TERM GOAL #3   Title  Pt will demo improved deep cervical flexor strength  and endurance to atleast 4/5 MMT and without pain, to increase her ability to maintain proper cervical alignment throughout the day.     Time  8    Period  Weeks    Status  Partially Met      PT LONG TERM GOAL #4   Title  Pt will demo consistency and independence with her advanced HEP to allow her to maintain her flexibility and strength with discharge.     Time  8    Period  Weeks    Status  On-going            Plan - 04/18/17 1014    Clinical Impression Statement  Pt was reassessed this session having met several of her short and long term goals since beginning therapy. She demonstrates improved cervical rotation each direction, up to 50 deg and 60 deg. As well as improved cervical muscle strength. Overall, she feels 75% improved along the Rt side of her neck regarding pain and stiffness, with the Lt side continuing to provide discomfort/pain throughout the day. She has been diligent with her HEP and overall demonstrates good understanding of all components and improved posture awareness during her assessment today. Due to remaining limitations in cervical ROM and stiffness throughout the surrounding musculature, she would benefit from continued skilled PT to address this and facilitate full/pain free return to her daily activity at home and work.      Rehab Potential  Good    PT Frequency  2x / week    PT Duration  6 weeks    PT Treatment/Interventions  ADLs/Self Care Home Management;Cryotherapy;Electrical Stimulation;Moist Heat;Traction;Therapeutic exercise;Therapeutic activities;Neuromuscular re-education;Patient/family education;Manual techniques;Passive range of motion;Dry needling;Taping    PT Next Visit Plan  f/u on tennis ball occipital release; Assess shoulder ROM (UE breakouts) manual and modalities.     PT Home Exercise Plan  tennis ball release     Consulted and Agree with Plan of Care  Patient       Patient will benefit from skilled therapeutic intervention in order to  improve the following deficits and impairments:  Decreased activity tolerance, Impaired flexibility, Decreased strength, Decreased range of motion, Increased muscle spasms, Postural dysfunction, Improper body mechanics, Pain  Visit Diagnosis: Muscle spasm of back - Plan: PT plan of care cert/re-cert  Abnormal posture - Plan: PT plan of care cert/re-cert  Cervicalgia - Plan: PT plan of care cert/re-cert     Problem List Patient Active Problem List   Diagnosis Date Noted  . Trigeminal neuralgia 09/30/2016  . Migraine with aura and without status migrainosus 09/30/2016  . Neck pain 09/17/2016  . Ascending aortic aneurysm ([place]) 05/15/2016  . Abnormal MRA, brain 05/15/2016  . Sensorineural hearing loss (SNHL) of right ear 05/15/2016  . 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 Dyspnea 12/14/2014  . Seasonal and perennial allergic rhinitis 12/14/2014  . Genetic testing 12/08/2014  . Malignant neoplasm of lower-outer quadrant of right breast of  female, estrogen receptor positive ([place]) 08/23/2014  . Loss of transverse plantar arch 07/16/2014  . Elevated cholesterol 11/28/2010  . Depression with anxiety 11/28/2010  . Hereditary and idiopathic peripheral neuropathy 01/10/2010  . Tinnitus 01/10/2010  . HEARING LOSS, BILATERAL 01/10/2010  . Vitamin B12 deficiency 01/09/2010  . Fatigue 07/14/2008  . History of skin cancer 05/26/2007    10:57 AM,04/18/17 Elly Modena PT, DPT [facility] [address] [place], [place], [address] Phone: [phone]   Fax:  [phone]  Name: AVERILL WINTERS MRN: [id number] Date of Birth: 1960/08/10

## 2017-04-25 ENCOUNTER — Other Ambulatory Visit: Payer: Self-pay

## 2017-04-25 DIAGNOSIS — I712 Thoracic aortic aneurysm, without rupture, unspecified: Secondary | ICD-10-CM

## 2017-05-01 ENCOUNTER — Other Ambulatory Visit: Payer: Self-pay

## 2017-05-01 MED ORDER — PREDNISONE 50 MG PO TABS: ORAL_TABLET | ORAL | 0 refills | Status: DC

## 2017-05-01 MED ORDER — DIPHENHYDRAMINE HCL 50 MG PO TABS: ORAL_TABLET | ORAL | 0 refills | Status: DC

## 2017-05-01 MED FILL — predniSONE 50 MG TABS: 50 | 1 days supply | Qty: 3 | Fill #0

## 2017-05-06 ENCOUNTER — Ambulatory Visit: Payer: 59 | Attending: Neurology | Admitting: Physical Therapy

## 2017-05-06 ENCOUNTER — Encounter: Payer: Self-pay | Admitting: Physical Therapy

## 2017-05-06 DIAGNOSIS — M62830 Muscle spasm of back: Secondary | ICD-10-CM | POA: Insufficient documentation

## 2017-05-06 DIAGNOSIS — R293 Abnormal posture: Secondary | ICD-10-CM | POA: Diagnosis not present

## 2017-05-06 DIAGNOSIS — M542 Cervicalgia: Secondary | ICD-10-CM | POA: Diagnosis not present

## 2017-05-06 NOTE — Patient Instructions (Signed)
   Sidelying Thoracic Rotation  Lying on side, hips and knees at 90 degrees.  Rotate top hand and head backwards until stretch is felt in mid back area. x15 reps each direction.  [place] [address], [place], [place] [address] Phone # [phone] Fax [phone]

## 2017-05-06 NOTE — Therapy (Signed)
[facility] [address], [place], [place], [address] Phone: [phone]   Fax:  [phone]  Physical Therapy Treatment  Patient Details  Name: [name] MRN: [id number] Date of Birth: 21-Dec-1960 Referring Provider: Sarina Ill, MD   Encounter Date: 05/06/2017  PT End of Session - 05/06/17 1015    Visit Number  10    Date for PT Re-Evaluation  04/17/17    Authorization Type  [employer]    Authorization Time Period  03/06/17 to 04/17/17, NEW: 04/18/17 to 06/13/17    PT Start Time  0852    PT Stop Time  0930    PT Time Calculation (min)  38 min    Activity Tolerance  Patient tolerated treatment well;No increased pain    Behavior During Therapy  WFL for tasks assessed/performed       Past Medical History:  Diagnosis Date  . Adhesive capsulitis of shoulder   . Aortic aneurysm ([place])   . Cancer [facility]) 2008   Breast  . Congenital anomaly of aortic arch   . Labyrinthitis, unspecified   . Malignant neoplasm of breast (female), unspecified site   . Other B-complex deficiencies   . Other malaise and fatigue   . Unspecified hearing loss   . Unspecified hereditary and idiopathic peripheral neuropathy   . Unspecified tinnitus     Past Surgical History:  Procedure Laterality Date  . ABDOMINAL HYSTERECTOMY  04/2010   robotic  . BASAL CELL CARCINOMA EXCISION  2003   rt eye area  . BREAST FIBROADENOMA SURGERY  1981   Left  . BREAST LUMPECTOMY Right Oct.29 & Mar 19, 2007   Right  . IR GENERIC HISTORICAL  04/16/2016   IR RADIOLOGIST EVAL & MGMT 04/16/2016 [facility] RAD  . LAPAROSCOPY  1982   for endometriosis  . wisdom teeth extracted      There were no vitals filed for this visit.  Subjective Assessment - 05/06/17 0855    Subjective  Pt reports that things are going well. She continues to complete her exercise routine which seems to be helping alot. She does still have the soreness in the left side of  her neck. She was not able to get her injection before today's session.     Pertinent History  breast cancer in the past    Patient Stated Goals  decrease stiffness and pain in neck muscles     Currently in Pain?  No/denies    Pain Onset  More than a month ago                      [facility] Adult PT Treatment/Exercise - 05/06/17 0001      Neck Exercises: Sidelying   Other Sidelying Exercise  thoracic rotation x10 reps each       Manual Therapy   Manual Therapy  Joint mobilization    Joint Mobilization  Grade IV CPAs thoracic spine, cavitation noted along T5    Soft tissue mobilization  STM Lt SCM/upper trap/levator scap/cervical paraspinals    Myofascial Release  TPR Lt upper trap, Lt levator scap             PT Education - 05/06/17 0928    Education provided  Yes    Education Details  importance of addressing thoracic mobility and shoulder mobility restrictions to allow for improved muscle activation/control during work/daily activity    Person(s) Educated  Patient    Methods  Explanation;Handout  Comprehension  Verbalized understanding;Returned demonstration       PT Short Term Goals - 04/18/17 0946      PT SHORT TERM GOAL #1   Title  Pt will demo consistency and independence with her HEP to improve cervical ROM and decrease pain throughout the day.     Status  Achieved      PT SHORT TERM GOAL #2   Title  Pt will demo improved cervical lateral flexion ROM to atleast 45 deg each direction.     Baseline  30-35 deg each direction active    Time  4    Period  Weeks    Status  Not Met    Target Date  05/16/17      PT SHORT TERM GOAL #3   Title  Pt will report atleast 25% reduction in her neck stiffness/pain from the start of PT to improve her activity tolerance.    Status  Achieved        PT Long Term Goals - 04/18/17 0946      PT LONG TERM GOAL #1   Title  Pt will demo improved cervical rotation to the Lt and Rt up to atleast 75 deg and without  increase in pain, to improve her ability to turn her head while driving.     Baseline  Rt 60 deg, Lt 50 deg     Time  8    Period  Weeks    Status  Partially Met    Target Date  06/13/17      PT LONG TERM GOAL #2   Title  Pt will report atleast 75% improvement in cervical stiffness and pain, to improve her quality of life and ability to complete her work throughout the day.     Baseline  50% overall    Time  8    Period  Weeks    Status  Partially Met      PT LONG TERM GOAL #3   Title  Pt will demo improved deep cervical flexor strength and endurance to atleast 4/5 MMT and without pain, to increase her ability to maintain proper cervical alignment throughout the day.     Time  8    Period  Weeks    Status  Partially Met      PT LONG TERM GOAL #4   Title  Pt will demo consistency and independence with her advanced HEP to allow her to maintain her flexibility and strength with discharge.     Time  8    Period  Weeks    Status  On-going            Plan - 05/06/17 1015    Clinical Impression Statement  Pt arrived today with continued HEP adherence with Rt cervical musculature stiffness/tightness. Spent a majority of today's session addressing soft tissue/joint restrictions, which is mostly noted throughout the thoracic and scapular region. Made an addition to her HEP to encourage thoracic mobility improvements, and pt demonstrated  good understanding.     Rehab Potential  Good    PT Frequency  2x / week    PT Duration  6 weeks    PT Treatment/Interventions  ADLs/Self Care Home Management;Cryotherapy;Electrical Stimulation;Moist Heat;Traction;Therapeutic exercise;Therapeutic activities;Neuromuscular re-education;Patient/family education;Manual techniques;Passive range of motion;Dry needling;Taping    PT Next Visit Plan  thoracic/scapular mobility; f/u on dry needling interest    PT Home Exercise Plan  tennis ball release, thoracic rotation     Consulted and Agree with Plan  of Care   Patient       Patient will benefit from skilled therapeutic intervention in order to improve the following deficits and impairments:  Decreased activity tolerance, Impaired flexibility, Decreased strength, Decreased range of motion, Increased muscle spasms, Postural dysfunction, Improper body mechanics, Pain  Visit Diagnosis: Muscle spasm of back  Abnormal posture  Cervicalgia     Problem List Patient Active Problem List   Diagnosis Date Noted  . Trigeminal neuralgia 09/30/2016  . Migraine with aura and without status migrainosus 09/30/2016  . Neck pain 09/17/2016  . Ascending aortic aneurysm ([place]) 05/15/2016  . Abnormal MRA, brain 05/15/2016  . Sensorineural hearing loss (SNHL) of right ear 05/15/2016  . Aortic root aneurysm ([place]) 05/03/2016  . Dyspnea 12/14/2014  . Seasonal and perennial allergic rhinitis 12/14/2014  . Genetic testing 12/08/2014  . Malignant neoplasm of lower-outer quadrant of right breast of female, estrogen receptor positive ([place]) 08/23/2014  . Loss of transverse plantar arch 07/16/2014  . Elevated cholesterol 11/28/2010  . Depression with anxiety 11/28/2010  . Hereditary and idiopathic peripheral neuropathy 01/10/2010  . Tinnitus 01/10/2010  . HEARING LOSS, BILATERAL 01/10/2010  . Vitamin B12 deficiency 01/09/2010  . Fatigue 07/14/2008  . History of skin cancer 05/26/2007   10:48 AM,05/06/17 Elly Modena PT, DPT [place] [facility] [address] [place], [place], [address] Phone: [phone]   Fax:  [phone]  Name: MAMMIE MERAS MRN: [id number] Date of Birth: 11/19/1960

## 2017-05-10 ENCOUNTER — Ambulatory Visit: Payer: 59 | Admitting: Physical Therapy

## 2017-05-10 ENCOUNTER — Encounter: Payer: Self-pay | Admitting: Physical Therapy

## 2017-05-10 DIAGNOSIS — M62830 Muscle spasm of back: Secondary | ICD-10-CM

## 2017-05-10 DIAGNOSIS — M542 Cervicalgia: Secondary | ICD-10-CM | POA: Diagnosis not present

## 2017-05-10 DIAGNOSIS — R293 Abnormal posture: Secondary | ICD-10-CM | POA: Diagnosis not present

## 2017-05-10 NOTE — Therapy (Signed)
[facility] [address] [place], [place], [address] Phone: [phone]   Fax:  [phone]  Physical Therapy Treatment  Patient Details  Name: [name] MRN: [id number] Date of Birth: 26-Feb-1961 Referring Provider: Sarina Ill, MD   Encounter Date: 05/10/2017  PT End of Session - 05/10/17 0922    Visit Number  11    Date for PT Re-Evaluation  06/13/17    Authorization Type  [employer]    Authorization Time Period  03/06/17 to 04/17/17, NEW: 04/18/17 to 06/13/17    PT Start Time  0849    PT Stop Time  0930    PT Time Calculation (min)  41 min    Activity Tolerance  Patient tolerated treatment well;No increased pain    Behavior During Therapy  WFL for tasks assessed/performed       Past Medical History:  Diagnosis Date  . Adhesive capsulitis of shoulder   . Aortic aneurysm ([place])   . Cancer [facility]) 2008   Breast  . Congenital anomaly of aortic arch   . Labyrinthitis, unspecified   . Malignant neoplasm of breast (female), unspecified site   . Other B-complex deficiencies   . Other malaise and fatigue   . Unspecified hearing loss   . Unspecified hereditary and idiopathic peripheral neuropathy   . Unspecified tinnitus     Past Surgical History:  Procedure Laterality Date  . ABDOMINAL HYSTERECTOMY  04/2010   robotic  . BASAL CELL CARCINOMA EXCISION  2003   rt eye area  . BREAST FIBROADENOMA SURGERY  1981   Left  . BREAST LUMPECTOMY Right Oct.29 & Mar 19, 2007   Right  . IR GENERIC HISTORICAL  04/16/2016   IR RADIOLOGIST EVAL & MGMT 04/16/2016 [facility]  . LAPAROSCOPY  1982   for endometriosis  . wisdom teeth extracted      There were no vitals filed for this visit.  Subjective Assessment - 05/10/17 0852    Subjective  Pt reports that her exercises are going well. She has some continued stiffness/soreness along the Lt side of her neck and her Rt temple. Yesterday she woke up without  pain for the first time, but she started to have that pain after about an hour of being up.    Pertinent History  breast cancer in the past    Patient Stated Goals  decrease stiffness and pain in neck muscles     Currently in Pain?  No/denies    Pain Score  5     Pain Location  Neck    Pain Orientation  Left    Pain Descriptors / Indicators  Sore;Tightness    Pain Type  Chronic pain    Pain Radiating Towards  none     Pain Onset  More than a month ago    Pain Frequency  Intermittent    Aggravating Factors   unsure    Pain Relieving Factors  heat, exercise    Multiple Pain Sites  No                      [facility] Adult PT Treatment/Exercise - 05/10/17 0001      Neck Exercises: Machines for Strengthening   UBE (Upper Arm Bike)  x2 min forward/backward L2  PT present to discuss session      Neck Exercises: Seated   Neck Retraction  10 reps;3 secs;Limitations    Neck Retraction Limitations  heavy cuing required  Other Seated Exercise  chin tuck with horizontal abduction x10 reps, diagonal each direction x10 reps red TB       Neck Exercises: Supine   Neck Retraction  10 reps;3 secs      Manual Therapy   Manual therapy comments  Pec stretch: contract/relax x5 reps     Joint Mobilization  Grade III-IV CPAs cervical spine     Soft tissue mobilization  STM Lt SCM/upper trap/levator scap/cervical paraspinals    Myofascial Release  TPR Lt upper trap, Lt levator scap             PT Education - 05/10/17 0921    Education provided  Yes    Education Details  importance of improving deep cervical flexor activation/strength    Person(s) Educated  Patient    Methods  Explanation    Comprehension  Verbalized understanding       PT Short Term Goals - 04/18/17 0946      PT SHORT TERM GOAL #1   Title  Pt will demo consistency and independence with her HEP to improve cervical ROM and decrease pain throughout the day.     Status  Achieved      PT SHORT TERM GOAL #2    Title  Pt will demo improved cervical lateral flexion ROM to atleast 45 deg each direction.     Baseline  30-35 deg each direction active    Time  4    Period  Weeks    Status  Not Met    Target Date  05/16/17      PT SHORT TERM GOAL #3   Title  Pt will report atleast 25% reduction in her neck stiffness/pain from the start of PT to improve her activity tolerance.    Status  Achieved        PT Long Term Goals - 04/18/17 0946      PT LONG TERM GOAL #1   Title  Pt will demo improved cervical rotation to the Lt and Rt up to atleast 75 deg and without increase in pain, to improve her ability to turn her head while driving.     Baseline  Rt 60 deg, Lt 50 deg     Time  8    Period  Weeks    Status  Partially Met    Target Date  06/13/17      PT LONG TERM GOAL #2   Title  Pt will report atleast 75% improvement in cervical stiffness and pain, to improve her quality of life and ability to complete her work throughout the day.     Baseline  50% overall    Time  8    Period  Weeks    Status  Partially Met      PT LONG TERM GOAL #3   Title  Pt will demo improved deep cervical flexor strength and endurance to atleast 4/5 MMT and without pain, to increase her ability to maintain proper cervical alignment throughout the day.     Time  8    Period  Weeks    Status  Partially Met      PT LONG TERM GOAL #4   Title  Pt will demo consistency and independence with her advanced HEP to allow her to maintain her flexibility and strength with discharge.     Time  8    Period  Weeks    Status  On-going  Plan - 05/10/17 4854    Clinical Impression Statement  Pt arrived today reporting that she was able to wake up without any pain along the Lt side of her neck which is the first time in weeks/months that she has noticed this. She is unsure about dry needling at this time, so session focused initially on manual techniques to address muscle spasm and restrictions along the cervical  region. Introduced exercise to increase cervical and scapular control with some additional cuing needed to improve technique. Ended session without increase in pain. Will continue with current POC.    Rehab Potential  Good    PT Frequency  2x / week    PT Duration  6 weeks    PT Treatment/Interventions  ADLs/Self Care Home Management;Cryotherapy;Electrical Stimulation;Moist Heat;Traction;Therapeutic exercise;Therapeutic activities;Neuromuscular re-education;Patient/family education;Manual techniques;Passive range of motion;Dry needling;Taping    PT Next Visit Plan  thoracic/scapular mobility, TB work in supine; f/u on dry needling interest    PT Home Exercise Plan  tennis ball release, thoracic rotation, chin tuck     Consulted and Agree with Plan of Care  Patient       Patient will benefit from skilled therapeutic intervention in order to improve the following deficits and impairments:  Decreased activity tolerance, Impaired flexibility, Decreased strength, Decreased range of motion, Increased muscle spasms, Postural dysfunction, Improper body mechanics, Pain  Visit Diagnosis: Muscle spasm of back  Abnormal posture  Cervicalgia     Problem List Patient Active Problem List   Diagnosis Date Noted  . Trigeminal neuralgia 09/30/2016  . Migraine with aura and without status migrainosus 09/30/2016  . Neck pain 09/17/2016  . Ascending aortic aneurysm ([place]) 05/15/2016  . Abnormal MRA, brain 05/15/2016  . Sensorineural hearing loss (SNHL) of right ear 05/15/2016  . 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 Dyspnea 12/14/2014  . Seasonal and perennial allergic rhinitis 12/14/2014  . Genetic testing 12/08/2014  . Malignant neoplasm of lower-outer quadrant of right breast of female, estrogen receptor positive ([place]) 08/23/2014  . Loss of transverse plantar arch 07/16/2014  . Elevated cholesterol 11/28/2010  . Depression with anxiety 11/28/2010  . Hereditary and idiopathic peripheral  neuropathy 01/10/2010  . Tinnitus 01/10/2010  . HEARING LOSS, BILATERAL 01/10/2010  . Vitamin B12 deficiency 01/09/2010  . Fatigue 07/14/2008  . History of skin cancer 05/26/2007    9:56 AM,05/10/17 Elly Modena PT, DPT [place] [facility] [address] [place], [place], [address] Phone: [phone]   Fax:  [phone]  Name: [name] MRN: [id number] Date of Birth: 10-30-1960

## 2017-05-10 NOTE — Patient Instructions (Signed)
    SUPINE CHIN TUCKS  Lie on back.  Hands on belly or to side.  Gently press  back of head to surface and hold for 3 sec.  Relax and repeat 10-15x.     [place] [address] [place], [place] [address] Phone # [phone] Fax [phone]

## 2017-05-16 DIAGNOSIS — L720 Epidermal cyst: Secondary | ICD-10-CM | POA: Diagnosis not present

## 2017-05-16 DIAGNOSIS — L821 Other seborrheic keratosis: Secondary | ICD-10-CM | POA: Diagnosis not present

## 2017-05-16 DIAGNOSIS — L814 Other melanin hyperpigmentation: Secondary | ICD-10-CM | POA: Diagnosis not present

## 2017-05-16 DIAGNOSIS — L648 Other androgenic alopecia: Secondary | ICD-10-CM | POA: Diagnosis not present

## 2017-05-16 DIAGNOSIS — L988 Other specified disorders of the skin and subcutaneous tissue: Secondary | ICD-10-CM | POA: Diagnosis not present

## 2017-05-17 ENCOUNTER — Encounter: Payer: Self-pay | Admitting: Physical Therapy

## 2017-05-17 ENCOUNTER — Ambulatory Visit: Payer: 59 | Admitting: Physical Therapy

## 2017-05-17 DIAGNOSIS — M62830 Muscle spasm of back: Secondary | ICD-10-CM | POA: Diagnosis not present

## 2017-05-17 DIAGNOSIS — R293 Abnormal posture: Secondary | ICD-10-CM | POA: Diagnosis not present

## 2017-05-17 DIAGNOSIS — M542 Cervicalgia: Secondary | ICD-10-CM

## 2017-05-17 NOTE — Therapy (Signed)
[facility] [address], [place], [place], [address] Phone: [phone]   Fax:  [phone]  Physical Therapy Treatment  Patient Details  Name: [name] MRN: [id number] Date of Birth: 1960-11-08 Referring Provider: Sarina Ill, MD   Encounter Date: 05/17/2017  PT End of Session - 05/17/17 0933    Visit Number  12    Date for PT Re-Evaluation  06/13/17    Authorization Type  [employer]    Authorization Time Period  03/06/17 to 04/17/17, NEW: 04/18/17 to 06/13/17    PT Start Time  0850    PT Stop Time  0929    PT Time Calculation (min)  39 min    Activity Tolerance  Patient tolerated treatment well;No increased pain    Behavior During Therapy  WFL for tasks assessed/performed       Past Medical History:  Diagnosis Date  . Adhesive capsulitis of shoulder   . Aortic aneurysm ([place])   . Cancer [facility]) 2008   Breast  . Congenital anomaly of aortic arch   . Labyrinthitis, unspecified   . Malignant neoplasm of breast (female), unspecified site   . Other B-complex deficiencies   . Other malaise and fatigue   . Unspecified hearing loss   . Unspecified hereditary and idiopathic peripheral neuropathy   . Unspecified tinnitus     Past Surgical History:  Procedure Laterality Date  . ABDOMINAL HYSTERECTOMY  04/2010   robotic  . BASAL CELL CARCINOMA EXCISION  2003   rt eye area  . BREAST FIBROADENOMA SURGERY  1981   Left  . BREAST LUMPECTOMY Right Oct.29 & Mar 19, 2007   Right  . IR GENERIC HISTORICAL  04/16/2016   IR RADIOLOGIST EVAL & MGMT 04/16/2016 [facility] RAD  . LAPAROSCOPY  1982   for endometriosis  . wisdom teeth extracted      There were no vitals filed for this visit.  Subjective Assessment - 05/17/17 0852    Subjective  Pt reports that things just hit her yesterday with some increase in her Rt/Lt sided stiffness and along the front of the neck as well. She can tell her muscles in the  front of the neck are a little sore from her exercises and thinks maybe this increased her symptoms on the Rt side of her face with additional numbness of the tongue.     Pertinent History  breast cancer in the past    Patient Stated Goals  decrease stiffness and pain in neck muscles     Currently in Pain?  Yes    Pain Score  3     Pain Location  Neck    Pain Orientation  Left    Pain Descriptors / Indicators  Tightness    Pain Type  Chronic pain    Pain Radiating Towards  none     Pain Onset  More than a month ago    Pain Frequency  Intermittent    Aggravating Factors   unsure     Pain Relieving Factors  heat, stretches, exercise             [facility] Adult PT Treatment/Exercise - 05/17/17 0001      Neck Exercises: Machines for Strengthening   UBE (Upper Arm Bike)  x2 min forward/backward L2  PT present to discuss session      Neck Exercises: Supine   Neck Retraction  10 reps;3 secs      Manual Therapy  Soft tissue mobilization  STM Lt SCM/upper trap/levator scap/cervical paraspinals; Rt SCM, gentle massasge pressure release Rt temporalis muscle              PT Education - 05/17/17 1006    Education provided  Yes    Education Details  discussed HEP and picked 3-4 exercises for pt to complete at home to improve focus on specific limitations; technique adjustments to neck retraction/chin tuck for home; benefits of dry needling and importance of pt feeling comfortable with the treatment prior to completing     Person(s) Educated  Patient    Methods  Explanation;Verbal cues;Tactile cues    Comprehension  Verbalized understanding;Returned demonstration       PT Short Term Goals - 05/17/17 1012      PT SHORT TERM GOAL #1   Title  Pt will demo consistency and independence with her HEP to improve cervical ROM and decrease pain throughout the day.     Status  Achieved      PT SHORT TERM GOAL #2   Title  Pt will demo improved cervical lateral flexion ROM to atleast 45 deg  each direction.     Baseline  30-35 deg each direction active    Time  4    Period  Weeks    Status  Not Met      PT SHORT TERM GOAL #3   Title  Pt will report atleast 25% reduction in her neck stiffness/pain from the start of PT to improve her activity tolerance.    Status  Achieved        PT Long Term Goals - 04/18/17 0946      PT LONG TERM GOAL #1   Title  Pt will demo improved cervical rotation to the Lt and Rt up to atleast 75 deg and without increase in pain, to improve her ability to turn her head while driving.     Baseline  Rt 60 deg, Lt 50 deg     Time  8    Period  Weeks    Status  Partially Met    Target Date  06/13/17      PT LONG TERM GOAL #2   Title  Pt will report atleast 75% improvement in cervical stiffness and pain, to improve her quality of life and ability to complete her work throughout the day.     Baseline  50% overall    Time  8    Period  Weeks    Status  Partially Met      PT LONG TERM GOAL #3   Title  Pt will demo improved deep cervical flexor strength and endurance to atleast 4/5 MMT and without pain, to increase her ability to maintain proper cervical alignment throughout the day.     Time  8    Period  Weeks    Status  Partially Met      PT LONG TERM GOAL #4   Title  Pt will demo consistency and independence with her advanced HEP to allow her to maintain her flexibility and strength with discharge.     Time  8    Period  Weeks    Status  On-going            Plan - 05/17/17 0934    Clinical Impression Statement  Pt arrived with recent increase in anterior neck soreness. Therapist reviewed HEP additions from last session and noted increased recruitment of the SCM (Rt>Lt) which is likely contributing  to her increase in soreness. Made some adjustments and pt demonstrated good understanding by the end of today's session. Also completed manual treatment to decrease muscle tension in the area. Pt has been completing many different exercises  and therapist was able to select several that will target current limitations. Pt verbalized good understanding of this moving forward.     Rehab Potential  Good    PT Frequency  2x / week    PT Duration  6 weeks    PT Treatment/Interventions  ADLs/Self Care Home Management;Cryotherapy;Electrical Stimulation;Moist Heat;Traction;Therapeutic exercise;Therapeutic activities;Neuromuscular re-education;Patient/family education;Manual techniques;Passive range of motion;Dry needling;Taping    PT Next Visit Plan  cervical ROM measurement; f/u on technique with chin tuck; thoracic/scapular mobility with UE ranger, etc.; scapular control therex     PT Home Exercise Plan  supine pec stretch and horizontal abduction over foam roll, thoracic rotation, chin tuck     Consulted and Agree with Plan of Care  Patient       Patient will benefit from skilled therapeutic intervention in order to improve the following deficits and impairments:  Decreased activity tolerance, Impaired flexibility, Decreased strength, Decreased range of motion, Increased muscle spasms, Postural dysfunction, Improper body mechanics, Pain  Visit Diagnosis: Muscle spasm of back  Abnormal posture  Cervicalgia     Problem List Patient Active Problem List   Diagnosis Date Noted  . Trigeminal neuralgia 09/30/2016  . Migraine with aura and without status migrainosus 09/30/2016  . Neck pain 09/17/2016  . Ascending aortic aneurysm ([place]) 05/15/2016  . Abnormal MRA, brain 05/15/2016  . Sensorineural hearing loss (SNHL) of right ear 05/15/2016  . 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 Dyspnea 12/14/2014  . Seasonal and perennial allergic rhinitis 12/14/2014  . Genetic testing 12/08/2014  . Malignant neoplasm of lower-outer quadrant of right breast of female, estrogen receptor positive ([place]) 08/23/2014  . Loss of transverse plantar arch 07/16/2014  . Elevated cholesterol 11/28/2010  . Depression with anxiety 11/28/2010  .  Hereditary and idiopathic peripheral neuropathy 01/10/2010  . Tinnitus 01/10/2010  . HEARING LOSS, BILATERAL 01/10/2010  . Vitamin B12 deficiency 01/09/2010  . Fatigue 07/14/2008  . History of skin cancer 05/26/2007    11:59 AM,05/17/17 Sherol Dade PT, DPT [facility] [address], [place], [place], [address] Phone: [phone]   Fax:  [phone]  Name: CLEORA KARNIK MRN: [id number] Date of Birth: 01/22/1961

## 2017-05-20 ENCOUNTER — Encounter: Payer: 59 | Admitting: Physical Therapy

## 2017-05-27 ENCOUNTER — Encounter: Payer: Self-pay | Admitting: Physical Therapy

## 2017-05-27 ENCOUNTER — Ambulatory Visit: Payer: 59 | Admitting: Physical Therapy

## 2017-05-27 DIAGNOSIS — M542 Cervicalgia: Secondary | ICD-10-CM

## 2017-05-27 DIAGNOSIS — M62830 Muscle spasm of back: Secondary | ICD-10-CM | POA: Diagnosis not present

## 2017-05-27 DIAGNOSIS — R293 Abnormal posture: Secondary | ICD-10-CM | POA: Diagnosis not present

## 2017-05-27 NOTE — Patient Instructions (Signed)
Happy Baby exercise: Goal is to mobilize your thoracic spine and facilitate the scapular muscles while you keep jaw and neck muscles soft. Arms are bent at 90 degrees by your side. Inhale to prepare, exhale long and slow as you push your arms into the mat, gently squeezing the shoulder blades and lifting the heart. Hold 2 sec, and then relax. Do only 6x. Go slow and intentional. Your should feel like the collarbones gently lengthen.    Also, think about where you put the work along your Lt side when shampooing and using the LT upper extremity.   Red band horizontal pulls: start with 6 reps x2 sets, SLOWLY build to 10x. Do in sitting while your practice organizing your neck: lengthen the back of the neck and lift thorugh the crown of your head gently.

## 2017-05-27 NOTE — Therapy (Addendum)
[facility] [address] [place], [place], [address] Phone: [phone]   Fax:  [phone]  Physical Therapy Treatment  Patient Details  Name: [name] MRN: [id number] Date of Birth: November 03, 1960 Referring Provider: Sarina Ill, MD   Encounter Date: 05/27/2017  PT End of Session - 05/27/17 0926    Visit Number  13    Date for PT Re-Evaluation  06/13/17    Authorization Type  [employer]    Authorization Time Period  03/06/17 to 04/17/17, NEW: 04/18/17 to 06/13/17    PT Start Time  0926    PT Stop Time  1017    PT Time Calculation (min)  51 min    Activity Tolerance  Patient tolerated treatment well;No increased pain    Behavior During Therapy  WFL for tasks assessed/performed       Past Medical History:  Diagnosis Date  . Adhesive capsulitis of shoulder   . Aortic aneurysm ([place])   . Cancer [facility]) 2008   Breast  . Congenital anomaly of aortic arch   . Labyrinthitis, unspecified   . Malignant neoplasm of breast (female), unspecified site   . Other B-complex deficiencies   . Other malaise and fatigue   . Unspecified hearing loss   . Unspecified hereditary and idiopathic peripheral neuropathy   . Unspecified tinnitus     Past Surgical History:  Procedure Laterality Date  . ABDOMINAL HYSTERECTOMY  04/2010   robotic  . BASAL CELL CARCINOMA EXCISION  2003   rt eye area  . BREAST FIBROADENOMA SURGERY  1981   Left  . BREAST LUMPECTOMY Right Oct.29 & Mar 19, 2007   Right  . IR GENERIC HISTORICAL  04/16/2016   IR RADIOLOGIST EVAL & MGMT 04/16/2016 [facility]  . LAPAROSCOPY  1982   for endometriosis  . wisdom teeth extracted      There were no vitals filed for this visit.  Subjective Assessment - 05/27/17 0927    Subjective  This AM: Jaw feels tight Lt > Rt . Is looking forward to dry needling next session.  My face is numb today     Pertinent History  breast cancer in the past     Currently in Pain?  Yes I have discomfort in alng my Lt neck and tightness in the jaw muscles.     Multiple Pain Sites  No                      [facility] Adult PT Treatment/Exercise - 05/27/17 0001      Self-Care   Posture  Identifying what muscles she is using more with her LTUE when doing hair. How to align the cervical to neutral and perform postural exercises focused at scapular muscles       Neck Exercises: Machines for Strengthening   UBE (Upper Arm Bike)  siiting on blue ball: L2 3x3 with postural emphasis      Neck Exercises: Seated   Other Seated Exercise  Red band horizontal abd 2x6        Neck Exercises: Supine   Other Supine Exercise  Baby fish yoga ex. 2x6       Manual Therapy   Soft tissue mobilization  STM Lt SCM/upper trap/levator scap/cervical paraspinals; Rt SCM, gentle massasge pressure release Rt temporalis muscle       Neck Exercises: Stretches   Other Neck Stretches  LT SCM stretch static  3x 15 sec  PT Education - 05/27/17 1008    Education provided  Yes    Education Details  Happy Baby exercise in supine for HEP, and red band horizontal abd in sitting    Person(s) Educated  Patient    Methods  Explanation;Demonstration;Tactile cues;Verbal cues;Handout    Comprehension  Verbalized understanding;Returned demonstration       PT Short Term Goals - 05/17/17 1012      PT SHORT TERM GOAL #1   Title  Pt will demo consistency and independence with her HEP to improve cervical ROM and decrease pain throughout the day.     Status  Achieved      PT SHORT TERM GOAL #2   Title  Pt will demo improved cervical lateral flexion ROM to atleast 45 deg each direction.     Baseline  30-35 deg each direction active    Time  4    Period  Weeks    Status  Not Met      PT SHORT TERM GOAL #3   Title  Pt will report atleast 25% reduction in her neck stiffness/pain from the start of PT to improve her activity tolerance.    Status  Achieved         PT Long Term Goals - 04/18/17 0946      PT LONG TERM GOAL #1   Title  Pt will demo improved cervical rotation to the Lt and Rt up to atleast 75 deg and without increase in pain, to improve her ability to turn her head while driving.     Baseline  Rt 60 deg, Lt 50 deg     Time  8    Period  Weeks    Status  Partially Met    Target Date  06/13/17      PT LONG TERM GOAL #2   Title  Pt will report atleast 75% improvement in cervical stiffness and pain, to improve her quality of life and ability to complete her work throughout the day.     Baseline  50% overall    Time  8    Period  Weeks    Status  Partially Met      PT LONG TERM GOAL #3   Title  Pt will demo improved deep cervical flexor strength and endurance to atleast 4/5 MMT and without pain, to increase her ability to maintain proper cervical alignment throughout the day.     Time  8    Period  Weeks    Status  Partially Met      PT LONG TERM GOAL #4   Title  Pt will demo consistency and independence with her advanced HEP to allow her to maintain her flexibility and strength with discharge.     Time  8    Period  Weeks    Status  On-going            Plan - 05/27/17 7591    Clinical Impression Statement  Pt feels like her chin tucking is improving and the anterior neck muscles are not tight and sore. At beginning of session pt reported facial numbness, this did not exacerbate throughout the session. Pt was able to perform postural strengthening in sitting and cervical stretches mainly for SCM  all without exacerbating her symptoms. Continues with soft tissue work to mainly the LT > RT cervical muscles. PTA noted they responded pretty quickly, softening in tone. No major active trigger points were found.  Discussion was also had regarding  what she does with her LTUE when doing hair. Since this tends to be the stabilizing arm she was asked to notice which muscles she uses: her shoulder or neck & jaw? Might be uselful  information. She is looking forward to trying dry needling for the left side of her neck.     Rehab Potential  Good    PT Frequency  2x / week    PT Duration  6 weeks    PT Treatment/Interventions  ADLs/Self Care Home Management;Cryotherapy;Electrical Stimulation;Moist Heat;Traction;Therapeutic exercise;Therapeutic activities;Neuromuscular re-education;Patient/family education;Manual techniques;Passive range of motion;Dry needling;Taping    PT Next Visit Plan  DN, manual to cervical and jaw, continue with postural strength and endurance that stimluates her work.     Consulted and Agree with Plan of Care  Patient       Patient will benefit from skilled therapeutic intervention in order to improve the following deficits and impairments:  Decreased activity tolerance, Impaired flexibility, Decreased strength, Decreased range of motion, Increased muscle spasms, Postural dysfunction, Improper body mechanics, Pain  Visit Diagnosis: Muscle spasm of back  Abnormal posture  Cervicalgia     Problem List Patient Active Problem List   Diagnosis Date Noted  . Trigeminal neuralgia 09/30/2016  . Migraine with aura and without status migrainosus 09/30/2016  . Neck pain 09/17/2016  . Ascending aortic aneurysm ([place]) 05/15/2016  . Abnormal MRA, brain 05/15/2016  . Sensorineural hearing loss (SNHL) of right ear 05/15/2016  . 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 Dyspnea 12/14/2014  . Seasonal and perennial allergic rhinitis 12/14/2014  . Genetic testing 12/08/2014  . Malignant neoplasm of lower-outer quadrant of right breast of female, estrogen receptor positive ([place]) 08/23/2014  . Loss of transverse plantar arch 07/16/2014  . Elevated cholesterol 11/28/2010  . Depression with anxiety 11/28/2010  . Hereditary and idiopathic peripheral neuropathy 01/10/2010  . Tinnitus 01/10/2010  . HEARING LOSS, BILATERAL 01/10/2010  . Vitamin B12 deficiency 01/09/2010  . Fatigue 07/14/2008  . History of  skin cancer 05/26/2007    Gloris Shiroma, PTA 05/27/2017, 12:30 PM PHYSICAL THERAPY DISCHARGE SUMMARY  Visits from Start of Care: 13  Current functional level related to goals / functional outcomes: See above for current status.  Pt didn't return to PT.     Remaining deficits: See above for most current status.     Education / Equipment: HEP, posture/body mechanics Plan: Patient agrees to discharge.  Patient goals were partially met. Patient is being discharged due to not returning since the last visit.  ?????        Sigurd Sos, PT 07/22/17 8:52 AM  [facility] [address] [place], [place], [address] Phone: [phone]   Fax:  [phone]  Name: LYRIQ FINERTY MRN: [id number] Date of Birth: 12-16-1960

## 2017-05-29 ENCOUNTER — Encounter: Payer: Self-pay | Admitting: Surgery

## 2017-05-29 ENCOUNTER — Ambulatory Visit
Admission: RE | Admit: 2017-05-29 | Discharge: 2017-05-29 | Disposition: A | Discharge status: Home / Self Care | Payer: 59 | Source: Ambulatory Visit | Attending: Surgery | Admitting: Surgery

## 2017-05-29 ENCOUNTER — Ambulatory Visit: Payer: 59 | Admitting: Surgery

## 2017-05-29 VITALS — BP 134/79 | HR 60 | Resp 20 | Ht 66.0 in | Wt 135.0 lb

## 2017-05-29 DIAGNOSIS — I7121 Aneurysm of the ascending aorta, without rupture: Secondary | ICD-10-CM

## 2017-05-29 DIAGNOSIS — I712 Thoracic aortic aneurysm, without rupture, unspecified: Secondary | ICD-10-CM

## 2017-05-29 MED ORDER — GADOBENATE DIMEGLUMINE 529 MG/ML IV SOLN
13.0000 mL | Freq: Once | INTRAVENOUS | Status: AC | PRN
  Administered 2017-05-29: 13 mL via INTRAVENOUS

## 2017-05-29 NOTE — Progress Notes (Signed)
HPI:  The patient returns today for follow-up of a 4.0 cm fusiform ascending aortic aneurysm that was discovered on a breast MRI in 10/2004.  It was measured at 4 cm at that time.  She has had several CT scans of the chest since then which have always measured the aneurysm at 38-40 mm. She was reportedly referred to [facility] in the past for a medical genetics evaluation to rule out hereditary aneurysm disease and was ruled out for Marfans and Ehlers-Danlos. She reportedly had a second degree relative with Ehlers-Danlos.  I last saw her on 12/07/2015 and MRA of the chest at that time showed the maximum diameter of her ascending aorta 39-40 mm.  She continues to do well and has no chest or back pain.    Current Outpatient Medications  Medication Sig Dispense Refill  . Ascorbic Acid (VITAMIN C) 1000 MG tablet Take 1,000 mg by mouth daily.    Marland Kitchen b complex vitamins tablet Take 1 tablet by mouth daily.    Azucena Freed Serrata (BOSWELLIA PO) Take 100 mg by mouth 2 (two) times daily.    . Cholecalciferol (VITAMIN D) 2000 UNITS CAPS Take by mouth daily.    . clonazePAM (KLONOPIN) 0.5 MG tablet TAKE 1 TABLET BY MOUTH TWICE DAILY AS NEEDED ANXIETY 60 tablet 0  . Coenzyme Q10 (COQ10 PO) Take 100 mg by mouth 2 (two) times daily.    . cyclobenzaprine (FLEXERIL) 5 MG tablet Take 1 tablet (5 mg total) by mouth every 8 (eight) hours as needed for muscle spasms. 90 tablet 12  . diphenhydrAMINE (BENADRYL) 50 MG tablet Take 50mg  by mouth one hour before procedure. 30 tablet 0  . HYDROcodone-acetaminophen (NORCO/VICODIN) 5-325 MG tablet Take 1 tablet by mouth every 6 (six) hours as needed for moderate pain. 60 tablet 0  . Multiple Vitamin (MULTIVITAMIN) tablet Take 1 tablet by mouth daily.    . predniSONE (DELTASONE) 50 MG tablet Pre-medication regime Take 50mg  by mouth 13 hrs, 7hrs, and 1 hour before procedure. 3 tablet 0  . Probiotic Product (PROBIOTIC DAILY PO) Take by mouth daily.    . Turmeric 500 MG CAPS Take  500 mg by mouth 2 (two) times daily.    Marland Kitchen UNABLE TO FIND Take 500 mg by mouth 2 (two) times daily. Black Cumin Seed Oil for pain     . venlafaxine XR (EFFEXOR-XR) 37.5 MG 24 hr capsule Take 1 capsule (37.5 mg total) by mouth daily with breakfast. 90 capsule 4   No current facility-administered medications for this visit.      Physical Exam: BP 134/79   Pulse 60   Resp 20   Ht 5\' 6"  (1.676 m)   Wt 135 lb (61.2 kg)   SpO2 96% Comment: RA  BMI 21.79 kg/m  She looks well Lung exam is clear Cardiac exam shows a regular rate and rhythm with normal heart sounds. There is no murmur.   Diagnostic Tests:  CLINICAL DATA:  Evaluate thoracic aortic aneurysm  EXAM: MRA CHEST WITH CONTRAST  TECHNIQUE: Angiographic images of the chest were obtained using MRA technique with intravenous contrast.  CONTRAST:  56mL MULTIHANCE GADOBENATE DIMEGLUMINE 529 MG/ML IV SOLN  COMPARISON:  Chest CT - 05/18/2016; 11/22/2014; 03/19/2009; chest MRA - 12/07/2015  FINDINGS: Vascular Findings:  Grossly unchanged mild fusiform aneurysmal dilatation of the ascending thoracic aorta was measurements as follows. The thoracic aorta tapers to a normal caliber at the level of the aortic arch. Note is again made of  a small ductus diverticulum. No definite evidence of thoracic aortic dissection or periaortic stranding on this nongated examination.  Normal heart size.  No pericardial effusion.  Although this examination was not tailored for the evaluation the pulmonary arteries, there are no discrete filling defects within the central pulmonary arterial tree to suggest central pulmonary embolism. Normal caliber of the main pulmonary artery.  -------------------------------------------------------------  Thoracic aortic measurements:  Sinotubular junction  30 mm as measured in greatest oblique coronal dimension.  Proximal ascending aorta  40 mm as measured in greatest oblique axial  dimension at the level of the main pulmonary artery (image 38, series 15) and approximately 40 mm in greatest oblique short axis coronal diameter (coronal image 42, series 12)  Aortic arch aorta  Twenty-eight as measured in greatest oblique short axis axial diameter (image 19, series 15) an approximately 26 mm in greatest short axis oblique coronal diameter (coronal image 59, series 13)  Proximal descending thoracic aorta  24 mm as measured in greatest oblique axial dimension at the level of the main pulmonary artery.  Distal descending thoracic aorta  20 mm as measured in greatest oblique axial dimension at the level of the diaphragmatic hiatus.  Review of the MIP images confirms the above findings.  -------------------------------------------------------------  Non-Vascular Findings:  Mediastinum/Lymph Nodes: No bulky mediastinal, hilar axillary lymphadenopathy.  Lungs/Pleura: No focal airspace opacities.  No pleural effusion.  Upper abdomen: Limited evaluation of the upper abdomen is unremarkable.  Musculoskeletal: No definite acute or aggressive osseous abnormalities.  IMPRESSION: Stable uncomplicated mild fusiform aneurysmal dilatation of the ascending thoracic aorta measuring 40 mm in diameter, grossly unchanged compared to remote chest CT performed 02/2009.   Electronically Signed   By: Sandi Mariscal M.D.   On: 05/29/2017 11:06   Impression:  She has a stable 4.0 cm fusiform ascending which has not changed significantly since 2010.  This was first seen in 2006 when it measured 3.7 cm.  This is well below the 5.5 cm surgical threshold and I think that given the stability of this she could wait for 2 years to have another study.  I reviewed this MRA images with her today and answered her questions.  Her blood pressure is under adequate control.  We discussed a lifting restriction of about 25 pounds and avoiding use of fluoroquinolone antibiotics  which have been associated with connective tissue laxity and enlargement of aortic aneurysms.  Plan:  I will plan to see her back in 2 years with an MRA of the chest.   I spent 15 minutes performing this established patient evaluation and > 50% of this time was spent face to face counseling and coordinating the care of this patient's aortic aneurysm.    Gaye Pollack, MD [facility] [phone]

## 2017-06-17 MED FILL — VENLAFAXINE HCL ER 37.5 MG: 37.5 | 90 days supply | Qty: 90 | Fill #1

## 2017-06-24 NOTE — Progress Notes (Signed)
This encounter was created in error - please disregard.

## 2017-08-01 ENCOUNTER — Ambulatory Visit: Payer: 59 | Admitting: Family Medicine

## 2017-08-01 ENCOUNTER — Encounter: Payer: Self-pay | Admitting: Family Medicine

## 2017-08-01 ENCOUNTER — Ambulatory Visit (HOSPITAL_COMMUNITY)
Admission: RE | Admit: 2017-08-01 | Discharge: 2017-08-01 | Disposition: A | Discharge status: Home / Self Care | Payer: 59 | Source: Ambulatory Visit | Attending: Family Medicine | Admitting: Family Medicine

## 2017-08-01 VITALS — BP 118/84 | HR 70 | Temp 98.4°F | Ht 66.0 in | Wt 137.2 lb

## 2017-08-01 DIAGNOSIS — R1011 Right upper quadrant pain: Secondary | ICD-10-CM | POA: Diagnosis not present

## 2017-08-01 DIAGNOSIS — F418 Other specified anxiety disorders: Secondary | ICD-10-CM | POA: Diagnosis not present

## 2017-08-01 LAB — COMPREHENSIVE METABOLIC PANEL
ALBUMIN: 4.4 g/dL (ref 3.5–5.2)
ALT: 17 U/L (ref 0–35)
AST: 21 U/L (ref 0–37)
Alkaline Phosphatase: 58 U/L (ref 39–117)
BILIRUBIN TOTAL: 0.6 mg/dL (ref 0.2–1.2)
BUN: 16 mg/dL (ref 6–23)
CALCIUM: 9.3 mg/dL (ref 8.4–10.5)
CO2: 28 mEq/L (ref 19–32)
CREATININE: 0.74 mg/dL (ref 0.40–1.20)
Chloride: 100 mEq/L (ref 96–112)
GFR: 85.49 mL/min (ref >60.00)
Glucose, Bld: 105 mg/dL — ABNORMAL HIGH (ref 70–99)
Potassium: 4 mEq/L (ref 3.5–5.1)
Sodium: 136 mEq/L (ref 135–145)
Total Protein: 7.2 g/dL (ref 6.0–8.3)

## 2017-08-01 LAB — CBC
HCT: 45 % (ref 36.0–46.0)
Hemoglobin: 15.3 g/dL — ABNORMAL HIGH (ref 12.0–15.0)
MCHC: 33.9 g/dL (ref 30.0–36.0)
MCV: 90.8 fl (ref 78.0–100.0)
PLATELETS: 337 10*3/uL (ref 150.0–400.0)
RBC: 4.96 Mil/uL (ref 3.87–5.11)
RDW: 12.9 % (ref 11.5–15.5)
WBC: 4.9 10*3/uL (ref 4.0–10.5)

## 2017-08-01 LAB — LIPASE: Lipase: 18 U/L (ref 11.0–59.0)

## 2017-08-01 NOTE — Progress Notes (Signed)
Subjective:  [name] is a 57 y.o. year old very pleasant female patient who presents for/with See problem oriented charting ROS- no chest pain of shortness of breath. No vomiting or fever. Some nausea.    Past Medical History-  Patient Active Problem List   Diagnosis Date Noted  . Trigeminal neuralgia 09/30/2016    Priority: High  . Migraine with aura and without status migrainosus 09/30/2016    Priority: High  . 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 Malignant neoplasm of lower-outer quadrant of right breast of female, estrogen receptor positive ([place]) 08/23/2014    Priority: High  . Tinnitus 01/10/2010    Priority: High  . Elevated cholesterol 11/28/2010    Priority: Medium  . Depression with anxiety 11/28/2010    Priority: Medium  . HEARING LOSS, BILATERAL 01/10/2010    Priority: Medium  . Dyspnea 12/14/2014    Priority: Low  . Seasonal and perennial allergic rhinitis 12/14/2014    Priority: Low  . Genetic testing 12/08/2014    Priority: Low  . Loss of transverse plantar arch 07/16/2014    Priority: Low  . Hereditary and idiopathic peripheral neuropathy 01/10/2010    Priority: Low  . Vitamin B12 deficiency 01/09/2010    Priority: Low  . Fatigue 07/14/2008    Priority: Low  . History of skin cancer 05/26/2007    Priority: Low  . Neck pain 09/17/2016  . Ascending aortic aneurysm ([place]) 05/15/2016  . Abnormal MRA, brain 05/15/2016  . Sensorineural hearing loss (SNHL) of right ear 05/15/2016    Medications- reviewed and updated Current Outpatient Medications  Medication Sig Dispense Refill  . magnesium oxide (MAG-OX) 400 MG tablet Take 400 mg by mouth daily.    . riboflavin (VITAMIN B-2) 100 MG TABS tablet Take 100 mg by mouth daily.    . Ascorbic Acid (VITAMIN C) 1000 MG tablet Take 1,000 mg by mouth daily.    Marland Kitchen b complex vitamins tablet Take 1 tablet by mouth daily.    Azucena Freed Serrata (BOSWELLIA PO) Take 100 mg by mouth 2 (two)  times daily.    . Cholecalciferol (VITAMIN D) 2000 UNITS CAPS Take by mouth daily.    . clonazePAM (KLONOPIN) 0.5 MG tablet TAKE 1 TABLET BY MOUTH TWICE DAILY AS NEEDED ANXIETY 60 tablet 0  . Coenzyme Q10 (COQ10 PO) Take 100 mg by mouth 2 (two) times daily.    . cyclobenzaprine (FLEXERIL) 5 MG tablet Take 1 tablet (5 mg total) by mouth every 8 (eight) hours as needed for muscle spasms. 90 tablet 12  . diphenhydrAMINE (BENADRYL) 50 MG tablet Take 50mg  by mouth one hour before procedure. 30 tablet 0  . HYDROcodone-acetaminophen (NORCO/VICODIN) 5-325 MG tablet Take 1 tablet by mouth every 6 (six) hours as needed for moderate pain. 60 tablet 0  . Multiple Vitamin (MULTIVITAMIN) tablet Take 1 tablet by mouth daily.    . Probiotic Product (PROBIOTIC DAILY PO) Take by mouth daily.    . Turmeric 500 MG CAPS Take 500 mg by mouth 2 (two) times daily.    Marland Kitchen UNABLE TO FIND Take 500 mg by mouth 2 (two) times daily. Black Cumin Seed Oil for pain     . venlafaxine XR (EFFEXOR-XR) 37.5 MG 24 hr capsule Take 1 capsule (37.5 mg total) by mouth daily with breakfast. 90 capsule 4   No current facility-administered medications for this visit.     Objective: BP 118/84 (BP Location: Left Arm, Patient Position:  Sitting, Cuff Size: Normal)   Pulse 70   Temp 98.4 F (36.9 C) (Oral)   Ht 5\' 6"  (1.676 m)   Wt 137 lb 3.2 oz (62.2 kg)   SpO2 95%   BMI 22.14 kg/m  Gen: NAD, resting comfortably through discussion, on exam in pain as below CV: RRR no murmurs rubs or gallops Lungs: CTAB no crackles, wheeze, rhonchi Abdomen: soft/she has moderate tenderness in RUQ without rebound or guarding. Normal bowel sounds Ext: no edema Skin: warm, dry Neuro: normal gait  Results for orders placed or performed in visit on 08/01/17 (from the past 24 hour(s))  CBC     Status: Abnormal   Collection Time: 08/01/17 10:11 AM  Result Value Ref Range   WBC 4.9 4.0 - 10.5 K/uL   RBC 4.96 3.87 - 5.11 Mil/uL   Platelets 337.0 150.0 -  400.0 K/uL   Hemoglobin 15.3 (H) 12.0 - 15.0 g/dL   HCT 45.0 36.0 - 46.0 %   MCV 90.8 78.0 - 100.0 fl   MCHC 33.9 30.0 - 36.0 g/dL   RDW 12.9 11.5 - 15.5 %  Comprehensive metabolic panel     Status: Abnormal   Collection Time: 08/01/17 10:11 AM  Result Value Ref Range   Sodium 136 135 - 145 mEq/L   Potassium 4.0 3.5 - 5.1 mEq/L   Chloride 100 96 - 112 mEq/L   CO2 28 19 - 32 mEq/L   Glucose, Bld 105 (H) 70 - 99 mg/dL   BUN 16 6 - 23 mg/dL   Creatinine, Ser 0.74 0.40 - 1.20 mg/dL   Total Bilirubin 0.6 0.2 - 1.2 mg/dL   Alkaline Phosphatase 58 39 - 117 U/L   AST 21 0 - 37 U/L   ALT 17 0 - 35 U/L   Total Protein 7.2 6.0 - 8.3 g/dL   Albumin 4.4 3.5 - 5.2 g/dL   Calcium 9.3 8.4 - 10.5 mg/dL   GFR 85.49 >60.00 mL/min  Lipase     Status: None   Collection Time: 08/01/17 10:11 AM  Result Value Ref Range   Lipase 18.0 11.0 - 59.0 U/L    US Abdomen Limited Ruq  Result Date: 08/01/2017 CLINICAL DATA:  Two week history of right upper quadrant pain. History of breast carcinoma EXAM: ULTRASOUND ABDOMEN LIMITED RIGHT UPPER QUADRANT COMPARISON:  November 02, 2014 FINDINGS: Gallbladder: No gallstones or wall thickening visualized. There is no pericholecystic fluid. No sonographic Murphy sign noted by sonographer. Common bile duct: Diameter: 2 mm. No intrahepatic or extrahepatic biliary duct dilatation. Liver: No focal lesion identified. Within normal limits in parenchymal echogenicity. Portal vein is patent on color Doppler imaging with normal direction of blood flow towards the liver. IMPRESSION: Study within normal limits. Electronically Signed   By: Lowella Grip III M.D.   On: 08/01/2017 11:23   Assessment/Plan:  RUQ pain - Plan: CBC, Comprehensive metabolic panel, Lipase, US Abdomen Limited RUQ S: right upper quadrant pain for 2 weeks. Constant pain at this point can get up to 6/10 and as low 3/10. Seems to get worse as day goes along. Has not taken tylenol or anything. Ibuprofen worsens her  aseline tinnitus. No vomiting or fever. Has had some mild nausea. Denies foods making this pain worse. Never had anything like this beofre. Still has her gallbladder.   Normal bowel movements daily and not firm  Has had some increased fatigue. Bloating after eating across abdomen.  A/P: Unclear cause of pain- labwork and RUQ really unremarkable.  Could be MSK in nature but didn't feel obvious muscle spasm. Doubt cardiac with no exertional pain. Possible reflux or gastric ulcer? I am going to have patient journal symptoms and return next week. I would consider trial of PPI if stable to improved. If worsens or has red flags like fever would consider CT abdomen/pelvis.   Depression with anxiety PHQ9 of 6 on medication. GAD7 of 0. She has some clonazepam through Dr. Jana Hakim but she will not be seeing him anymore. She is on effexor 37.5mg  through Dr. Kathline Magic as well. Has had 2 deaths within 2 weeks- grieving. Tiredness for #4 noted related to pollen.   I suspect mild poor control on phq9 is due to grieving on top of baseline depression- will not make any medication adjustments at this time as a result.   Future Appointments  Date Time Provider [place]  10/11/2017  9:30 AM Marin Olp, MD [facility]   Lab/Order associations: RUQ pain - Plan: CBC, Comprehensive metabolic panel, Lipase, US Abdomen Limited RUQ  Return precautions advised.  Garret Reddish, MD

## 2017-08-01 NOTE — Patient Instructions (Signed)
I want to try to get the ultrasound this week so I ordered it as "stat". I will have our team look into getting this scheduled as typically scheduler is out today.   Please stop by lab before you go. Also checking a test on your pancreas  If you have fever, worsening abdominal pain, unable to keep down fluids- we need to see you back immediately

## 2017-08-01 NOTE — Assessment & Plan Note (Signed)
PHQ9 of 6 on medication. GAD7 of 0. She has some clonazepam through Dr. Jana Hakim but she will not be seeing him anymore. She is on effexor 37.5mg  through Dr. Kathline Magic as well. Has had 2 deaths within 2 weeks- grieving. Tiredness for #4 noted related to pollen.   I suspect mild poor control on phq9 is due to grieving on top of baseline depression- will not make any medication adjustments at this time as a result.

## 2017-08-15 ENCOUNTER — Ambulatory Visit: Payer: 59 | Admitting: Neurology

## 2017-08-15 DIAGNOSIS — R42 Dizziness and giddiness: Secondary | ICD-10-CM | POA: Diagnosis not present

## 2017-08-15 DIAGNOSIS — M7918 Myalgia, other site: Secondary | ICD-10-CM

## 2017-08-15 DIAGNOSIS — G500 Trigeminal neuralgia: Secondary | ICD-10-CM | POA: Diagnosis not present

## 2017-08-15 DIAGNOSIS — G43109 Migraine with aura, not intractable, without status migrainosus: Secondary | ICD-10-CM

## 2017-08-15 MED ORDER — METHYLPREDNISOLONE 4 MG PO TBPK: ORAL_TABLET | ORAL | 1 refills | Status: DC

## 2017-08-15 MED ORDER — HYDROCODONE-ACETAMINOPHEN 5-325 MG PO TABS: 1.0000 | ORAL_TABLET | Freq: Four times a day (QID) | ORAL | 0 refills | Status: DC | PRN

## 2017-08-15 MED FILL — METHYLPREDNISOLONE 4 MG TAB: 4 | 6 days supply | Qty: 21 | Fill #0

## 2017-08-15 MED FILL — HYDROCODON-APAP 5-325: 5-325 | 15 days supply | Qty: 60 | Fill #0

## 2017-08-15 NOTE — Progress Notes (Signed)
Xeomin 100 units LOT: [id number] EXP: 03/2019  Bacteriostatic 0.9% Sodium Chloride- 55mL total Lot: [id number] Expiration: 08/29/2018 NDC: 4536-4680-32  Dx: G50 Samples used

## 2017-08-15 NOTE — Progress Notes (Signed)
[facility]    Provider:  Dr Jaynee Eagles Referring Provider: Marin Olp, MD Primary Care Physician:  Marin Olp, MD  CC:  Atypical face pain, likely trigeminal neuralgia  Interval history August 15, 2017: Patient returns today with her husband today.  Her husband provides much information.  She continues to have trigeminal neuralgia and is very hesitant to try medications.  She also continues to have myofascial cervical pain which contributes to her headaches and migraines as well as her trigeminal neuralgia.  She is been to physical therapy and she has had dry needling.  She also has dizziness related to her neck pain discussed cervicogenic dizziness today as well.  Right trigeminal neuralgia she uses Vicodin very sparingly, I did discuss the risks of this long-term and we discussed botulinum toxin for trigeminal neuralgia, reviewed the literature and localization but the risks are definitely facial droop as the trigeminal nerve is close to the facial nerve in the area as we would inject Botox for the trigeminal neuralgia.  For her significant cervical fascial pain we discussed Botox injections there as well and all the side effects which include decreased breathing, death, weakness, difficulty swallowing and other significant side effects.  We will refill her Vicodin.  Also gave her literature on cervicogenic dizziness.  Xeomin SCM 5units bilaterally, Levator Scapulae 5 units bilaterally (posterior to the SCM), 15 units in each of 3 locations in the bilateral trapezius.   Interval history: She has pain 24x7, she is tingling with paresthesias on the right in the V2/V3 distribution, deep at the front of the ear and in the temple and jaw, her molares hurt and are throbbing. She has been to the dentist. She gets "squiggly lines" in the left eye and starting to have pain on the left side as well. She also has sharp, shooting pain, brief, severe on the right in the V2/V3  distribution. She takes feverfew, CoQ10, cumin seed oil, she takes a B complex with B2 in it. Discussed CoQ10. She has pulsatile Tinnitus in the left ear. She has pain in the right ear so difficult to wear hearing aids. She has negative pressure in both ears. She can't get them to pop. When they pop she can hear well and then she has. Discussed dry needling. Pain in her face is like a hot poker and travels down the V2/v3 area severe, pain is continuous.  HPI:  [name] is a 57 y.o. female here as a referral from Dr. Yong Channel for trigeminal neuralgia.  January 2017 in the setting of stress she had a migraine. She had a migraine and as January went on her right temple started hurting. She had a bite guard made. Starts at the ear very deep and it shoots to the right side of the face to the temple and cheek, diffuse numbness. Felt like her ear was underwater. She tried essential oils, herbs, massage and exercise. She went to a chiropractor. She thought she had an ear infection. It can be very painful. Vicadin helps. Ice/heat helps. She also puts on topical magnesium. Initially the pain was constant, they went to [facility]. She tried Lyrica and did not tolerate. She has tried gabapentin in the past and she gets dizzy and off balance. She has a lot of side effects to medications. She is trying herbs like Black cumin, curcumin and boswella seed oil which is helping.  She has improved. Now the weather affects it. Not shooting or searing, a dull deep pain.  Like Novacaine and ear and temple pain. She is having a migraine. No known triggers such as brushing or wind or eating. It is pounding and throbbing. Light does not bother. No sound sensitivity. Daily medications. She feels it is manageable. No other focal neurologic deficits, associated symptoms, inciting events or modifiable factors.  Reviewed notes, labs and imaging from outside physicians, which showed:  MRI brain : Unusually prominent and asymmetric  loop of the RIGHT anterior inferior cerebellar artery projects far into the internal auditory canal, approaching the fundus. Correlate clinically as a cause for pulsatile tinnitus.  Review of Systems: Patient complains of symptoms per HPI as well as the following symptoms: no rash, no lesions, no SOB, no CP. Pertinent negatives and positives per HPI. All others negative.  Social History   Socioeconomic History  . Marital status: Married    Spouse name: Not on file  . Number of children: 0  . Years of education: 10  . Highest education level: Not on file  Occupational History  . Occupation: HAIR [employer]    Employer: [employer]  . Occupation: Scientist, clinical (histocompatibility and immunogenetics)  . Occupation: model  Social Needs  . Financial resource strain: Not on file  . Food insecurity:    Worry: Not on file    Inability: Not on file  . Transportation needs:    Medical: Not on file    Non-medical: Not on file  Tobacco Use  . Smoking status: Never Smoker  . Smokeless tobacco: Never Used  Substance and Sexual Activity  . Alcohol use: Yes    Comment: wine with dinner a few nights a week  . Drug use: No  . Sexual activity: Not on file  Lifestyle  . Physical activity:    Days per week: Not on file    Minutes per session: Not on file  . Stress: Not on file  Relationships  . Social connections:    Talks on phone: Not on file    Gets together: Not on file    Attends religious service: Not on file    Active member of club or organization: Not on file    Attends meetings of clubs or organizations: Not on file    Relationship status: Not on file  . Intimate partner violence:    Fear of current or ex partner: Not on file    Emotionally abused: Not on file    Physically abused: Not on file    Forced sexual activity: Not on file  Other Topics Concern  . Not on file  Social History Narrative   HSG, many types of education no formal degree. Married '85, no children. 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 Strong interest in herbology and alternative healing.    Right-handed   1-2 cups coffee per day.    Family History  Problem Relation Age of Onset  . Seizures Mother   . Migraines Mother   . Hypertension Father   . Other Father   . Hyperlipidemia Father   . Breast cancer Sister   . Cancer Sister        breast  . Cancer Other        breast  . Breast cancer Maternal Grandmother     Past Medical History:  Diagnosis Date  . Adhesive capsulitis of shoulder   . Aortic aneurysm ([place])   . Cancer [facility]) 2008   Breast  . Congenital anomaly of aortic arch   .  Labyrinthitis, unspecified   . Malignant neoplasm of breast (female), unspecified site   . Other B-complex deficiencies   . Other malaise and fatigue   . Unspecified hearing loss   . Unspecified hereditary and idiopathic peripheral neuropathy   . Unspecified tinnitus     Past Surgical History:  Procedure Laterality Date  . ABDOMINAL HYSTERECTOMY  04/2010   robotic  . BASAL CELL CARCINOMA EXCISION  2003   rt eye area  . BREAST FIBROADENOMA SURGERY  1981   Left  . BREAST LUMPECTOMY Right Oct.29 & Mar 19, 2007   Right  . IR GENERIC HISTORICAL  04/16/2016   IR RADIOLOGIST EVAL & MGMT 04/16/2016 [facility]INTER[facility] RAD  . LAPAROSCOPY  1982   for endometriosis  . wisdom teeth extracted      Current Outpatient Medications  Medication Sig Dispense Refill  . Ascorbic Acid (VITAMIN C) 1000 MG tablet Take 1,000 mg by mouth daily.    Marland Kitchen b complex vitamins tablet Take 1 tablet by mouth daily.    Azucena Freed Serrata (BOSWELLIA PO) Take 100 mg by mouth 2 (two) times daily.    . Cholecalciferol (VITAMIN D) 2000 UNITS CAPS Take by mouth daily.    . clonazePAM (KLONOPIN) 0.5 MG tablet TAKE 1 TABLET BY MOUTH TWICE DAILY AS NEEDED ANXIETY 60 tablet 0  . Coenzyme Q10 (COQ10 PO) Take 100 mg by mouth 2 (two) times daily.    . cyclobenzaprine (FLEXERIL) 5 MG tablet Take 1  tablet (5 mg total) by mouth every 8 (eight) hours as needed for muscle spasms. 90 tablet 12  . diphenhydrAMINE (BENADRYL) 50 MG tablet Take 50mg  by mouth one hour before procedure. 30 tablet 0  . HYDROcodone-acetaminophen (NORCO/VICODIN) 5-325 MG tablet Take 1 tablet by mouth every 6 (six) hours as needed for moderate pain. 60 tablet 0  . magnesium oxide (MAG-OX) 400 MG tablet Take 400 mg by mouth daily.    . methylPREDNISolone (MEDROL DOSEPAK) 4 MG TBPK tablet follow package directions 21 tablet 1  . Multiple Vitamin (MULTIVITAMIN) tablet Take 1 tablet by mouth daily.    . Probiotic Product (PROBIOTIC DAILY PO) Take by mouth daily.    . riboflavin (VITAMIN B-2) 100 MG TABS tablet Take 100 mg by mouth daily.    . Turmeric 500 MG CAPS Take 500 mg by mouth 2 (two) times daily.    Marland Kitchen UNABLE TO FIND Take 500 mg by mouth 2 (two) times daily. Black Cumin Seed Oil for pain     . venlafaxine XR (EFFEXOR-XR) 37.5 MG 24 hr capsule Take 1 capsule (37.5 mg total) by mouth daily with breakfast. 90 capsule 4   No current facility-administered medications for this visit.     Allergies as of 08/15/2017 - Review Complete 08/01/2017  Allergen Reaction Noted  . Bee venom Anaphylaxis 12/14/2014  . Other Anaphylaxis   . Polysorbate Anaphylaxis 02/28/2012  . Latex Hives   . Lyrica [pregabalin] Nausea And Vomiting and Other (See Comments) 09/17/2016  . Methylprednisolone Other (See Comments)   . Doxycycline hyclate Rash 09/17/2016    Vitals: There were no vitals taken for this visit. Last Weight:  Wt Readings from Last 1 Encounters:  08/01/17 137 lb 3.2 oz (62.2 kg)   Last Height:   Ht Readings from Last 1 Encounters:  08/01/17 5\' 6"  (1.676 m)   Physical exam: Exam: Gen: NAD, conversant, well nourised, well groomed  CV: RRR, no MRG. No Carotid Bruits. No peripheral edema, warm, nontender Eyes: Conjunctivae clear without exudates or hemorrhage  Neuro: Detailed Neurologic  Exam  Speech:    Speech is normal; fluent and spontaneous with normal comprehension.  Cognition:    The patient is oriented to person, place, and time;     recent and remote memory intact;     language fluent;     normal attention, concentration,     fund of knowledge Cranial Nerves:    The pupils are equal, round, and reactive to light. The fundi are normal and spontaneous venous pulsations are present. Visual fields are full to finger confrontation. Extraocular movements are intact. Trigeminal sensation is intact and the muscles of mastication are normal. The face is symmetric. The palate elevates in the midline. Hearing intact. Voice is normal. Shoulder shrug is normal. The tongue has normal motion without fasciculations.   Coordination:    Normal finger to nose and heel to shin. Normal rapid alternating movements.   Gait:    Heel-toe and tandem gait are normal.   Motor Observation:    No asymmetry, no atrophy, and no involuntary movements noted. Tone:    Normal muscle tone.    Posture:    Posture is normal. normal erect    Strength:    Strength is V/V in the upper and lower limbs.      Sensation: intact to LT     Reflex Exam:  DTR's:    Deep tendon reflexes in the upper and lower extremities are normal bilaterally.   Toes:    The toes are downgoing bilaterally.   Clonus:    Clonus is absent.     Assessment/Plan:  Patient with atypical facial pain in the trigeminal nerve distribution. MRI showed a vascular loop possibly compressing the trigeminal nerve at the origin.  - Trigeminal neuralgia vs Migraines or both also myofascial cervical pain and cervicogenic dizziness - Discussed dry needling, she did go to PT for this - Continue Cefaly for migraines - she returns today, di dno thelp - She is not interested in daily medications, she tried Clinical research associate and Gabapentin and had significant side effects, recommend Tegretol or Trileptal.Continue Vicodin at this time very  sparingly - Trigeminal and Trigger point injections, botox in the cervical muscles  As far as your medications are concerned for trigeminal neuralgia and migraines, I would like to suggest:  Vicodin as needed for pain very sparingly flexeril up to three times a day for the neck pain and the trigeminal neuralgia (Baclofen did not help) Recommendations include: Nerve blocks (marcaine and lidocaine), or Botox(Xeomin is preservative free as she is allergic) or radiofrequency ablation. She is not interested in decompression therapy for the vascular loop seen on MRI. She will discuss with husband and let us know.  Provided education on trigeminal neuralgia and migraines, cervicogenic dizziness.Provided extensive reading material and sites online for research and education.   Performed by Dr. Jaynee Eagles M.D. All procedures a documented were medically necessary, reasonable and appropriate based on the patient's history, medical diagnosis and physician opinion. Verbal informed consent was obtained from the patient, patient was informed of potential risk of procedure, including bruising, bleeding, hematoma formation, infection, muscle weakness, muscle pain, numbness, transient hypertension, transient hyperglycemia and transient insomnia among others. All areas injected were topically clean with isopropyl rubbing alcohol. Nonsterile nonlatex gloves were worn during the procedure.  1. [id number]: SCM bilaterally, Levator Scapulaebilaterally (posterior to the SCM),  bilateral trapezius.    Berta Minor  Jaynee Eagles, [facility] [address] [place], [place] [address]  Phone [phone] Fax [phone]  A total of 25 minutes was spent face-to-face with this patient. Over half this time was spent on counseling patient on the Trigeminal neuralgia, myofascial cervical pain, migraine, cervicogenic dizziness diagnosis and different diagnostic and therapeutic options available. This does not  include time spent on nerve blocks

## 2017-08-19 ENCOUNTER — Encounter: Payer: Self-pay | Admitting: Family Medicine

## 2017-09-13 MED FILL — VENLAFAXINE HCL ER 37.5 MG: 37.5 | 90 days supply | Qty: 90 | Fill #2

## 2017-10-11 ENCOUNTER — Encounter: Payer: Self-pay | Admitting: Family Medicine

## 2017-10-11 ENCOUNTER — Ambulatory Visit (INDEPENDENT_AMBULATORY_CARE_PROVIDER_SITE_OTHER): Payer: 59 | Admitting: Family Medicine

## 2017-10-11 VITALS — BP 118/78 | HR 64 | Temp 97.9°F | Ht 66.0 in | Wt 136.6 lb

## 2017-10-11 DIAGNOSIS — Z79899 Other long term (current) drug therapy: Secondary | ICD-10-CM

## 2017-10-11 DIAGNOSIS — T63444D Toxic effect of venom of bees, undetermined, subsequent encounter: Secondary | ICD-10-CM | POA: Diagnosis not present

## 2017-10-11 DIAGNOSIS — F418 Other specified anxiety disorders: Secondary | ICD-10-CM | POA: Diagnosis not present

## 2017-10-11 DIAGNOSIS — E559 Vitamin D deficiency, unspecified: Secondary | ICD-10-CM | POA: Diagnosis not present

## 2017-10-11 DIAGNOSIS — R9089 Other abnormal findings on diagnostic imaging of central nervous system: Secondary | ICD-10-CM | POA: Diagnosis not present

## 2017-10-11 DIAGNOSIS — I719 Aortic aneurysm of unspecified site, without rupture: Secondary | ICD-10-CM | POA: Diagnosis not present

## 2017-10-11 DIAGNOSIS — Z0000 Encounter for general adult medical examination without abnormal findings: Secondary | ICD-10-CM

## 2017-10-11 DIAGNOSIS — E785 Hyperlipidemia, unspecified: Secondary | ICD-10-CM | POA: Diagnosis not present

## 2017-10-11 DIAGNOSIS — I7121 Aneurysm of the ascending aorta, without rupture: Secondary | ICD-10-CM

## 2017-10-11 DIAGNOSIS — T63441A Toxic effect of venom of bees, accidental (unintentional), initial encounter: Secondary | ICD-10-CM | POA: Insufficient documentation

## 2017-10-11 LAB — LIPID PANEL
CHOL/HDL RATIO: 4
Cholesterol: 223 mg/dL — ABNORMAL HIGH (ref 0–200)
HDL: 61.8 mg/dL (ref >39.00)
LDL Cholesterol: 140 mg/dL — ABNORMAL HIGH (ref 0–99)
NonHDL: 160.89
Triglycerides: 104 mg/dL (ref 0.0–149.0)
VLDL: 20.8 mg/dL (ref 0.0–40.0)

## 2017-10-11 LAB — CBC
HEMATOCRIT: 44.3 % (ref 36.0–46.0)
HEMOGLOBIN: 15.2 g/dL — AB (ref 12.0–15.0)
MCHC: 34.2 g/dL (ref 30.0–36.0)
MCV: 91.9 fl (ref 78.0–100.0)
PLATELETS: 326 10*3/uL (ref 150.0–400.0)
RBC: 4.82 Mil/uL (ref 3.87–5.11)
RDW: 13 % (ref 11.5–15.5)
WBC: 4.9 10*3/uL (ref 4.0–10.5)

## 2017-10-11 LAB — COMPREHENSIVE METABOLIC PANEL
ALT: 18 U/L (ref 0–35)
AST: 19 U/L (ref 0–37)
Albumin: 4.6 g/dL (ref 3.5–5.2)
Alkaline Phosphatase: 63 U/L (ref 39–117)
BILIRUBIN TOTAL: 0.6 mg/dL (ref 0.2–1.2)
BUN: 19 mg/dL (ref 6–23)
CALCIUM: 9.7 mg/dL (ref 8.4–10.5)
CO2: 30 meq/L (ref 19–32)
Chloride: 101 mEq/L (ref 96–112)
Creatinine, Ser: 0.76 mg/dL (ref 0.40–1.20)
GFR: 82.84 mL/min (ref >60.00)
Glucose, Bld: 102 mg/dL — ABNORMAL HIGH (ref 70–99)
POTASSIUM: 5 meq/L (ref 3.5–5.1)
Sodium: 139 mEq/L (ref 135–145)
Total Protein: 6.9 g/dL (ref 6.0–8.3)

## 2017-10-11 LAB — MAGNESIUM: Magnesium: 2.2 mg/dL (ref 1.5–2.5)

## 2017-10-11 LAB — VITAMIN D 25 HYDROXY (VIT D DEFICIENCY, FRACTURES): VITD: 30.26 ng/mL (ref 30.00–100.00)

## 2017-10-11 MED ORDER — EPINEPHRINE 0.3 MG/0.3ML IJ SOAJ: 0.3000 mg | Freq: Once | INTRAMUSCULAR | 0 refills | Status: DC

## 2017-10-11 MED FILL — EPINEPHRINE 0.3 MG AUTO-INJ: 0.3 | 2 days supply | Qty: 2 | Fill #0

## 2017-10-11 NOTE — Assessment & Plan Note (Signed)
S: mild poor control on effexor 37.5mg  through Dr. Jaynee Eagles at last visit with score of 6- now improved to 1 today- was dealing with grieving at last visit 2 deaths in 2 weeks.   Uses sparing clonazepam for anxiety- gad7 of 0 today. Maybe once a month A/P: continue current meds

## 2017-10-11 NOTE — Patient Instructions (Addendum)
Sorry we are out of shingrix- Please check with your pharmacy to see if they have the shingrix vaccine. If they do- please get this immunization and update [place] by phone call or mychart with dates you receive the vaccine  Refilled epi pen  No changes today  Please stop by lab before you go

## 2017-10-11 NOTE — Progress Notes (Signed)
Phone: [phone]  Subjective:  Patient presents today for their annual physical. Chief complaint-noted.   See problem oriented charting- ROS- full  review of systems was completed and negative except for: many issues working with Dr. Jaynee Eagles on- ear pain, hearing loss, post nasal drip, tinnitus, ear itching, seasonal allergies, lightheaded at times, numbness, neck pain, neck stiffness  The following were reviewed and entered/updated in epic: Past Medical History:  Diagnosis Date  . Adhesive capsulitis of shoulder   . Aortic aneurysm ([place])   . Cancer [facility]) 2008   Breast  . Congenital anomaly of aortic arch   . Labyrinthitis, unspecified   . Malignant neoplasm of breast (female), unspecified site   . Other B-complex deficiencies   . Other malaise and fatigue   . Unspecified hearing loss   . Unspecified hereditary and idiopathic peripheral neuropathy   . Unspecified tinnitus    Patient Active Problem List   Diagnosis Date Noted  . Trigeminal neuralgia 09/30/2016    Priority: High  . Migraine with aura and without status migrainosus 09/30/2016    Priority: High  . Abnormal MRA, brain 05/15/2016    Priority: High  . 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 Malignant neoplasm of lower-outer quadrant of right breast of female, estrogen receptor positive ([place]) 08/23/2014    Priority: High  . Tinnitus 01/10/2010    Priority: High  . Hyperlipidemia 11/28/2010    Priority: Medium  . Depression with anxiety 11/28/2010    Priority: Medium  . HEARING LOSS, BILATERAL 01/10/2010    Priority: Medium  . Bee sting-induced anaphylaxis 10/11/2017    Priority: Low  . Sensorineural hearing loss (SNHL) of right ear 05/15/2016    Priority: Low  . Dyspnea 12/14/2014    Priority: Low  . Seasonal and perennial allergic rhinitis 12/14/2014    Priority: Low  . Genetic testing 12/08/2014    Priority: Low  . Loss of transverse plantar arch 07/16/2014    Priority: Low  .  Hereditary and idiopathic peripheral neuropathy 01/10/2010    Priority: Low  . Vitamin B12 deficiency 01/09/2010    Priority: Low  . Fatigue 07/14/2008    Priority: Low  . History of skin cancer 05/26/2007    Priority: Low  . Vitamin D deficiency 10/11/2017   Past Surgical History:  Procedure Laterality Date  . ABDOMINAL HYSTERECTOMY  04/2010   robotic  . BASAL CELL CARCINOMA EXCISION  2003   rt eye area  . BREAST FIBROADENOMA SURGERY  1981   Left  . BREAST LUMPECTOMY Right Oct.29 & Mar 19, 2007   Right  . IR GENERIC HISTORICAL  04/16/2016   IR RADIOLOGIST EVAL & MGMT 04/16/2016 [facility]-INTERV RAD  . LAPAROSCOPY  1982   for endometriosis  . wisdom teeth extracted      Family History  Problem Relation Age of Onset  . Seizures Mother   . Migraines Mother   . Hypertension Father   . Other Father   . Hyperlipidemia Father   . Breast cancer Sister   . Cancer Sister        breast  . Cancer Other        breast  . Breast cancer Maternal Grandmother     Medications- reviewed and updated Current Outpatient Medications  Medication Sig Dispense Refill  . Ascorbic Acid (VITAMIN C) 1000 MG tablet Take 1,000 mg by mouth daily.    Marland Kitchen b complex vitamins tablet Take 1 tablet by mouth daily.    Marland Kitchen  Boswellia Serrata (BOSWELLIA PO) Take 100 mg by mouth 2 (two) times daily.    . Cholecalciferol (VITAMIN D) 2000 UNITS CAPS Take by mouth daily.    . clonazePAM (KLONOPIN) 0.5 MG tablet TAKE 1 TABLET BY MOUTH TWICE DAILY AS NEEDED ANXIETY 60 tablet 0  . Coenzyme Q10 (COQ10 PO) Take 100 mg by mouth 2 (two) times daily.    . cyclobenzaprine (FLEXERIL) 5 MG tablet Take 1 tablet (5 mg total) by mouth every 8 (eight) hours as needed for muscle spasms. 90 tablet 12  . diphenhydrAMINE (BENADRYL) 50 MG tablet Take 50mg  by mouth one hour before procedure. 30 tablet 0  . EPINEPHrine 0.3 mg/0.3 mL IJ SOAJ injection Inject 0.3 mLs (0.3 mg total) into the muscle once for 1 dose. For bee sting allergy 2 Device  0  . HYDROcodone-acetaminophen (NORCO/VICODIN) 5-325 MG tablet Take 1 tablet by mouth every 6 (six) hours as needed for moderate pain. 60 tablet 0  . magnesium oxide (MAG-OX) 400 MG tablet Take 400 mg by mouth daily.    . Multiple Vitamin (MULTIVITAMIN) tablet Take 1 tablet by mouth daily.    . Probiotic Product (PROBIOTIC DAILY PO) Take by mouth daily.    . riboflavin (VITAMIN B-2) 100 MG TABS tablet Take 100 mg by mouth daily.    . Turmeric 500 MG CAPS Take 500 mg by mouth 2 (two) times daily.    Marland Kitchen UNABLE TO FIND Take 500 mg by mouth 2 (two) times daily. Black Cumin Seed Oil for pain     . venlafaxine XR (EFFEXOR-XR) 37.5 MG 24 hr capsule Take 1 capsule (37.5 mg total) by mouth daily with breakfast. 90 capsule 4   No current facility-administered medications for this visit.     Allergies-reviewed and updated Allergies  Allergen Reactions  . Bee Venom Anaphylaxis  . Other Anaphylaxis  . Polysorbate Anaphylaxis    "Polysorbate 80 preservative"  . Latex Hives  . Lyrica [Pregabalin] Nausea And Vomiting and Other (See Comments)    Dizziness   . Methylprednisolone Other (See Comments)    Injection only - has polysorbate 80 preservative  . Doxycycline Hyclate Rash    Social History   Social History Narrative   HS[employer], many types of education no formal degree. Married '85, no children. 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 Strong interest in herbology and alternative healing.    Right-handed   1-2 cups coffee per day.    Objective: BP 118/78 (BP Location: Left Arm, Patient Position: Sitting, Cuff Size: Normal)   Pulse 64   Temp 97.9 F (36.6 C) (Oral)   Ht 5\' 6"  (1.676 m)   Wt 136 lb 9.6 oz (62 kg)   SpO2 97%   BMI 22.05 kg/m  Gen: NAD, resting comfortably HEENT: Mucous membranes are moist. Oropharynx normal Neck: no thyromegaly CV: RRR no murmurs rubs or gallops Lungs: CTAB no crackles, wheeze,  rhonchi Abdomen: soft/nontender/nondistended/normal bowel sounds. No rebound or guarding.  Ext: no edema Skin: warm, dry Neuro: grossly normal, moves all extremities, PERRLA  Assessment/Plan:  57 y.o. female presenting for annual physical.  Health Maintenance counseling: 1. Anticipatory guidance: Patient counseled regarding regular dental exams -q6 months, eye exams - yearly, wearing seatbelts.  2. Risk factor reduction:  Advised patient of need for regular exercise and diet rich and fruits and vegetables to reduce risk of heart attack and stroke. Exercise- yoga 45 minutes every day. Diet-very healthy diet- healthy  weight and drinks plenty of water.  Wt Readings from Last 3 Encounters:  10/11/17 136 lb 9.6 oz (62 kg)  08/01/17 137 lb 3.2 oz (62.2 kg)  05/29/17 135 lb (61.2 kg)  3. Immunizations/screenings/ancillary studies- - We discussed shingrix availability issues  as - I recommended that patient get vaccine at the pharmacy  If she can find it.  Immunization History  Administered Date(s) Administered  . Influenza Whole 03/04/2008, 02/26/2010  . Influenza,inj,Quad PF,6+ Mos 01/28/2014  . Influenza,trivalent, recombinat, inj, PF 02/24/2015  . Influenza-Unspecified 03/03/2013, 01/25/2016  . Td 03/04/2008  4. Cervical cancer screening-  07/02/16 with 3 year follow up planned 5. Breast cancer screening (had breast cancer 2009)-  breast exam with [facility]  and mammogram 11/20/16 6. Colon cancer screening - 11/25/14 with 10 year repeat planned 7. Skin cancer screening- Dr. Allyson Sabal each January. advised regular sunscreen use. Denies worrisome, changing, or new skin lesions.  8. Birth control/STD check-  hysterectomy/monogomous  9. Osteoporosis screening at 89- had bone density with GYN- will discuss with them repeating  Status of chronic or acute concerns   RUQ pain -  better with pepcid for 5 days then stopped. Cut down on tea and doing better  Continues to follow with Dr. Jaynee Eagles for trigeminal  neuralgia an migraines. She is not interested in gamma knife at this time. We reviewedd discussions from [facility] January 2018 as well as Dr. Cathren Laine last note together- encouraged her to follow up.   Wants to check magnesium levels- has been taking supplment  Aortic root aneurysm ([facility]) Aortic root aneurysm- follows with Dr. Cyndia Bent. Last seen in January. 2 year follow up planned  Depression with anxiety S: mild poor control on effexor 37.5mg  through Dr. Jaynee Eagles at last visit with score of 6- now improved to 1 today- was dealing with grieving at last visit 2 deaths in 2 weeks.   Uses sparing clonazepam for anxiety- gad7 of 0 today. Maybe once a month A/P: continue current meds  Bee sting-induced anaphylaxis Needs to have epi pen on hand  Return in about 1 year (around 10/12/2018) for physical.  Lab/Order associations: Preventative health care - Plan: CBC, Comprehensive metabolic panel, Lipid panel  Hyperlipidemia, unspecified hyperlipidemia type - Plan: CBC, Comprehensive metabolic panel, Lipid panel  Vitamin D deficiency - Plan: VITAMIN D 25 Hydroxy (Vit-D Deficiency, Fractures)  High risk medication use - Plan: Magnesium  Meds ordered this encounter  Medications  . EPINEPHrine 0.3 mg/0.3 mL IJ SOAJ injection    Sig: Inject 0.3 mLs (0.3 mg total) into the muscle once for 1 dose. For bee sting allergy    Dispense:  2 Device    Refill:  0   Return precautions advised.  Garret Reddish, MD

## 2017-10-11 NOTE — Assessment & Plan Note (Signed)
Aortic root aneurysm- follows with Dr. Cyndia Bent. Last seen in January. 2 year follow up planned

## 2017-10-11 NOTE — Assessment & Plan Note (Signed)
Needs to have epi pen on hand

## 2017-10-12 NOTE — Progress Notes (Signed)
Your CBC was largely normal (blood counts, infection fighting cells, platelets). Hemoglobin a hair high but better than last check- would check every 6-12 months.  Your CMET was normal (kidney, liver, and electrolytes, blood sugar) othre than hair high blood sugar but better than last year- continue healthy eating, regular exercise.  Your cholesterol was high but not high enough to start medication. Your 10 year risk of heart attack or stroke based on the ASCVD risk calculation is under 3%. This # uses your age, blood pressure, cholesterol, and current medications to determine risk of heart attack or stroke.  No Cholesterol medicine is recommended under 5%. Between 5-7.5% it is recommended to consider cholesterol medicine and above 7.5 % it it is recommended to start statin.  Magnesium is normal. Vitamin D is in normal range over 30

## 2017-10-14 ENCOUNTER — Encounter: Payer: Self-pay | Admitting: Family Medicine

## 2017-11-13 ENCOUNTER — Other Ambulatory Visit: Payer: Self-pay | Admitting: Oncology

## 2017-11-13 DIAGNOSIS — Z1231 Encounter for screening mammogram for malignant neoplasm of breast: Secondary | ICD-10-CM

## 2017-12-04 ENCOUNTER — Other Ambulatory Visit: Payer: Self-pay | Admitting: Neurology

## 2017-12-04 MED ORDER — BUTALBITAL-APAP-CAFFEINE 50-325-40 MG PO TABS: 1.0000 | ORAL_TABLET | Freq: Four times a day (QID) | ORAL | 4 refills | Status: DC | PRN

## 2017-12-04 MED FILL — BUTALB-ACETAMIN-CAFF 50-325: 50-325-40 | 7 days supply | Qty: 30 | Fill #0

## 2017-12-06 ENCOUNTER — Ambulatory Visit
Admission: RE | Admit: 2017-12-06 | Discharge: 2017-12-06 | Disposition: A | Discharge status: Home / Self Care | Payer: 59 | Source: Ambulatory Visit | Attending: Oncology | Admitting: Oncology

## 2017-12-06 DIAGNOSIS — Z1231 Encounter for screening mammogram for malignant neoplasm of breast: Secondary | ICD-10-CM | POA: Diagnosis not present

## 2017-12-06 HISTORY — DX: Personal history of irradiation: Z92.3

## 2017-12-09 MED FILL — VENLAFAXINE HCL ER 37.5 MG: 37.5 | 90 days supply | Qty: 90 | Fill #3

## 2017-12-11 DIAGNOSIS — Z6821 Body mass index (BMI) 21.0-21.9, adult: Secondary | ICD-10-CM | POA: Diagnosis not present

## 2017-12-11 DIAGNOSIS — N898 Other specified noninflammatory disorders of vagina: Secondary | ICD-10-CM | POA: Diagnosis not present

## 2017-12-11 DIAGNOSIS — Z01411 Encounter for gynecological examination (general) (routine) with abnormal findings: Secondary | ICD-10-CM | POA: Diagnosis not present

## 2017-12-11 DIAGNOSIS — Z853 Personal history of malignant neoplasm of breast: Secondary | ICD-10-CM | POA: Diagnosis not present

## 2017-12-11 DIAGNOSIS — E28319 Asymptomatic premature menopause: Secondary | ICD-10-CM | POA: Diagnosis not present

## 2017-12-11 DIAGNOSIS — I719 Aortic aneurysm of unspecified site, without rupture: Secondary | ICD-10-CM | POA: Insufficient documentation

## 2017-12-12 ENCOUNTER — Encounter: Payer: Self-pay | Admitting: Oncology

## 2017-12-16 ENCOUNTER — Other Ambulatory Visit: Payer: Self-pay | Admitting: Oncology

## 2017-12-16 MED ORDER — TAMOXIFEN CITRATE 10 MG PO TABS: 10.0000 mg | ORAL_TABLET | Freq: Every day | ORAL | 4 refills | Status: DC

## 2017-12-16 MED ORDER — TAMOXIFEN CITRATE 10 MG PO TABS: 10.0000 mg | ORAL_TABLET | Freq: Two times a day (BID) | ORAL | 4 refills | Status: DC

## 2017-12-16 MED FILL — TAMOXIFEN CITRATE 10 MG TAB: 10 | 90 days supply | Qty: 90 | Fill #0

## 2017-12-16 NOTE — Progress Notes (Signed)
[name] is interested in using Intrarosa (Prasterone) for vaginal atrophy symptoms.  We discussed the fact that we really have no valuable clinical data.  What we have a state of absorption across the vaginal wall and of course we know that all these hormones are interchangeable.  I did tell her I feel the risk is small but also likely not 0.  On the other hand if she took tamoxifen I think it would be quite safe  After much discussion we decided she is going to take tamoxifen only 10 mg daily and then try the prasterone.  She will let me know if there are any problems from this.

## 2018-01-09 DIAGNOSIS — H9313 Tinnitus, bilateral: Secondary | ICD-10-CM | POA: Diagnosis not present

## 2018-01-09 DIAGNOSIS — Z77122 Contact with and (suspected) exposure to noise: Secondary | ICD-10-CM | POA: Diagnosis not present

## 2018-01-09 DIAGNOSIS — H903 Sensorineural hearing loss, bilateral: Secondary | ICD-10-CM | POA: Diagnosis not present

## 2018-01-30 DIAGNOSIS — H903 Sensorineural hearing loss, bilateral: Secondary | ICD-10-CM | POA: Diagnosis not present

## 2018-02-23 ENCOUNTER — Other Ambulatory Visit: Payer: Self-pay | Admitting: Neurology

## 2018-02-23 MED ORDER — HYDROCODONE-ACETAMINOPHEN 5-325 MG PO TABS: 1.0000 | ORAL_TABLET | Freq: Four times a day (QID) | ORAL | 0 refills | Status: DC | PRN

## 2018-02-24 MED FILL — HYDROCODON-APAP 5-325: 5-325 | 15 days supply | Qty: 60 | Fill #0

## 2018-03-07 ENCOUNTER — Other Ambulatory Visit: Payer: Self-pay | Admitting: Neurology

## 2018-03-07 ENCOUNTER — Other Ambulatory Visit: Payer: Self-pay | Admitting: Oncology

## 2018-03-10 MED FILL — VENLAFAXINE HCL ER 37.5 MG: 37.5 | 60 days supply | Qty: 60 | Fill #0

## 2018-04-08 MED FILL — TAMOXIFEN CITRATE 10 MG TAB: 10 | 90 days supply | Qty: 90 | Fill #1

## 2018-04-14 DIAGNOSIS — H524 Presbyopia: Secondary | ICD-10-CM | POA: Diagnosis not present

## 2018-04-14 DIAGNOSIS — H52203 Unspecified astigmatism, bilateral: Secondary | ICD-10-CM | POA: Diagnosis not present

## 2018-05-13 MED FILL — VENLAFAXINE HCL ER 37.5 MG: 37.5 | 60 days supply | Qty: 60 | Fill #1

## 2018-05-14 MED FILL — SHINGRIX 50 MCG SUS: 50 | 1 days supply | Qty: 1 | Fill #0

## 2018-05-16 DIAGNOSIS — Z85828 Personal history of other malignant neoplasm of skin: Secondary | ICD-10-CM | POA: Diagnosis not present

## 2018-05-16 DIAGNOSIS — D229 Melanocytic nevi, unspecified: Secondary | ICD-10-CM | POA: Diagnosis not present

## 2018-05-16 DIAGNOSIS — L821 Other seborrheic keratosis: Secondary | ICD-10-CM | POA: Diagnosis not present

## 2018-05-16 DIAGNOSIS — I8393 Asymptomatic varicose veins of bilateral lower extremities: Secondary | ICD-10-CM | POA: Diagnosis not present

## 2018-05-16 DIAGNOSIS — L819 Disorder of pigmentation, unspecified: Secondary | ICD-10-CM | POA: Diagnosis not present

## 2018-05-16 DIAGNOSIS — R202 Paresthesia of skin: Secondary | ICD-10-CM | POA: Diagnosis not present

## 2018-05-16 DIAGNOSIS — L814 Other melanin hyperpigmentation: Secondary | ICD-10-CM | POA: Diagnosis not present

## 2018-06-19 ENCOUNTER — Other Ambulatory Visit: Payer: Self-pay | Admitting: Otolaryngology

## 2018-06-19 ENCOUNTER — Ambulatory Visit
Admission: RE | Admit: 2018-06-19 | Discharge: 2018-06-19 | Disposition: A | Discharge status: Home / Self Care | Payer: 59 | Source: Ambulatory Visit | Attending: Otolaryngology | Admitting: Otolaryngology

## 2018-06-19 DIAGNOSIS — H6523 Chronic serous otitis media, bilateral: Secondary | ICD-10-CM | POA: Diagnosis not present

## 2018-06-19 DIAGNOSIS — J0120 Acute ethmoidal sinusitis, unspecified: Secondary | ICD-10-CM | POA: Diagnosis not present

## 2018-06-19 DIAGNOSIS — B370 Candidal stomatitis: Secondary | ICD-10-CM | POA: Diagnosis not present

## 2018-06-19 DIAGNOSIS — J320 Chronic maxillary sinusitis: Secondary | ICD-10-CM

## 2018-06-19 DIAGNOSIS — J342 Deviated nasal septum: Secondary | ICD-10-CM | POA: Diagnosis not present

## 2018-06-19 DIAGNOSIS — H903 Sensorineural hearing loss, bilateral: Secondary | ICD-10-CM | POA: Diagnosis not present

## 2018-06-19 DIAGNOSIS — H8113 Benign paroxysmal vertigo, bilateral: Secondary | ICD-10-CM | POA: Diagnosis not present

## 2018-06-19 DIAGNOSIS — J370 Chronic laryngitis: Secondary | ICD-10-CM | POA: Diagnosis not present

## 2018-06-19 DIAGNOSIS — J322 Chronic ethmoidal sinusitis: Secondary | ICD-10-CM | POA: Diagnosis not present

## 2018-06-19 MED FILL — CEFUROXIME AXETIL 250 MG TA: 250 | 10 days supply | Qty: 20 | Fill #0

## 2018-06-19 MED FILL — FLUCONAZOLE 100 MG TAB: 100 | 10 days supply | Qty: 3 | Fill #0

## 2018-06-27 DIAGNOSIS — J3089 Other allergic rhinitis: Secondary | ICD-10-CM | POA: Diagnosis not present

## 2018-06-27 DIAGNOSIS — J301 Allergic rhinitis due to pollen: Secondary | ICD-10-CM | POA: Diagnosis not present

## 2018-07-07 MED FILL — TAMOXIFEN CITRATE 10 MG TAB: 10 | 90 days supply | Qty: 90 | Fill #2

## 2018-07-12 IMAGING — MR MR MRA HEAD W/O CM
10 of 13 series · 29 of 48 positions shown · IV contrast (multihance)
Comparison: MR brain 02/28/2008.

CLINICAL DATA: RIGHT-sided earache that radiates to the RIGHT side
of the face. Pulsatile tinnitus. Symptoms for 1 week. Symptoms worse
in the past few days. History of breast cancer.

EXAM:
MRI HEAD WITHOUT AND WITH CONTRAST
MRA HEAD WITHOUT CONTRAST
TECHNIQUE: Multiplanar, multiecho pulse sequences of the brain and surrounding
structures were obtained without and with intravenous contrast.
Angiographic images of the head were obtained using MRA technique
without contrast.
CONTRAST:  12mL MULTIHANCE GADOBENATE DIMEGLUMINE 529 MG/ML IV SOLN

[Series 3: DWI · axial · 3.0mm · 0.94mm/px · z∈[-7,+125]mm · 7 of 90 slices shown (1 of 2)]
[im 1/90]
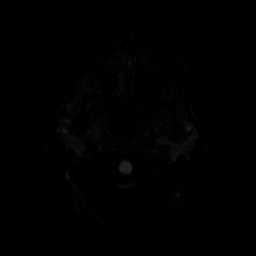
[im 15/90]
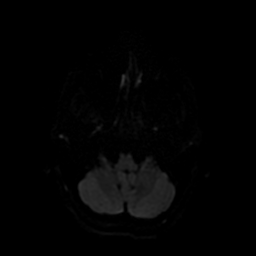
[im 30/90]
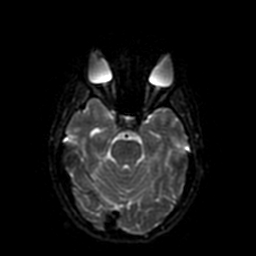
[im 45/90]
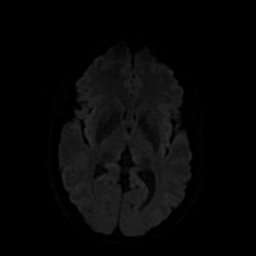
[im 60/90]
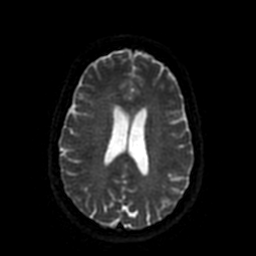
[im 75/90]
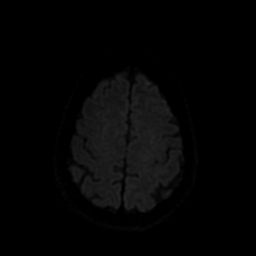
[im 90/90]
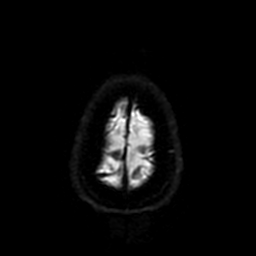

[Series 4: ax (id) 2 · axial · 1.0mm · 0.43mm/px · z∈[-6,+44]mm · 5 of 184 slices shown]
[im 1/184]
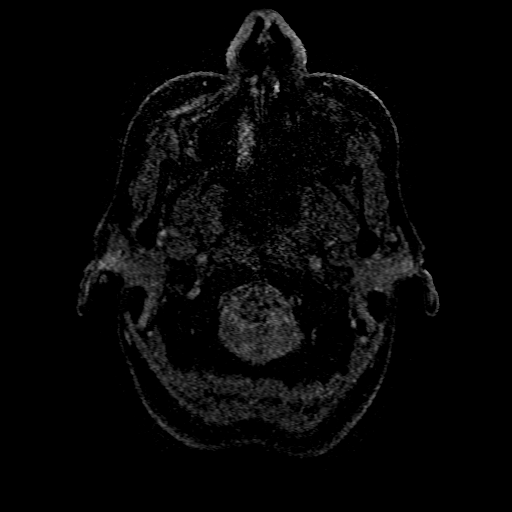
[im 34/184]
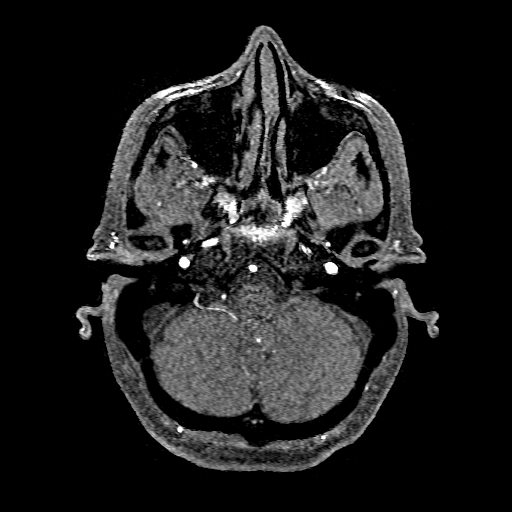
[im 50/184]
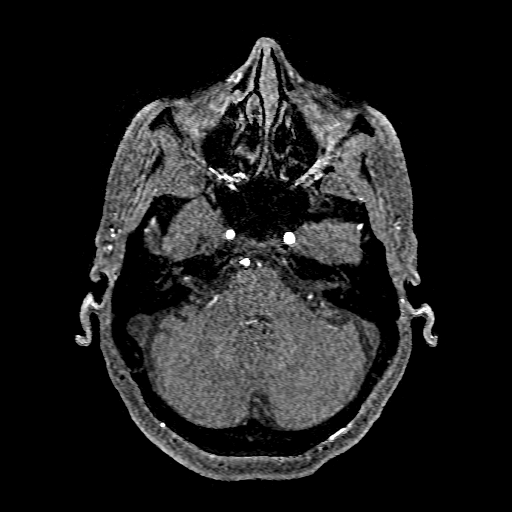
[im 84/184]
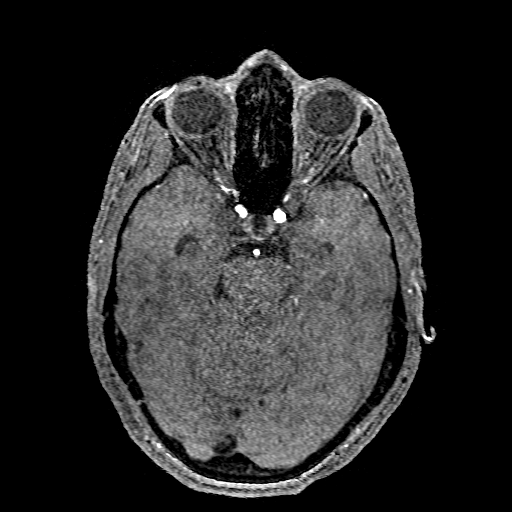
[im 100/184]
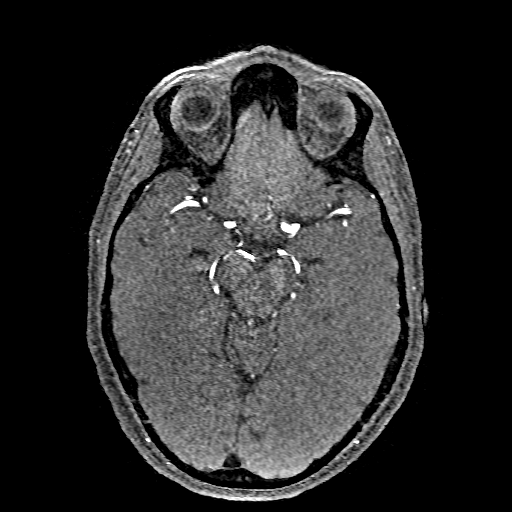

[Series 5: T2 · axial · 5.0mm · 0.47mm/px · 1 of 23 slices shown]
[im 1/23]
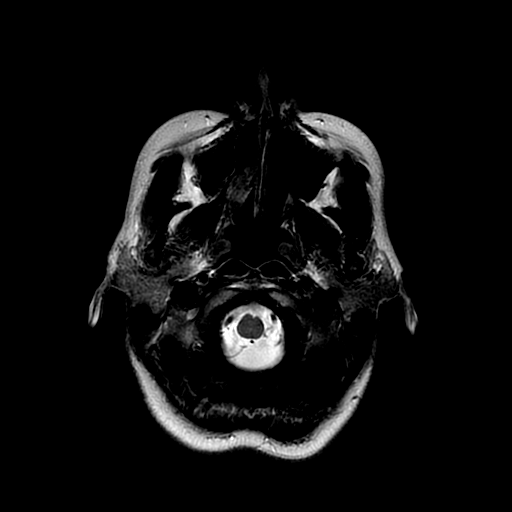

[Series 6: FLAIR · axial · 5.0mm · 0.47mm/px · 1 of 23 slices shown (1 of 2)]
[im 1/23]
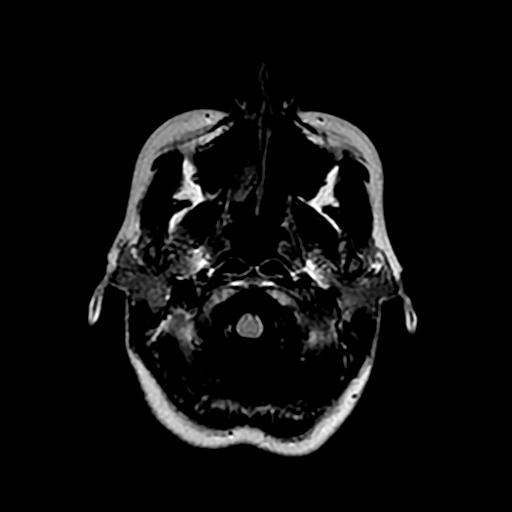

[Series 7: FLAIR · sagittal · 5.0mm · 0.47mm/px · 1 of 21 slices shown (2 of 2)]
[im 1/21]
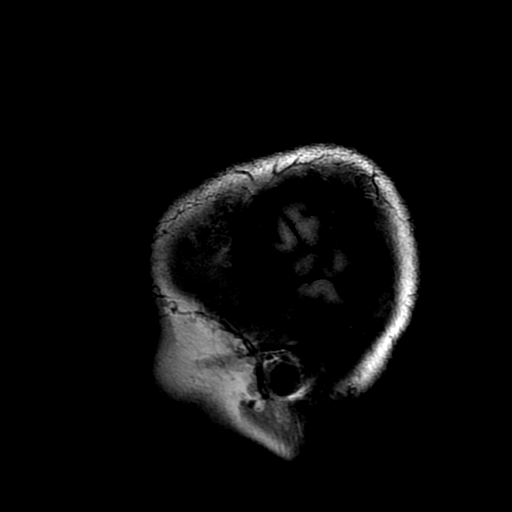

[Series 8: DWI · coronal · 4.0mm · 0.94mm/px · 5 of 72 slices shown (2 of 2)]
[im 1/72]
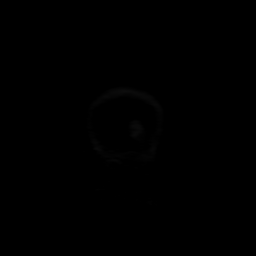
[im 18/72]
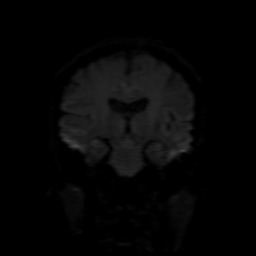
[im 36/72]
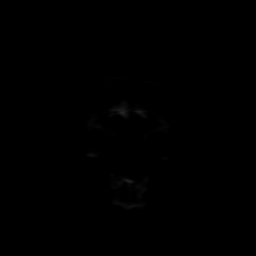
[im 54/72]
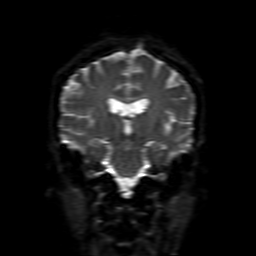
[im 72/72]
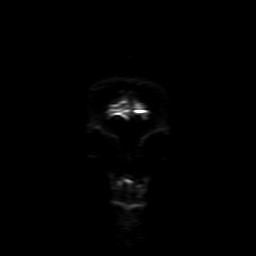

[Series 12: T2 post-contrast · coronal · 5.0mm · 0.39mm/px · 2 of 30 slices shown]
[im 1/30]
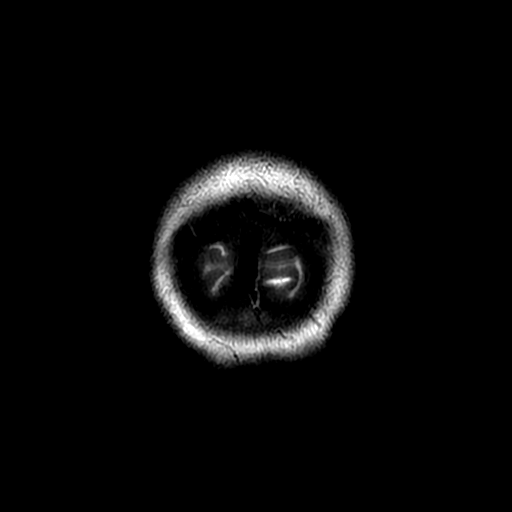
[im 30/30]
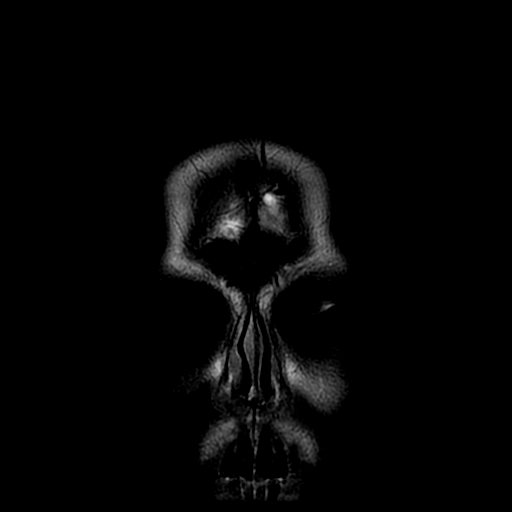

[Series 14: T1 · coronal · 5.0mm · 0.39mm/px · 2 of 30 slices shown]
[im 1/30]
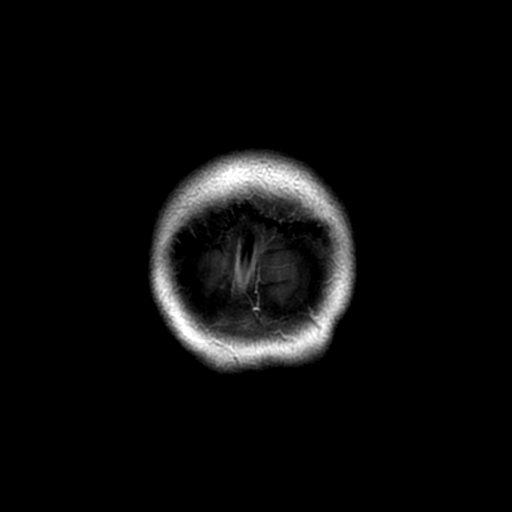
[im 30/30]
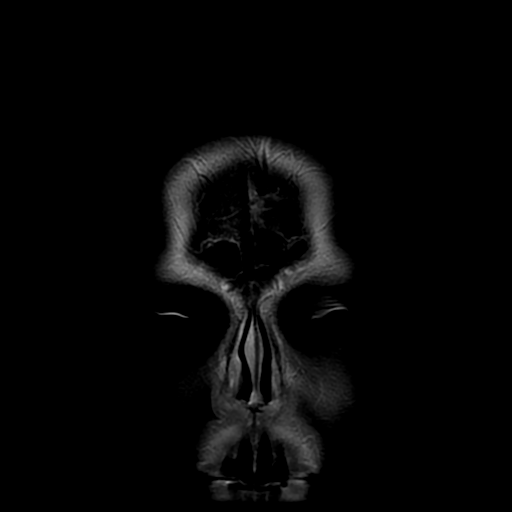

[Series 350: ADC · axial · 3.0mm · 0.94mm/px · z∈[-7,+125]mm · 3 of 45 slices shown (1 of 2)]
[im 1/45]
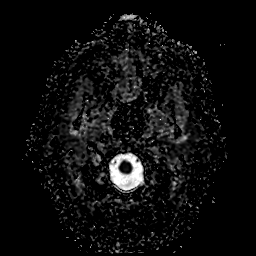
[im 23/45]
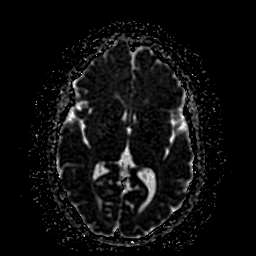
[im 45/45]
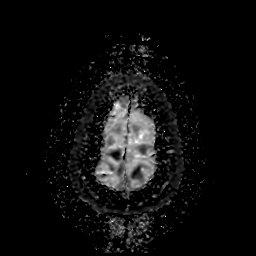

[Series 850: ADC · coronal · 4.0mm · 0.94mm/px · 2 of 36 slices shown (2 of 2)]
[im 1/36]
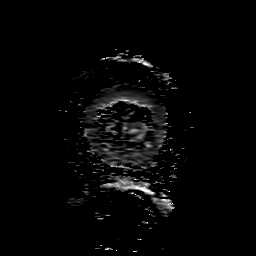
[im 36/36]
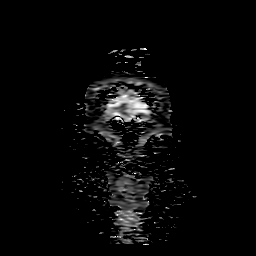

[29 of 48 positions shown; findings below may reference images not displayed]

FINDINGS: MRI HEAD FINDINGS

Brain: No evidence for acute infarction, hemorrhage, mass lesion,
hydrocephalus, or extra-axial fluid. Normal cerebral volume. No
significant white matter disease.

Post infusion, no abnormal enhancement of the brain or meninges.
Incidental LEFT frontal venous angioma drains medially. No evidence
for intracranial metastatic disease.

Vascular: Flow voids are maintained throughout the carotid, basilar,
and vertebral arteries. There are no areas of chronic hemorrhage.
Major dural venous sinuses are patent.

Skull and upper cervical spine: Unremarkable visualized calvarium,
skullbase, and cervical vertebrae. Pituitary, pineal, cerebellar
tonsils unremarkable. No upper cervical cord lesions.

Sinuses/Orbits: No orbital masses or proptosis. Globes appear
symmetric. Sinuses appear well aerated, without evidence for
air-fluid level.

Other: No nasopharyngeal pathology or mastoid fluid. Scalp and other
visualized extracranial soft tissues grossly unremarkable.

MRA HEAD FINDINGS

The internal carotid arteries are widely patent. The LEFT is
dominant due to an atretic RIGHT A1 anterior cerebral artery. Both
anterior cerebrals distally are supplied from the LEFT.

Normal basilar artery.  Vertebrals are codominant.

The M1 middle cerebral artery segments are symmetric and normal.
There is no atherosclerotic change or narrowing at the bifurcation
or beyond.

Fetal origin LEFT posterior cerebral artery with a rudimentary
connection to the basilar. No disease of the posterior cerebral
arteries or visible cerebellar branches.

No saccular aneurysm or arteriovenous malformation is identified.

In the RIGHT internal auditory canal, there is an unusually
asymmetric loop of the anterior inferior cerebellar artery. See
axial source images 47-54, series 4. This loop extends well into the
IAC, nearly to the fundus, and could contribute to pulsatile
tinnitus.

On the LEFT, the AICA does not extend into the IAC, nor is cochlear
nerve displacement established in the CP angle cistern.

With regard to trigeminal symptoms, there does not appear to be a
significant vascular loop at the CN V exit zone on the RIGHT or LEFT
base on axial source images.

Compared with the prior study, the abnormality is better seen on
today's examination.
IMPRESSION: No acute intracranial abnormality. No mass lesion or abnormal
postcontrast enhancement.

Unusually prominent and asymmetric loop of the RIGHT anterior
inferior cerebellar artery projects far into the internal auditory
canal, approaching the fundus. Correlate clinically as a cause for
pulsatile tinnitus.

No intracranial stenosis, or vascular malformation.

## 2018-07-15 ENCOUNTER — Other Ambulatory Visit: Payer: Self-pay | Admitting: Neurology

## 2018-07-15 MED ORDER — HYDROCODONE-ACETAMINOPHEN 5-325 MG PO TABS: 1.0000 | ORAL_TABLET | Freq: Four times a day (QID) | ORAL | 0 refills | Status: DC | PRN

## 2018-07-15 MED ORDER — VENLAFAXINE HCL ER 37.5 MG PO CP24: ORAL_CAPSULE | ORAL | 3 refills | Status: DC

## 2018-07-15 MED FILL — HYDROCODON-APAP 5-325: 5-325 | 15 days supply | Qty: 60 | Fill #0

## 2018-07-15 MED FILL — VENLAFAXINE HCL ER 37.5 MG: 37.5 | 30 days supply | Qty: 30 | Fill #0

## 2018-07-17 ENCOUNTER — Other Ambulatory Visit: Payer: Self-pay | Admitting: Neurology

## 2018-07-17 ENCOUNTER — Other Ambulatory Visit: Payer: Self-pay | Admitting: *Deleted

## 2018-07-17 MED ORDER — VENLAFAXINE HCL 37.5 MG PO TABS: 37.5000 mg | ORAL_TABLET | Freq: Every day | ORAL | 6 refills | Status: DC

## 2018-07-17 MED ORDER — VENLAFAXINE HCL 37.5 MG PO TABS: 37.5000 mg | ORAL_TABLET | Freq: Two times a day (BID) | ORAL | 6 refills | Status: DC

## 2018-07-17 MED FILL — VENLAFAXINE HCL 37.5 MG TAB: 37.5 | 30 days supply | Qty: 30 | Fill #0

## 2018-07-24 ENCOUNTER — Other Ambulatory Visit: Payer: Self-pay | Admitting: Family Medicine

## 2018-07-29 MED FILL — EPINEPHRINE 0.3 MG AUTO-INJ: 0.3 | 1 days supply | Qty: 2 | Fill #0

## 2018-08-25 ENCOUNTER — Telehealth: Payer: 59 | Admitting: Physician Assistant

## 2018-08-25 ENCOUNTER — Encounter: Payer: Self-pay | Admitting: Physician Assistant

## 2018-08-25 DIAGNOSIS — B029 Zoster without complications: Secondary | ICD-10-CM

## 2018-08-25 MED ORDER — ACYCLOVIR 800 MG PO TABS: 800.0000 mg | ORAL_TABLET | Freq: Every day | ORAL | 0 refills | Status: DC

## 2018-08-25 MED FILL — ACYCLOVIR 800 MG TABLET: 800 | 7 days supply | Qty: 35 | Fill #0

## 2018-08-25 NOTE — Progress Notes (Signed)
E Visit for Rash  We are sorry that you are not feeling well. Here is how we plan to help!  [name], Based on what you have shared with me, your rash may be that of shingles. You stated the rash started Sat and today you noticed that the rash is increasing in number (2 next to each other) and is vesicular in appearance. The rash is associated with itching and "discomfort". You have denied pain, scaling of the rash, crusting, swelling, warmth over skin, and denied any rash on the face.   I have prescribed acyclovir 800 mg 5 times daily for 7 days  HOME CARE:   Take cool showers and avoid direct sunlight.  Apply cool compress or wet dressings.  Take a bath in an oatmeal bath.  Sprinkle content of one Aveeno packet under running faucet with comfortably warm water.  Bathe for 15-20 minutes, 1-2 times daily.  Pat dry with a towel. Do not rub the rash.  Use hydrocortisone cream.  Take an antihistamine like Benadryl for widespread rashes that itch.  The adult dose of Benadryl is 25-50 mg by mouth 4 times daily.  Caution:  This type of medication may cause sleepiness.  Do not drink alcohol, drive, or operate dangerous machinery while taking antihistamines.  Do not take these medications if you have prostate enlargement.  Read package instructions thoroughly on all medications that you take.  GET HELP RIGHT AWAY IF:   Symptoms don't go away after treatment.  Severe itching that persists.  If you rash spreads or swells.  If you rash begins to smell.  If it blisters and opens or develops a yellow-brown crust.  You develop a fever.  You have a sore throat.  You become short of breath.  MAKE SURE YOU:  Understand these instructions. Will watch your condition. Will get help right away if you are not doing well or get worse.  Thank you for choosing an e-visit. Your e-visit answers were reviewed by a board certified advanced clinical practitioner to complete your personal care  plan. Depending upon the condition, your plan could have included both over the counter or prescription medications. Please review your pharmacy choice. Be sure that the pharmacy you have chosen is open so that you can pick up your prescription now.  If there is a problem you may message your provider in [place] to have the prescription routed to another pharmacy. Your safety is important to [place]. If you have drug allergies check your prescription carefully.  For the next 24 hours, you can use MyChart to ask questions about today's visit, request a non-urgent call back, or ask for a work or school excuse from your e-visit provider. You will get an email in the next two days asking about your experience. I hope that your e-visit has been valuable and will speed your recovery.     I have spent 7 min in completion and review of this note- Lacy Duverney [facility]

## 2018-09-12 MED FILL — SHINGRIX 50 MCG SUS: 50 | 1 days supply | Qty: 1 | Fill #0

## 2018-10-05 ENCOUNTER — Encounter: Payer: Self-pay | Admitting: Family Medicine

## 2018-10-13 ENCOUNTER — Ambulatory Visit (INDEPENDENT_AMBULATORY_CARE_PROVIDER_SITE_OTHER): Payer: 59 | Admitting: Family Medicine

## 2018-10-13 ENCOUNTER — Encounter: Payer: Self-pay | Admitting: Family Medicine

## 2018-10-13 ENCOUNTER — Other Ambulatory Visit: Payer: Self-pay

## 2018-10-13 VITALS — BP 120/76 | HR 81 | Temp 99.3°F | Ht 66.0 in | Wt 136.0 lb

## 2018-10-13 DIAGNOSIS — G609 Hereditary and idiopathic neuropathy, unspecified: Secondary | ICD-10-CM | POA: Diagnosis not present

## 2018-10-13 DIAGNOSIS — E538 Deficiency of other specified B group vitamins: Secondary | ICD-10-CM | POA: Diagnosis not present

## 2018-10-13 DIAGNOSIS — Z23 Encounter for immunization: Secondary | ICD-10-CM | POA: Diagnosis not present

## 2018-10-13 DIAGNOSIS — F419 Anxiety disorder, unspecified: Secondary | ICD-10-CM

## 2018-10-13 DIAGNOSIS — I719 Aortic aneurysm of unspecified site, without rupture: Secondary | ICD-10-CM | POA: Diagnosis not present

## 2018-10-13 DIAGNOSIS — Z114 Encounter for screening for human immunodeficiency virus [HIV]: Secondary | ICD-10-CM

## 2018-10-13 DIAGNOSIS — I7121 Aneurysm of the ascending aorta, without rupture: Secondary | ICD-10-CM

## 2018-10-13 DIAGNOSIS — L659 Nonscarring hair loss, unspecified: Secondary | ICD-10-CM

## 2018-10-13 DIAGNOSIS — R6883 Chills (without fever): Secondary | ICD-10-CM | POA: Diagnosis not present

## 2018-10-13 DIAGNOSIS — E785 Hyperlipidemia, unspecified: Secondary | ICD-10-CM

## 2018-10-13 DIAGNOSIS — E559 Vitamin D deficiency, unspecified: Secondary | ICD-10-CM | POA: Diagnosis not present

## 2018-10-13 DIAGNOSIS — Z0000 Encounter for general adult medical examination without abnormal findings: Secondary | ICD-10-CM

## 2018-10-13 LAB — LIPID PANEL
Cholesterol: 173 mg/dL (ref 0–200)
HDL: 49.7 mg/dL (ref >39.00)
LDL Cholesterol: 103 mg/dL — ABNORMAL HIGH (ref 0–99)
NonHDL: 123.21
Total CHOL/HDL Ratio: 3
Triglycerides: 99 mg/dL (ref 0.0–149.0)
VLDL: 19.8 mg/dL (ref 0.0–40.0)

## 2018-10-13 LAB — TSH: TSH: 1.46 u[IU]/mL (ref 0.35–4.50)

## 2018-10-13 LAB — COMPREHENSIVE METABOLIC PANEL
ALT: 14 U/L (ref 0–35)
AST: 18 U/L (ref 0–37)
Albumin: 4.5 g/dL (ref 3.5–5.2)
Alkaline Phosphatase: 49 U/L (ref 39–117)
BUN: 21 mg/dL (ref 6–23)
CO2: 28 mEq/L (ref 19–32)
Calcium: 9 mg/dL (ref 8.4–10.5)
Chloride: 102 mEq/L (ref 96–112)
Creatinine, Ser: 0.71 mg/dL (ref 0.40–1.20)
GFR: 84.02 mL/min (ref >60.00)
Glucose, Bld: 97 mg/dL (ref 70–99)
Potassium: 4.2 mEq/L (ref 3.5–5.1)
Sodium: 139 mEq/L (ref 135–145)
Total Bilirubin: 0.6 mg/dL (ref 0.2–1.2)
Total Protein: 6.3 g/dL (ref 6.0–8.3)

## 2018-10-13 LAB — CBC
HCT: 42.8 % (ref 36.0–46.0)
Hemoglobin: 14.7 g/dL (ref 12.0–15.0)
MCHC: 34.2 g/dL (ref 30.0–36.0)
MCV: 91.7 fl (ref 78.0–100.0)
Platelets: 312 10*3/uL (ref 150.0–400.0)
RBC: 4.67 Mil/uL (ref 3.87–5.11)
RDW: 12.9 % (ref 11.5–15.5)
WBC: 5.2 10*3/uL (ref 4.0–10.5)

## 2018-10-13 LAB — VITAMIN B12: Vitamin B-12: 183 pg/mL — ABNORMAL LOW (ref 211–911)

## 2018-10-13 LAB — VITAMIN D 25 HYDROXY (VIT D DEFICIENCY, FRACTURES): VITD: 31.63 ng/mL (ref 30.00–100.00)

## 2018-10-13 MED ORDER — EPINEPHRINE 0.3 MG/0.3ML IJ SOAJ: INTRAMUSCULAR | 0 refills | Status: DC

## 2018-10-13 MED FILL — EPINEPHRINE 0.3 MG AUTO-INJ: 0.3 | 30 days supply | Qty: 2 | Fill #0

## 2018-10-13 NOTE — Progress Notes (Signed)
Per orders of Dr. Yong Channel, injection of Tdap given by Verline Lema L Tyus in left deltoid. Patient tolerated injection well.

## 2018-10-13 NOTE — Patient Instructions (Addendum)
Health Maintenance Due  Topic Date Due  . HIV Screening - today 12/18/1975  . TETANUS/TDAP - today 03/04/2018   Please stop by lab before you go If you do not have mychart- we will call you about results within 5 business days of [place] receiving them.  If you have mychart- we will send your results within 3 business days of [place] receiving them.  If abnormal or we want to clarify a result, we will call or mychart you to make sure you receive the message.  If you have questions or concerns or don't hear within 5-7 days, please send [place] a message or call us.

## 2018-10-13 NOTE — Progress Notes (Signed)
Phone: [phone]  ,  Subjective:  Patient presents today for their annual physical. Chief complaint-noted.   See problem oriented charting- ROS- full  review of systems was completed and negative except for: chills- beyond 2 weeks ago, fatigue, weight change, hearing loss, PND, runny nose, tinnitus, diarrhea yesterday, mild chest tightness- 2 weeks ago, muscle aches, neck pain, hair loss, lightheaded, seasonal allergies  The following were reviewed and entered/updated in epic: Past Medical History:  Diagnosis Date  . Adhesive capsulitis of shoulder   . Aortic aneurysm ([place])   . Cancer [facility]) 2008   Breast  . Congenital anomaly of aortic arch   . Labyrinthitis, unspecified   . Malignant neoplasm of breast (female), unspecified site   . Other B-complex deficiencies   . Other malaise and fatigue   . Personal history of radiation therapy 2008  . Unspecified hearing loss   . Unspecified hereditary and idiopathic peripheral neuropathy   . Unspecified tinnitus    Patient Active Problem List   Diagnosis Date Noted  . Trigeminal neuralgia 09/30/2016    Priority: High  . Migraine with aura and without status migrainosus 09/30/2016    Priority: High  . Abnormal MRA, brain 05/15/2016    Priority: High  . Aortic root aneurysm ([place]) 05/03/2016    Priority: High  . Malignant neoplasm of lower-outer quadrant of right breast of female, estrogen receptor positive ([place]) 08/23/2014    Priority: High  . Tinnitus 01/10/2010    Priority: High  . Vitamin D deficiency 10/11/2017    Priority: Medium  . Hyperlipidemia 11/28/2010    Priority: Medium  . Anxiety 11/28/2010    Priority: Medium  . HEARING LOSS, BILATERAL 01/10/2010    Priority: Medium  . Bee sting-induced anaphylaxis 10/11/2017    Priority: Low  . Sensorineural hearing loss (SNHL) of right ear 05/15/2016    Priority: Low  . Dyspnea 12/14/2014    Priority: Low  . Seasonal and perennial allergic rhinitis 12/14/2014   Priority: Low  . Genetic testing 12/08/2014    Priority: Low  . Loss of transverse plantar arch 07/16/2014    Priority: Low  . Hereditary and idiopathic peripheral neuropathy 01/10/2010    Priority: Low  . Vitamin B12 deficiency 01/09/2010    Priority: Low  . Fatigue 07/14/2008    Priority: Low  . History of skin cancer 05/26/2007    Priority: Low   Past Surgical History:  Procedure Laterality Date  . ABDOMINAL HYSTERECTOMY  04/2010   robotic  . BASAL CELL CARCINOMA EXCISION  2003   rt eye area  . BREAST FIBROADENOMA SURGERY  1981   Left  . BREAST LUMPECTOMY Right Oct.29 & Mar 19, 2007   Right  . IR GENERIC HISTORICAL  04/16/2016   IR RADIOLOGIST EVAL & MGMT 04/16/2016 [facility]-INTERV RAD  . LAPAROSCOPY  1982   for endometriosis  . wisdom teeth extracted      Family History  Problem Relation Age of Onset  . Seizures Mother   . Migraines Mother   . Hypertension Father   . Other Father   . Hyperlipidemia Father   . Breast cancer Sister   . Cancer Sister        breast  . Cancer Other        breast  . Breast cancer Maternal Grandmother     Medications- reviewed and updated Current Outpatient Medications  Medication Sig Dispense Refill  . Ascorbic Acid (VITAMIN C) 1000 MG tablet Take 1,000 mg by mouth daily.    Marland Kitchen  b complex vitamins tablet Take 1 tablet by mouth daily.    Azucena Freed Serrata (BOSWELLIA PO) Take 100 mg by mouth 2 (two) times daily.    . Cholecalciferol (VITAMIN D) 2000 UNITS CAPS Take by mouth daily.    . Coenzyme Q10 (COQ10 PO) Take 100 mg by mouth 2 (two) times daily.    . cyclobenzaprine (FLEXERIL) 5 MG tablet Take 1 tablet (5 mg total) by mouth every 8 (eight) hours as needed for muscle spasms. 90 tablet 12  . diphenhydrAMINE (BENADRYL) 50 MG tablet Take 50mg  by mouth one hour before procedure. 30 tablet 0  . EPINEPHrine 0.3 mg/0.3 mL IJ SOAJ injection INJECT 0.3 MLS INTO THE MUSCLE ONCE FOR 1 DOSE. (FOR BEE STING ALLERGY) 2 Device 0  . magnesium oxide  (MAG-OX) 400 MG tablet Take 400 mg by mouth daily.    . Multiple Vitamin (MULTIVITAMIN) tablet Take 1 tablet by mouth daily.    . Probiotic Product (PROBIOTIC DAILY PO) Take by mouth daily.    . riboflavin (VITAMIN B-2) 100 MG TABS tablet Take 100 mg by mouth daily.    . tamoxifen (NOLVADEX) 10 MG tablet Take 1 tablet (10 mg total) by mouth daily. 90 tablet 4  . Turmeric 500 MG CAPS Take 500 mg by mouth 2 (two) times daily.    Marland Kitchen acyclovir (ZOVIRAX) 800 MG tablet Take 1 tablet (800 mg total) by mouth 5 (five) times daily. 35 tablet 0  . butalbital-acetaminophen-caffeine (FIORICET, ESGIC) 50-325-40 MG tablet Take 1 tablet by mouth every 6 (six) hours as needed for headache. 30 tablet 4  . HYDROcodone-acetaminophen (NORCO/VICODIN) 5-325 MG tablet Take 1 tablet by mouth every 6 (six) hours as needed for moderate pain. 60 tablet 0  . UNABLE TO FIND Take 500 mg by mouth 2 (two) times daily. Black Cumin Seed Oil for pain     . venlafaxine (EFFEXOR) 37.5 MG tablet Take 1 tablet (37.5 mg total) by mouth daily. 30 tablet 6   No current facility-administered medications for this visit.     Allergies-reviewed and updated Allergies  Allergen Reactions  . Bee Venom Anaphylaxis  . Other Anaphylaxis  . Polysorbate Anaphylaxis    "Polysorbate 80 preservative"  . Latex Hives  . Lyrica [Pregabalin] Nausea And Vomiting and Other (See Comments)    Dizziness   . Methylprednisolone Other (See Comments)    Injection only - has polysorbate 80 preservative  . Doxycycline Hyclate Rash    Social History   Social History Narrative   HS[employer], many types of education no formal degree. Married '85, no children. 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 Strong interest in herbology and alternative healing.    Right-handed   1-2 cups coffee per day.   Objective  Objective:  BP 120/76 (BP Location: Left Arm, Patient Position: Sitting, Cuff  Size: Normal)   Pulse 81   Temp 99.3 F (37.4 C) (Oral)   Ht 5\' 6"  (1.676 m)   Wt 136 lb (61.7 kg)   SpO2 98%   BMI 21.95 kg/m  Gen: NAD, resting comfortably HEENT: Mucous membranes are moist. Oropharynx normal Neck: no thyromegaly CV: RRR no murmurs rubs or gallops Lungs: CTAB no crackles, wheeze, rhonchi Abdomen: soft/nontender/nondistended/normal bowel sounds. No rebound or guarding.  Ext: no edema Skin: warm, dry Neuro: grossly normal, moves all extremities, PERRLA   Assessment and Plan    58 y.o. female presenting for annual physical.  Health Maintenance  counseling: 1. Anticipatory guidance: Patient counseled regarding regular dental exams -q6 months, eye exams - yearly,  avoiding smoking and second hand smoke , limiting alcohol to 1 beverage per day .   2. Risk factor reduction:  Advised patient of need for regular exercise and diet rich and fruits and vegetables to reduce risk of heart attack and stroke. Exercise-last year doing yoga daily and gardening hours a day. Diet-reports eating a very healthy diet.  Wt Readings from Last 3 Encounters:  10/13/18 136 lb (61.7 kg)  10/11/17 136 lb 9.6 oz (62 kg)  08/01/17 137 lb 3.2 oz (62.2 kg)  3. Immunizations/screenings/ancillary studies-fully up-to-date other than Tdap which I recommended today- given -Shingrix earlier this year.  Offered HIV screening- shes ok with this . Immunization History  Administered Date(s) Administered  . Influenza Inj Mdck Quad Pf 02/05/2018  . Influenza Whole 03/04/2008, 02/26/2010  . Influenza,inj,Quad PF,6+ Mos 01/28/2014, 01/10/2017  . Influenza,trivalent, recombinat, inj, PF 02/24/2015  . Influenza-Unspecified 03/03/2013, 01/25/2016, 02/05/2018  . Td 03/04/2008  . Zoster Recombinat (Shingrix) 05/14/2018 Actually did not get 2nd shot- then got shingrix and 2nd shot is delayed now   4. Cervical cancer screening- March 2018 with 3-year follow-up planned. Goes yearly and still gets exam.  5.  Breast cancer follow-up (on tamoxifen) with right breast cancer in 2009-  breast exam with [facility] and mammogram 12/06/2017 6. Colon cancer screening - July 2016 with 10-year follow-up planned 7. Skin cancer screening-sees Dr. Allyson Sabal in January. 8. Birth control/STD check- hysterectomy/monogamous 9. Osteoporosis screening at 63-has had bone density with GYN in 2018- in records 10.  Never smoker  Status of chronic or acute concerns   #Trigeminal neuralgia-follows with Dr. Jaynee Eagles for this as well as migraines.  Patient also has vascular loop-there has been consideration of gamma knife procedure in the past-has seen [facility] - migraines and trigeminal neuralgia much better after 6 sessions of acupuncture until recently started back in doing hair and now seems to trigger it - fiorcet at times (half or a quarter but sometimes caffeine makes things worse) , hydrocodone at times  #right breast cancer- remains on tamoxifen after prior breast cancer- was going to be off of this but started dhea supplement and will continue with tamoxifen - on last notes he mentions PCP taking over on rx- with that being said would want formal recommendations from him for how long etc.   #Aortic root aneurysm-follows with Dr. Cyndia Bent every 2 years with last visit 04/2017.  Stable recently  at 3.7 cm with no intervention unless above 5 cm. MRA chest planned 2021  #Depression with anxiety- patient followed by Dr. Jaynee Eagles and is on Effexor 37.5 mg and weaning (down to 1/8 tablet every other day).  Uses sparing clonazepam for anxiety.  - she is off clonazepam  #Bee sting induced anaphylaxis-needs to have EpiPen on hand   #B12 deficiency-takes a B complex.  History of neuropathy that persisted after supplementation.  Update vitamin B12 today   #Vitamin D deficiency-takes 1000 to 2000 units/day.  Update vitamin D today   #social update- self employment as hairstylist   # slight diarrhea yesterday but tried new  probiotic  # hair loss, fatigue, dry skin- wants to check TSH- feels like it was after acyclovir  #lightheadedness recently- when she got dehydrated- encouraged her to stay well hydarted  Advised physical in 1 year Lab/Order associations: protein drink     ICD-10-CM   1. Preventative health care  Z00.00 CBC  Comprehensive metabolic panel    Lipid panel    Vitamin B12    HIV Antibody (routine testing w rflx)    VITAMIN D 25 Hydroxy (Vit-D Deficiency, Fractures)    TSH  2. Hyperlipidemia, unspecified hyperlipidemia type  E78.5 CBC    Comprehensive metabolic panel    Lipid panel  3. Vitamin B12 deficiency  E53.8 Vitamin B12  4. Anxiety  F41.9   5. Screening for HIV (human immunodeficiency virus)  Z11.4 HIV Antibody (routine testing w rflx)  6. Hereditary and idiopathic peripheral neuropathy  G60.9   7. Vitamin D deficiency  E55.9 VITAMIN D 25 Hydroxy (Vit-D Deficiency, Fractures)  8. Aortic root aneurysm ([facility])  I71.9   9. Chills  R68.83 SAR CoV2 Serology (COVID 19)AB(IGG)IA  10. Hair loss  L65.9 TSH    Meds ordered this encounter  Medications  . EPINEPHrine 0.3 mg/0.3 mL IJ SOAJ injection    Sig: INJECT 0.3 MLS INTO THE MUSCLE ONCE FOR 1 DOSE. (FOR BEE STING ALLERGY)    Dispense:  2 Device    Refill:  0    Return precautions advised.  Garret Reddish, MD

## 2018-10-13 NOTE — Addendum Note (Signed)
Addended by: Francis Dowse T on: 10/13/2018 09:24 AM   Modules accepted: Orders

## 2018-10-14 ENCOUNTER — Encounter: Payer: Self-pay | Admitting: Family Medicine

## 2018-10-14 LAB — HIV ANTIBODY (ROUTINE TESTING W REFLEX): HIV 1&2 Ab, 4th Generation: NONREACTIVE

## 2018-10-14 LAB — SAR COV2 SEROLOGY (COVID19)AB(IGG),IA: SARS CoV2 AB IGG: NEGATIVE

## 2018-10-14 MED ORDER — CYANOCOBALAMIN 1000 MCG/ML IJ SOLN: INTRAMUSCULAR | 3 refills | Status: DC

## 2018-10-15 MED FILL — CYANOCOBALAMIN 1,000 MCG/ML: 1000 | 84 days supply | Qty: 6 | Fill #0

## 2018-10-15 MED FILL — BD 3 ML SYRINGE 25GX1": 25G X 1" | 84 days supply | Qty: 6 | Fill #0

## 2018-10-15 MED FILL — BD 3 ML SYRINGE 25GX1: 25G X 1" | 84 days supply | Qty: 6 | Fill #0

## 2018-11-11 ENCOUNTER — Other Ambulatory Visit: Payer: Self-pay | Admitting: Oncology

## 2018-11-11 ENCOUNTER — Other Ambulatory Visit: Payer: Self-pay | Admitting: Obstetrics and Gynecology

## 2018-11-11 DIAGNOSIS — Z1231 Encounter for screening mammogram for malignant neoplasm of breast: Secondary | ICD-10-CM

## 2018-12-21 MED FILL — CYANOCOBALAMIN 1,000 MCG/ML: 1000 | 84 days supply | Qty: 3 | Fill #1

## 2018-12-26 ENCOUNTER — Other Ambulatory Visit: Payer: Self-pay

## 2018-12-26 ENCOUNTER — Ambulatory Visit
Admission: RE | Admit: 2018-12-26 | Discharge: 2018-12-26 | Disposition: A | Discharge status: Home / Self Care | Payer: 59 | Source: Ambulatory Visit | Attending: Obstetrics and Gynecology | Admitting: Obstetrics and Gynecology

## 2018-12-26 DIAGNOSIS — Z1231 Encounter for screening mammogram for malignant neoplasm of breast: Secondary | ICD-10-CM | POA: Diagnosis not present

## 2018-12-26 HISTORY — DX: Personal history of antineoplastic chemotherapy: Z92.21

## 2018-12-29 MED FILL — SHINGRIX 50 MCG SUS: 50 | 1 days supply | Qty: 1 | Fill #1

## 2019-01-26 DIAGNOSIS — L648 Other androgenic alopecia: Secondary | ICD-10-CM | POA: Diagnosis not present

## 2019-01-26 DIAGNOSIS — E559 Vitamin D deficiency, unspecified: Secondary | ICD-10-CM | POA: Diagnosis not present

## 2019-01-26 DIAGNOSIS — L659 Nonscarring hair loss, unspecified: Secondary | ICD-10-CM | POA: Diagnosis not present

## 2019-01-26 DIAGNOSIS — D171 Benign lipomatous neoplasm of skin and subcutaneous tissue of trunk: Secondary | ICD-10-CM | POA: Diagnosis not present

## 2019-01-26 DIAGNOSIS — Z01411 Encounter for gynecological examination (general) (routine) with abnormal findings: Secondary | ICD-10-CM | POA: Diagnosis not present

## 2019-01-26 DIAGNOSIS — N898 Other specified noninflammatory disorders of vagina: Secondary | ICD-10-CM | POA: Diagnosis not present

## 2019-01-26 DIAGNOSIS — Z853 Personal history of malignant neoplasm of breast: Secondary | ICD-10-CM | POA: Diagnosis not present

## 2019-01-26 DIAGNOSIS — Z9079 Acquired absence of other genital organ(s): Secondary | ICD-10-CM | POA: Diagnosis not present

## 2019-01-26 DIAGNOSIS — Z6821 Body mass index (BMI) 21.0-21.9, adult: Secondary | ICD-10-CM | POA: Diagnosis not present

## 2019-02-05 MED FILL — FLUARIX QUADRIVALENT 0.5 ML: 0.5 | 1 days supply | Qty: 1 | Fill #0

## 2019-02-09 DIAGNOSIS — D171 Benign lipomatous neoplasm of skin and subcutaneous tissue of trunk: Secondary | ICD-10-CM | POA: Diagnosis not present

## 2019-02-12 DIAGNOSIS — D179 Benign lipomatous neoplasm, unspecified: Secondary | ICD-10-CM | POA: Insufficient documentation

## 2019-02-27 ENCOUNTER — Ambulatory Visit: Payer: 59 | Admitting: Family Medicine

## 2019-03-06 ENCOUNTER — Encounter: Payer: Self-pay | Admitting: Family Medicine

## 2019-03-06 ENCOUNTER — Ambulatory Visit: Payer: 59 | Admitting: Family Medicine

## 2019-03-06 ENCOUNTER — Other Ambulatory Visit: Payer: Self-pay

## 2019-03-06 VITALS — BP 126/86 | HR 74 | Temp 97.8°F | Ht 66.0 in | Wt 137.8 lb

## 2019-03-06 DIAGNOSIS — D171 Benign lipomatous neoplasm of skin and subcutaneous tissue of trunk: Secondary | ICD-10-CM

## 2019-03-06 DIAGNOSIS — Z711 Person with feared health complaint in whom no diagnosis is made: Secondary | ICD-10-CM

## 2019-03-06 NOTE — Progress Notes (Signed)
Phone [phone] In person visit   Subjective:   [name] is a 58 y.o. year old very pleasant female patient who presents for/with See problem oriented charting Chief Complaint  Patient presents with  . Follow-up  . discuss lipoma and labs   ROS- no fever/chills/cough/congestion.  Some tenderness over lipoma on back  Past Medical History-  Patient Active Problem List   Diagnosis Date Noted  . Trigeminal neuralgia 09/30/2016    Priority: High  . Migraine with aura and without status migrainosus 09/30/2016    Priority: High  . Abnormal MRA, brain 05/15/2016    Priority: High  . Aortic root aneurysm ([place]) 05/03/2016    Priority: High  . Malignant neoplasm of lower-outer quadrant of right breast of female, estrogen receptor positive ([place]) 08/23/2014    Priority: High  . Tinnitus 01/10/2010    Priority: High  . Vitamin D deficiency 10/11/2017    Priority: Medium  . Hyperlipidemia 11/28/2010    Priority: Medium  . Anxiety 11/28/2010    Priority: Medium  . HEARING LOSS, BILATERAL 01/10/2010    Priority: Medium  . Bee sting-induced anaphylaxis 10/11/2017    Priority: Low  . Sensorineural hearing loss (SNHL) of right ear 05/15/2016    Priority: Low  . Dyspnea 12/14/2014    Priority: Low  . Seasonal and perennial allergic rhinitis 12/14/2014    Priority: Low  . Genetic testing 12/08/2014    Priority: Low  . Loss of transverse plantar arch 07/16/2014    Priority: Low  . Hereditary and idiopathic peripheral neuropathy 01/10/2010    Priority: Low  . Vitamin B12 deficiency 01/09/2010    Priority: Low  . Fatigue 07/14/2008    Priority: Low  . History of skin cancer 05/26/2007    Priority: Low    Medications- reviewed and updated Current Outpatient Medications  Medication Sig Dispense Refill  . b complex vitamins tablet Take 1 tablet by mouth daily.    . Cholecalciferol (VITAMIN D) 2000 UNITS CAPS Take by mouth daily.    . Coenzyme Q10 (COQ10 PO) Take 100  mg by mouth 2 (two) times daily.    . cyanocobalamin (,VITAMIN B-12,) 1000 MCG/ML injection 1000 mcg (1 mg) injection once per week for four weeks, followed by 1000 mcg injection once per month. 10 mL 3  . EPINEPHrine 0.3 mg/0.3 mL IJ SOAJ injection INJECT 0.3 MLS INTO THE MUSCLE ONCE FOR 1 DOSE. (FOR BEE STING ALLERGY) 2 Device 0  . magnesium oxide (MAG-OX) 400 MG tablet Take 400 mg by mouth daily.    . Multiple Vitamin (MULTIVITAMIN) tablet Take 1 tablet by mouth daily.    . Probiotic Product (PROBIOTIC DAILY PO) Take by mouth daily.    . tamoxifen (NOLVADEX) 10 MG tablet Take 1 tablet (10 mg total) by mouth daily. 90 tablet 4  . Boswellia Serrata (BOSWELLIA PO) Take 100 mg by mouth 2 (two) times daily.    . Turmeric 500 MG CAPS Take 500 mg by mouth 2 (two) times daily.    Marland Kitchen UNABLE TO FIND Take 500 mg by mouth 2 (two) times daily. Black Cumin Seed Oil for pain      No current facility-administered medications for this visit.      Objective:  BP 126/86   Pulse 74   Temp 97.8 F (36.6 C)   Ht 5\' 6"  (1.676 m)   Wt 137 lb 12.8 oz (62.5 kg)   SpO2 96%   BMI 22.24 kg/m  Gen: NAD, resting  comfortably, appears younger than stated age CV: RRR no murmurs rubs or gallops Lungs: CTAB no crackles, wheeze, rhonchi Ext: no edema Skin: warm, dry MSK: Some mild muscular tenderness in upper back between shoulder blades. 4 x 3 cm lipoma right mid back-freely mobile and not particularly tender.  A portion of this is at the bottom of her bra strap    Assessment and Plan  Discuss lipoma  S:Pt states she has a lipoma on her right side. Started swimming at neighbors pool in June. When putting suit on noted more puching out on right side- saw a lump when she was looking. Saw gynecologist who noted as lipoma- Dr. Rosendo Gros saw her after GYN consult was requested. Only time that he could take it out was right around thanksgiving but she had some concerns about others that were around her. Occasional mild  burning pain- very rarely wakes her up - has to lay on it the right way.   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  A/P: Lipoma noted on right mid back today-freely mobile.  Reassurance was provided-I think it is completely reasonable to continue to monitor this and push back data for removal  #Laboratory concern- 1.  Ferritin 268 ng/ml through gynecology and she was concerned-I reviewed our reference range and she is in normal range 2.  Apparently had testosterone checked due to hair loss including in the groin and axilla.  Her TSH has been normal.  Patient does have a history of bilateral oophorectomy and that is the likely cause of reduced testosterone.  I doubt a significant case of hypopituitarism (no ovary for pituitary to affect) or lack of adrenal gland production  #Musculoskeletal concern 1.Muscle pain in back, Possible trigeminal neuralgia (and where mask pinches)- taking magnesium, pepperment  Near area where has headaches, massage in back.   #GI concern-Mild epigastric pain if eats late-we discussed possible mild GERD  #Elevated blood pressure-initial diastolic blood pressure slightly high at 90 but improved on repeat and she reports blood pressure readings at home 110/70s at home.  Reassurance was provided  Recommended follow up: As needed for acute issues-otherwise scheduled for physical next year Future Appointments  Date Time Provider [place]  10/14/2019  8:00 AM Marin Olp, MD [facility]   Lab/Order associations:   ICD-10-CM   1. Lipoma of torso  D17.1   2. Physically well but worried  Z71.1     Time Stamp The duration of face-to-face time during this visit was greater than 25 minutes (3 50-4 21 PM). Greater than 50% of this time was spent in counseling, explanation of diagnosis, planning of further management, and/or coordination of care including discussion of  concern of lipoma, discussion of concern of her prior labs, discussion of concern about pelvic pain laying of mask wearing.,  Discussion about blood pressure goals.    Return precautions advised.  Garret Reddish, MD

## 2019-03-06 NOTE — Patient Instructions (Addendum)
  Lipoma is reasonable to continue to watch  I am not particularly concerned about ferritin level which is in our normal range for our testing (different cut offs for different labs)  Suspect low testosterone is related to oophorectomy (removal of ovaries). No pattern at present to suggest need for further workup.

## 2019-04-16 ENCOUNTER — Other Ambulatory Visit: Payer: Self-pay | Admitting: Surgery

## 2019-04-16 DIAGNOSIS — I712 Thoracic aortic aneurysm, without rupture, unspecified: Secondary | ICD-10-CM

## 2019-04-27 DIAGNOSIS — H5203 Hypermetropia, bilateral: Secondary | ICD-10-CM | POA: Diagnosis not present

## 2019-04-30 ENCOUNTER — Encounter: Payer: Self-pay | Admitting: Family Medicine

## 2019-05-02 ENCOUNTER — Other Ambulatory Visit: Payer: Self-pay | Admitting: Neurology

## 2019-05-02 MED ORDER — HYDROCODONE-ACETAMINOPHEN 5-325 MG PO TABS: 1.0000 | ORAL_TABLET | Freq: Four times a day (QID) | ORAL | 0 refills | Status: DC | PRN

## 2019-05-02 MED FILL — HYDROCODON-APAP 5-325: 5-325 | 5 days supply | Qty: 40 | Fill #0

## 2019-05-04 NOTE — Telephone Encounter (Signed)
Please call pt and schedule a virtual visit with Dr. Yong Channel. If he is not available please officer another provider. Thanks

## 2019-05-05 ENCOUNTER — Ambulatory Visit (INDEPENDENT_AMBULATORY_CARE_PROVIDER_SITE_OTHER): Payer: 59 | Admitting: Family Medicine

## 2019-05-05 ENCOUNTER — Encounter: Payer: Self-pay | Admitting: Family Medicine

## 2019-05-05 VITALS — Ht 66.0 in | Wt 133.0 lb

## 2019-05-05 DIAGNOSIS — F4321 Adjustment disorder with depressed mood: Secondary | ICD-10-CM | POA: Diagnosis not present

## 2019-05-05 DIAGNOSIS — E538 Deficiency of other specified B group vitamins: Secondary | ICD-10-CM | POA: Diagnosis not present

## 2019-05-05 MED ORDER — CYANOCOBALAMIN 1000 MCG/ML IJ SOLN: INTRAMUSCULAR | 3 refills | Status: DC

## 2019-05-05 MED ORDER — BUSPIRONE HCL 5 MG PO TABS: 5.0000 mg | ORAL_TABLET | Freq: Two times a day (BID) | ORAL | 1 refills | Status: DC | PRN

## 2019-05-05 MED ORDER — ESCITALOPRAM OXALATE 5 MG PO TABS: 5.0000 mg | ORAL_TABLET | Freq: Every day | ORAL | 5 refills | Status: DC

## 2019-05-05 MED FILL — CYANOCOBALAMIN 1,000 MCG/ML: 1000 | 84 days supply | Qty: 3 | Fill #0

## 2019-05-05 MED FILL — busPIRone HCL 5 MG TABS: 5 | 15 days supply | Qty: 30 | Fill #0

## 2019-05-05 MED FILL — ESCITALOPRAM 5 MG TABLET: 5 | 30 days supply | Qty: 30 | Fill #0

## 2019-05-05 NOTE — Progress Notes (Addendum)
Phone [phone] Virtual visit via Video note   Subjective:  Chief complaint: Chief Complaint  Patient presents with  . Follow-up    This visit type was conducted due to national recommendations for restrictions regarding the COVID-19 Pandemic (e.g. social distancing).  This format is felt to be most appropriate for this patient at this time balancing risks to patient and risks to population by having him in for in person visit.  No physical exam was performed (except for noted visual exam or audio findings with Telehealth visits).    Our team/I connected with Idalia Needle Eichler at  4:20 PM EST by a video enabled telemedicine application (doxy.me or caregility through epic) and verified that I am speaking with the correct person using two identifiers.  Location patient: [place] Location provider: [facility], office Persons participating in the virtual visit:  patient  Our team/I discussed the limitations of evaluation and management by telemedicine and the availability of in person appointments. In light of current covid-19 pandemic, patient also understands that we are trying to protect them by minimizing in office contact if at all possible.  The patient expressed consent for telemedicine visit and agreed to proceed. Patient understands insurance will be billed.   ROS- Review of Systems  Constitutional: Negative.   HENT: Positive for hearing loss.        Has hearing aids   Eyes: Negative.   Respiratory: Negative.   Cardiovascular: Positive for palpitations.       Increased anxiety   Gastrointestinal: Negative.   Genitourinary: Negative.   Musculoskeletal: Negative.   Skin: Negative.   Neurological: Positive for headaches.       Thinks it is from mask and stress   Endo/Heme/Allergies: Negative.   Psychiatric/Behavioral: Positive for depression. The patient is nervous/anxious.       Past Medical History-  Patient Active Problem List   Diagnosis Date Noted  . Trigeminal  neuralgia 09/30/2016    Priority: High  . Migraine with aura and without status migrainosus 09/30/2016    Priority: High  . Abnormal MRA, brain 05/15/2016    Priority: High  . Aortic root aneurysm ([place]) 05/03/2016    Priority: High  . History of right breast cancer 08/23/2014    Priority: High  . Tinnitus 01/10/2010    Priority: High  . Vitamin D deficiency 10/11/2017    Priority: Medium  . Hyperlipidemia 11/28/2010    Priority: Medium  . Anxiety 11/28/2010    Priority: Medium  . HEARING LOSS, BILATERAL 01/10/2010    Priority: Medium  . Bee sting-induced anaphylaxis 10/11/2017    Priority: Low  . Sensorineural hearing loss (SNHL) of right ear 05/15/2016    Priority: Low  . Dyspnea 12/14/2014    Priority: Low  . Seasonal and perennial allergic rhinitis 12/14/2014    Priority: Low  . Genetic testing 12/08/2014    Priority: Low  . Loss of transverse plantar arch 07/16/2014    Priority: Low  . Hereditary and idiopathic peripheral neuropathy 01/10/2010    Priority: Low  . Vitamin B12 deficiency 01/09/2010    Priority: Low  . Fatigue 07/14/2008    Priority: Low  . History of skin cancer 05/26/2007    Priority: Low    Medications- reviewed and updated Current Outpatient Medications  Medication Sig Dispense Refill  . b complex vitamins tablet Take 1 tablet by mouth daily.    Azucena Freed Serrata (BOSWELLIA PO) Take 100 mg by mouth 2 (two) times daily.    Marland Kitchen  Cholecalciferol (VITAMIN D) 2000 UNITS CAPS Take by mouth daily.    . Coenzyme Q10 (COQ10 PO) Take 100 mg by mouth 2 (two) times daily.    . cyanocobalamin (,VITAMIN B-12,) 1000 MCG/ML injection 1000 mcg injection once per month. 10 mL 3  . EPINEPHrine 0.3 mg/0.3 mL IJ SOAJ injection INJECT 0.3 MLS INTO THE MUSCLE ONCE FOR 1 DOSE. (FOR BEE STING ALLERGY) 2 Device 0  . HYDROcodone-acetaminophen (NORCO) 5-325 MG tablet Take 1-2 tablets by mouth every 6 (six) hours as needed for moderate pain. 40 tablet 0  . magnesium oxide  (MAG-OX) 400 MG tablet Take 400 mg by mouth daily.    . Probiotic Product (PROBIOTIC DAILY PO) Take by mouth daily.    . tamoxifen (NOLVADEX) 10 MG tablet Take 1 tablet (10 mg total) by mouth daily. 90 tablet 4  . Turmeric 500 MG CAPS Take 500 mg by mouth 2 (two) times daily.    . busPIRone (BUSPAR) 5 MG tablet Take 1 tablet (5 mg total) by mouth 2 (two) times daily as needed. 30 tablet 1  . escitalopram (LEXAPRO) 5 MG tablet Take 1 tablet (5 mg total) by mouth daily. 30 tablet 5   No current facility-administered medications for this visit.     Objective:  Ht 5\' 6"  (1.676 m)   Wt 133 lb (60.3 kg)   BMI 21.47 kg/m  self reported vitals Gen: NAD, resting comfortably Lungs: nonlabored, normal respiratory rate  Skin: appears dry, no obvious rash     Assessment and Plan   #Situational depression-largely related to COVID-19 #Grieving S:Patient has had loss of pet cat recently.  She has also lost two friends due to Covid.  These were 2 of her female friends that died a few weeks apart after being in the [place] were around her age.  She has a lot of frustration around seeing people not being careful about Covid because she cared about her friend so much.Feels some diffuse achiness with the stress.  Also was getting a lot of stress in her face from wearing N95's/equipment to keep her safe at work.Was going for massage therapy/body work but there was little caution and she had to stop doing that which had been helpful. Dr. Jaynee Eagles gave her some short term vicodin. Misses social life and choir practice.  Patient is also trying to care for her bipolar sister and that has been very challenging.  She has had a lot of tearful episodes.  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  She is stressed that she may lose her business ultimately.  She has had increased depression and anxiety. Phq9 today is 16. She is not having any suicidal thought or  ideations. She is having zoom meetings with counselor and pastor. She has been through counseling with hospice in the last few years and feels like she has learned a lot of coping mechanisms.    Has been thinking about medicine for last month. Tried some herbal options for anxiety. St john wort upset her stomach A/P: Situational depression related largely to COVID-19.  Also grieving the loss of her 2 female friends that died from COVID-26 as well as her cat.  Incredibly challenging time.  Also dealing with having to shut her business down and concern for losing her business. -Patient is already done a great job getting involved with hospice grief counseling, counseling with a therapist, counseling with her pastor -Discussed Lexapro or Zoloft.  She was on Lexapro in the past when on chemotherapy in 2014 as well as Effexor-Effexor was incredibly difficult to get off of and she does not want to retrial this. -Ultimately we decided on low-dose Lexapro 5 mg. -Feels she suffers from an anxiety component as well and would like to have something as needed for anxiety-we discussed using low-dose buspirone 5 mg twice daily as needed.  I think this is low risk for serotonin syndrome at this low dose -Discussed potential side effects and recommended for 6-week follow-up  #B12 deficiency-patient completed course of weekly injections and is now on monthly injections.  She needs a refill today-this was provided.  Consider repeat B12 with next blood work  Recommended follow up: 4 to 6-week follow-up recommended Future Appointments  Date Time Provider [place]  05/20/2019 12:40 PM [facility]  05/20/2019  1:30 PM Bartle, Fernande Boyden, MD [facility]  10/14/2019  8:00 AM Marin Olp, MD [facility]    Lab/Order associations:   ICD-10-CM   1. Situational depression  F43.21   2. Grieving  F43.21     Meds ordered this encounter  Medications  . cyanocobalamin (,VITAMIN B-12,)  1000 MCG/ML injection    Sig: 1000 mcg injection once per month.    Dispense:  10 mL    Refill:  3    Please provide syringes to inject (contact our office if order needed with preferred syringes)  . escitalopram (LEXAPRO) 5 MG tablet    Sig: Take 1 tablet (5 mg total) by mouth daily.    Dispense:  30 tablet    Refill:  5  . busPIRone (BUSPAR) 5 MG tablet    Sig: Take 1 tablet (5 mg total) by mouth 2 (two) times daily as needed.    Dispense:  30 tablet    Refill:  1   Time Spent: 31 minutes of total time (4:29-4:50, 5:20-5:30 PM) was spent on the date of the encounter performing the following actions: chart review prior to seeing the patient, obtaining history, performing a medically necessary exam, counseling on the treatment plan, placing orders, and documenting in our EHR.   Return precautions advised.  Garret Reddish, MD

## 2019-05-05 NOTE — Patient Instructions (Addendum)
Health Maintenance Due  Topic Date Due  . PAP SMEAR-Modifier  Had pap in sep will send for notes.  07/03/2019   Taking the medicine as directed and not missing any doses is one of the best things you can do to treat your stituational depression.  Here are some things to keep in mind:  1) Side effects (stomach upset, some increased anxiety) may happen before you notice a benefit.  These side effects typically go away over time. 2) Changes to your dose of medicine or a change in medication all together is sometimes necessary 3) Most people need to be on medication at least 6-12 months 4) Many people will notice an improvement within two weeks but the full effect of the medication can take up to 4-6 weeks 5) Stopping the medication when you start feeling better often results in a return of symptoms 6) If you start having thoughts of hurting yourself or others after starting this medicine, call our office immediately at [phone] or seek care through 911.     Recommended follow up: Return in about 6 weeks (around 06/16/2019).

## 2019-05-06 ENCOUNTER — Encounter: Payer: Self-pay | Admitting: Surgery

## 2019-05-06 ENCOUNTER — Other Ambulatory Visit: Payer: Self-pay | Admitting: Surgery

## 2019-05-06 DIAGNOSIS — I712 Thoracic aortic aneurysm, without rupture, unspecified: Secondary | ICD-10-CM

## 2019-05-18 ENCOUNTER — Encounter: Payer: Self-pay | Admitting: Family Medicine

## 2019-05-20 ENCOUNTER — Other Ambulatory Visit: Payer: 59

## 2019-05-20 ENCOUNTER — Ambulatory Visit: Payer: 59 | Admitting: Surgery

## 2019-06-01 MED FILL — ESCITALOPRAM 5 MG TABLET: 5 | 30 days supply | Qty: 30 | Fill #1

## 2019-06-03 ENCOUNTER — Other Ambulatory Visit: Payer: Self-pay

## 2019-06-03 ENCOUNTER — Ambulatory Visit
Admission: RE | Admit: 2019-06-03 | Discharge: 2019-06-03 | Disposition: A | Discharge status: Home / Self Care | Payer: 59 | Source: Ambulatory Visit | Attending: Surgery | Admitting: Surgery

## 2019-06-03 ENCOUNTER — Telehealth (INDEPENDENT_AMBULATORY_CARE_PROVIDER_SITE_OTHER): Payer: 59 | Admitting: Surgery

## 2019-06-03 DIAGNOSIS — I712 Thoracic aortic aneurysm, without rupture, unspecified: Secondary | ICD-10-CM

## 2019-06-03 MED ORDER — GADOBENATE DIMEGLUMINE 529 MG/ML IV SOLN
13.0000 mL | Freq: Once | INTRAVENOUS | Status: AC | PRN
  Administered 2019-06-03: 09:00:00 13 mL via INTRAVENOUS

## 2019-06-03 NOTE — Progress Notes (Addendum)
[name]       [place] [address]             [phone]     CARDIOTHORACIC SURGERY TELEPHONE VIRTUAL OFFICE NOTE  Referring Provider is Hendricks Limes, MD Primary Cardiologist is No primary care provider on file. PCP is Marin Olp, MD   HPI:  I spoke with [name] (DOB Aug 27, 1960 ) via telephone on 06/03/2019 at 12:05 PM and verified that I was speaking with the correct person using more than one form of identification.  We discussed the reason(s) for conducting our visit virtually instead of in-person.  The patient expressed understanding the circumstances and agreed to proceed as described.   She is a 59 year old woman with a 4.0 cm fusiform ascending aortic aneurysm that was discovered in July 2006 on a breast MRI. She was reportedly referred to [facility] in the past for a medical genetics evaluation to rule out hereditary aneurysm disease and was ruled out for Marfans and Ehlers-Danlos. She reportedly had a second degree relative with Ehlers-Danlos.   I have been following her since August 2016 and the aneurysm has been unchanged in size.  Since I last saw her in January 2019 she has been feeling well overall.  She has had a lot of stress due to the COVID-19 pandemic and the effects on her friends and occupation as a Theme park manager.  She denies any chest or back pain.  She said that she checked her blood pressure this morning and it was 99/68 in one arm and 109/78 in the other arm.   Current Outpatient Medications  Medication Sig Dispense Refill   b complex vitamins tablet Take 1 tablet by mouth daily.     Boswellia Serrata (BOSWELLIA PO) Take 100 mg by mouth 2 (two) times daily.     busPIRone (BUSPAR) 5 MG tablet Take 1 tablet (5 mg total) by mouth 2 (two) times daily as needed. 30 tablet 1   Cholecalciferol (VITAMIN D) 2000 UNITS CAPS Take by mouth daily.     Coenzyme Q10 (COQ10 PO) Take 100 mg by mouth 2 (two) times daily.     cyanocobalamin  (,VITAMIN B-12,) 1000 MCG/ML injection 1000 mcg injection once per month. 10 mL 3   EPINEPHrine 0.3 mg/0.3 mL IJ SOAJ injection INJECT 0.3 MLS INTO THE MUSCLE ONCE FOR 1 DOSE. (FOR BEE STING ALLERGY) 2 Device 0   escitalopram (LEXAPRO) 5 MG tablet Take 1 tablet (5 mg total) by mouth daily. 30 tablet 5   HYDROcodone-acetaminophen (NORCO) 5-325 MG tablet Take 1-2 tablets by mouth every 6 (six) hours as needed for moderate pain. 40 tablet 0   magnesium oxide (MAG-OX) 400 MG tablet Take 400 mg by mouth daily.     Probiotic Product (PROBIOTIC DAILY PO) Take by mouth daily.     tamoxifen (NOLVADEX) 10 MG tablet Take 1 tablet (10 mg total) by mouth daily. 90 tablet 4   Turmeric 500 MG CAPS Take 500 mg by mouth 2 (two) times daily.     No current facility-administered medications for this visit.     Diagnostic Tests:  CLINICAL DATA:  Follow-up of aneurysmal dilatation of the ascending thoracic aorta.   EXAM: MRA CHEST WITH OR WITHOUT CONTRAST   TECHNIQUE: Angiographic images of the chest were obtained using MRA technique with intravenous contrast.   CONTRAST:  41mL MULTIHANCE GADOBENATE DIMEGLUMINE 529 MG/ML IV SOLN   COMPARISON:  05/29/2017   FINDINGS: VASCULAR  Aorta: Stable minimal aneurysmal disease of the ascending thoracic aorta measuring 3.8-4.0 cm in greatest diameter. The aortic root is normal in caliber and measures approximately 2.7-2.8 cm. The aortic arch measures 2.9 cm. The descending thoracic aorta measures 2.0 cm. No evidence of dissection. Proximal great vessels demonstrate normal patency.   Heart: The heart size is normal.  No pericardial fluid identified.   Pulmonary Arteries: Central pulmonary arteries are of normal caliber and normally patent.   NON-VASCULAR   No lymphadenopathy or masses identified. Lungs are unremarkable by MRI. No pleural effusions. Visualized upper abdomen is unremarkable. No visualized bony abnormalities.   IMPRESSION: Stable  aortic dimensions with minimal aneurysmal disease of the ascending thoracic aorta measuring up to 4 cm in greatest diameter.     Electronically Signed   By: Aletta Edouard M.D.   On: 06/03/2019 11:22   Impression:  She has a stable 4 cm fusiform ascending aortic aneurysm which is not changed dating back to 2006.  There has been slight variation from scan to scan with measurements ranging from 38 to 40 mm depending on the study.  Her descending aorta at the level of the diaphragm measures 20 mm.  Since her descending aorta is only 20 mm I would consider the surgical threshold to be around 45 mm instead of the usual 55 mm.  I reviewed the results with her and answered her questions.  Her blood pressure is under good control and I stressed the importance of this in preventing further enlargement of her aorta and acute aortic dissection.  Since his aneurysm has been stable for the last 14 years and her blood pressure is under good control I think it is reasonable to wait 2 years to repeat her MRA.  She is in agreement with that.  Plan:  She will return to see me in 2 years with an MRA of the chest.  I spent 10 minutes on the phone managing her stable ascending aortic aneurysm.  Gaye Pollack, MD 06/03/2019

## 2019-06-30 ENCOUNTER — Telehealth: Payer: Self-pay | Admitting: Allergy & Immunology

## 2019-06-30 NOTE — Telephone Encounter (Signed)
Patient called and and needs to be tested for the covid shot. She has allergy to some of the component in it. She is not a patient here [phone]

## 2019-07-01 NOTE — Telephone Encounter (Signed)
Looks like she is scheduled on 3/11 at [facility]. Why is this a televisit? If she is a new patient to our practice, she needs to be seen in person for initial evaluation and based on that evaluation we can determine whether she will need testing or not.

## 2019-07-01 NOTE — Telephone Encounter (Signed)
It looks like she is scheduled with Dr. Maudie Mercury for a televisit.  I will route to her.  Salvatore Marvel, MD [facility]

## 2019-07-02 ENCOUNTER — Ambulatory Visit: Payer: 59 | Admitting: Allergy & Immunology

## 2019-07-02 ENCOUNTER — Ambulatory Visit: Payer: 59 | Admitting: Allergy

## 2019-07-03 MED FILL — ESCITALOPRAM 5 MG TABLET: 5 | 30 days supply | Qty: 30 | Fill #2

## 2019-07-09 ENCOUNTER — Ambulatory Visit (INDEPENDENT_AMBULATORY_CARE_PROVIDER_SITE_OTHER): Payer: 59 | Admitting: Allergy

## 2019-07-09 ENCOUNTER — Encounter: Payer: Self-pay | Admitting: Allergy

## 2019-07-09 ENCOUNTER — Other Ambulatory Visit: Payer: Self-pay

## 2019-07-09 DIAGNOSIS — J3089 Other allergic rhinitis: Secondary | ICD-10-CM | POA: Diagnosis not present

## 2019-07-09 DIAGNOSIS — T50905D Adverse effect of unspecified drugs, medicaments and biological substances, subsequent encounter: Secondary | ICD-10-CM

## 2019-07-09 DIAGNOSIS — Z872 Personal history of diseases of the skin and subcutaneous tissue: Secondary | ICD-10-CM

## 2019-07-09 DIAGNOSIS — T782XXD Anaphylactic shock, unspecified, subsequent encounter: Secondary | ICD-10-CM

## 2019-07-09 DIAGNOSIS — Z91038 Other insect allergy status: Secondary | ICD-10-CM | POA: Diagnosis not present

## 2019-07-09 DIAGNOSIS — T782XXA Anaphylactic shock, unspecified, initial encounter: Secondary | ICD-10-CM | POA: Insufficient documentation

## 2019-07-09 DIAGNOSIS — T50905A Adverse effect of unspecified drugs, medicaments and biological substances, initial encounter: Secondary | ICD-10-CM | POA: Insufficient documentation

## 2019-07-09 NOTE — Progress Notes (Signed)
New Patient Note  RE: [name] MRN: [id number] DOB: 08/26/1960 Date of Telemedicine Visit: 07/09/2019  Referring provider: Marin Olp, MD Primary care provider: Marin Olp, MD  Chief Complaint: Allergic Reaction (Patient televisit at home. Patient gave verbal consent to treat and bill insurance for this visit.)   Telemedicine New Patient Note via Telephone: I connected with Clorissa Laviola for a new patient visit on 07/09/19 by telephone and verified that I am speaking with the correct person using two identifiers.   I discussed the limitations, risks, security and privacy concerns of performing an evaluation and management service by telephone and the availability of in person appointments. I also discussed with the patient that there may be a patient responsible charge related to this service. The patient expressed understanding and agreed to proceed.  Patient is at home/work. Provider is at the office.  Visit start time: 8:58AM Visit end time: 9:36AM Insurance consent/check in by: front desk Medical consent and medical assistant/nurse: Aileen Pilot.  History of Present Illness: I had the pleasure of talking to [name] for initial evaluation at the [facility] [facility] on 07/09/2019. She is a 59 y.o. female, who is referred here by pharmacist for the evaluation of covid vaccine testing.  Patient has history of reactions to depo medrol injections which contain polysorbate 80 and concerned about receiving the COVID-19 vaccine. She is currently working and would like to receive the vaccine if able to.   Any known reactions to polyethylene glycol or polysorbate?   First reaction - She had reaction to depo medrol injection in 2000 which was given for adhesive capsulitis. She developed wheezing and whole body hives within 15 minutes of the injection. She received a shot of Benadryl and symptoms resolved within 1 hour. This was the first time she had  an depo injection.  Second reaction - Patient saw orthopedics a few years later for her adhesive capsulitis She received 2 depo injections in the arm and within minutes she developed whole body hives, sneezing, throat closure. Patient was taken to the ER and was treated with IV solumedrol, IV Pepcid, benadryl. Patient was observed in the ER for about 8 hours. She does not remember being treated with epinephrine.   Third reaction - Patient went to see an allergist in 2003 and had skin testing done which was positive to dust mites and mold.  Patient was given an IM depo after the allergy testing and within minutes she developed whole body hives, difficulty breathing and shortness of breath. She was treated with epinephrine and symptoms resolved within few hours.   She did have some hives on and off for a few weeks afterwards. Denies having any reactions to the skin testing.   Any history of anaphylaxis to vaccinations, including a COVID19 vaccine?  No other reactions to vaccines and tolerated shingles, flu, tetanus injections with no issues.   Any history of anaphylaxis to colonoscopy preps (i.e.Miralax)?  Patient had 2 colonoscopies. First one was done with mag citrate and the second one was with a newer prep which she is not sure what it was called.   Patient developed pruritus after taking Miralax.   Patient also broke out in hives after Tums as well which contained PEG.   Any history of dermal filler treatments in the last year?  No fillers in the past year.  Assessment and Plan: [name] is a 59 y.o. female with: Drug-induced anaphylaxis 3 separate reactions to depo medrol  injections. The first reaction was mild and symptoms resolved with benadryl. The second reaction required ER visit with IV medications but no epinephrine. The third reaction was treated with epinephrine due to whole body hives and shortness of breath. Patient also had pruritus after taking Miralax in the past. She  had flu, shingles, tetanus vaccines in the past with no issues and some contain polysorbate 80. Patient would like to get the COVID-19 vaccine if eligible.   Discussed with patient at length that given her clinical history, there is no need to undergo the skin testing that we offer as her history confirms an Ige mediated reactions to depo medrol which contain polysorbate 80.   Given her history, I recommend that she undergoes a graded challenge/desensitization to either the [employer] vaccine in an allergist's office. Both of these vaccines contain PEG 2000 and not polysorbate 80.   Our office is in the process of obtaining authorization to be eligible to give the Moderna vaccine.  I advise against the J&J or Astrazeneca vaccine which both contain polysorbate 80.  Meanwhile patient can call the nearby academic centers to see if their allergy department is offering COVID-19 vaccine graded challenges/desensitizations.   Continue to avoid polysorbate 80 products.   Continue to avoid latex, depo medrol injections, doxycycline, Miralax.   Allergy to hymenoptera venom Anaphylactic reaction as a child. No previous work up.  Continue to avoid.  For mild symptoms you can take over the counter antihistamines such as Benadryl and monitor symptoms closely. If symptoms worsen or if you have severe symptoms including breathing issues, throat closure, significant swelling, whole body hives, severe diarrhea and vomiting, lightheadedness then inject epinephrine and seek immediate medical care afterwards. Get bloodwork - tryptase and hymenoptera panel.   History of urticaria History of breaking out in hives without any known triggers in the past. No recent outbreak.  Monitor episodes. If reoccurring again then will do some additional work up at that time.   Other allergic rhinitis Skin testing in the past showed some positives per patient report.  May use over the counter antihistamines such as  Zyrtec (cetirizine), Claritin (loratadine), Allegra (fexofenadine), or Xyzal (levocetirizine) daily as needed.  Return for Drug challenge.   Lab Orders     Chronic Urticaria     Tryptase     Allergen Hymenoptera Panel  Other allergy screening: Asthma: no Rhino conjunctivitis: yes Food allergy: no Medication allergy: yes  Latex - hives Doxycycline - rash  lyrica - dizzy Hymenoptera allergy: yes  Anaphylaxis since a child. Has Epinephrine pen.  Urticaria: yes in the past but not recently. No triggers noted.  Eczema:no History of recurrent infections suggestive of immunodeficency: no  Diagnostics: None.  Past Medical History: Patient Active Problem List   Diagnosis Date Noted  . Drug-induced anaphylaxis 07/09/2019  . Allergy to hymenoptera venom 07/09/2019  . History of urticaria 07/09/2019  . Bee sting-induced anaphylaxis 10/11/2017  . Vitamin D deficiency 10/11/2017  . Trigeminal neuralgia 09/30/2016  . Migraine with aura and without status migrainosus 09/30/2016  . Abnormal MRA, brain 05/15/2016  . Sensorineural hearing loss (SNHL) of right ear 05/15/2016  . Aortic root aneurysm ([place]) 05/03/2016  . Dyspnea 12/14/2014  . Other allergic rhinitis 12/14/2014  . Genetic testing 12/08/2014  . History of right breast cancer 08/23/2014  . Loss of transverse plantar arch 07/16/2014  . Hyperlipidemia 11/28/2010  . Anxiety 11/28/2010  . Hereditary and idiopathic peripheral neuropathy 01/10/2010  . Tinnitus 01/10/2010  . HEARING  LOSS, BILATERAL 01/10/2010  . Vitamin B12 deficiency 01/09/2010  . Fatigue 07/14/2008  . History of skin cancer 05/26/2007   Past Medical History:  Diagnosis Date  . Adhesive capsulitis of shoulder   . Aortic aneurysm ([place])   . Cancer [facility]) 2008   Breast  . Congenital anomaly of aortic arch   . Labyrinthitis, unspecified   . Malignant neoplasm of breast (female), unspecified site   . Other B-complex deficiencies   . Other malaise and  fatigue   . Personal history of chemotherapy   . Personal history of radiation therapy 2008  . Unspecified hearing loss   . Unspecified hereditary and idiopathic peripheral neuropathy   . Unspecified tinnitus    Past Surgical History: Past Surgical History:  Procedure Laterality Date  . ABDOMINAL HYSTERECTOMY  04/2010   robotic  . BASAL CELL CARCINOMA EXCISION  2003   rt eye area  . BREAST FIBROADENOMA SURGERY  1981   Left  . BREAST LUMPECTOMY Right Oct.29 & Mar 19, 2007   Right  . IR GENERIC HISTORICAL  04/16/2016   IR RADIOLOGIST EVAL & MGMT 04/16/2016 [facility]-INTERV RAD  . LAPAROSCOPY  1982   for endometriosis  . wisdom teeth extracted     Medication List:  Current Outpatient Medications  Medication Sig Dispense Refill  . b complex vitamins tablet Take 1 tablet by mouth daily.    . Cholecalciferol (VITAMIN D) 2000 UNITS CAPS Take by mouth daily.    . Coenzyme Q10 (COQ10 PO) Take 100 mg by mouth 2 (two) times daily.    . cyanocobalamin (,VITAMIN B-12,) 1000 MCG/ML injection 1000 mcg injection once per month. 10 mL 3  . EPINEPHrine 0.3 mg/0.3 mL IJ SOAJ injection INJECT 0.3 MLS INTO THE MUSCLE ONCE FOR 1 DOSE. (FOR BEE STING ALLERGY) 2 Device 0  . escitalopram (LEXAPRO) 5 MG tablet Take 1 tablet (5 mg total) by mouth daily. 30 tablet 5  . HYDROcodone-acetaminophen (NORCO) 5-325 MG tablet Take 1-2 tablets by mouth every 6 (six) hours as needed for moderate pain. 40 tablet 0  . magnesium oxide (MAG-OX) 400 MG tablet Take 400 mg by mouth daily.    . Probiotic Product (PROBIOTIC DAILY PO) Take by mouth daily.     No current facility-administered medications for this visit.   Allergies: Allergies  Allergen Reactions  . Bee Venom Anaphylaxis  . Other Anaphylaxis  . Polysorbate Anaphylaxis    "Polysorbate 80 preservative"  . Latex Hives  . Lyrica [Pregabalin] Nausea And Vomiting and Other (See Comments)    Dizziness   . Methylprednisolone Other (See Comments)    Injection only  - has polysorbate 80 preservative  . Miralax [Polyethylene Glycol]     pruritus  . Doxycycline Hyclate Rash   Social History: Social History   Socioeconomic History  . Marital status: Married    Spouse name: Not on file  . Number of children: 0  . Years of education: 49  . Highest education level: Not on file  Occupational History  . Occupation: HAIR STYLIST    Employer: [employer]  . Occupation: Scientist, clinical (histocompatibility and immunogenetics)  . Occupation: model  Tobacco Use  . Smoking status: Never Smoker  . Smokeless tobacco: Never Used  Substance and Sexual Activity  . Alcohol use: Yes    Comment: wine with dinner a few nights a week  . Drug use: No  . Sexual activity: Not on file  Other Topics Concern  . 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 Married '85, no children.    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 Strong interest in herbology and alternative healing.    Right-handed   Social Determinants of Health   Financial Resource Strain:   . Difficulty of Paying Living Expenses:   Food Insecurity:   . Worried About Charity fundraiser in the Last Year:   . Arboriculturist in the Last Year:   Transportation Needs:   . Film/video editor (Medical):   Marland Kitchen Lack of Transportation (Non-Medical):   Physical Activity:   . Days of Exercise per Week:   . Minutes of Exercise per Session:   Stress:   . Feeling of Stress :   Social Connections:   . Frequency of Communication with Friends and Family:   . Frequency of Social Gatherings with Friends and Family:   . Attends Religious Services:   . Active Member of Clubs or Organizations:   . Attends Archivist Meetings:   Marland Kitchen Marital Status:    Lives in a house. Smoking: denies Occupation: Pension scheme manager HistoryJournalist, newspaper in the house: no Pet: yes  Family History: Family History  Problem Relation Age of Onset  . Seizures Mother   . Migraines Mother   . Allergic rhinitis Mother   . Hypertension Father   . Other Father   . Hyperlipidemia Father   . Breast cancer Sister   . Cancer Sister        breast  . Cancer Other        breast  . Breast cancer Maternal Grandmother    Problem                               Relation Anaphylactic reactions No  Review of Systems  Constitutional: Negative for appetite change, chills, fever and unexpected weight change.  HENT: Negative for congestion and rhinorrhea.   Eyes: Negative for itching.  Respiratory: Negative for cough, chest tightness, shortness of breath and wheezing.   Cardiovascular: Negative for chest pain.  Gastrointestinal: Negative for abdominal pain.  Genitourinary: Negative for difficulty urinating.  Skin: Negative for rash.  Allergic/Immunologic: Positive for environmental allergies.  Neurological: Negative for headaches.   Objective: Physical Exam Not obtained as encounter was done via telephone.   Previous notes and tests were reviewed.  I discussed the assessment and treatment plan with the patient. The patient was provided an opportunity to ask questions and all were answered. The patient agreed with the plan and demonstrated an understanding of the instructions. After visit summary/patient instructions available via mychart.   The patient was advised to call back or seek an in-person evaluation if the symptoms worsen or if the condition fails to improve as anticipated.  I provided 38 minutes of non-face-to-face time during this encounter.  It was my pleasure to participate in [place] care today. Please feel free to contact me with any questions or concerns.   Sincerely,  Rexene Alberts, DO Allergy & Immunology  [facility] office: [phone] [facility] office:  [place] office: [phone]

## 2019-07-09 NOTE — Patient Instructions (Addendum)
Drug allergies:  Given your history, I would recommend that we do a graded challenge/desensitization to either the [place] or Pfizer vaccine in an allergist's office. Both of these vaccines contain PEG 2000 and not polysorbate 80.   I will put you on a list and once we get the Moderna vaccine in our office, this vaccine challenge can be scheduled.  I advise against the J&J or Astrazeneca vaccine which both contain polysorbate 80.  Meanwhile you can ask the nearby academic centers to see if their allergy department is offering COVID-19 vaccine graded challenges and/or COVID-19 vaccine desensitizations.   Continue to avoid polysorbate 80  Which interestingly is in certain flu vaccines, tetanus vaccines and the Shingles vaccine which you received in the past with no issues.   Continue to avoid latex, depo medrol injections, doxycycline, miralax.   Stinging insect allergy:  Continue to avoid.  For mild symptoms you can take over the counter antihistamines such as Benadryl and monitor symptoms closely. If symptoms worsen or if you have severe symptoms including breathing issues, throat closure, significant swelling, whole body hives, severe diarrhea and vomiting, lightheadedness then inject epinephrine and seek immediate medical care afterwards. Get bloodwork:  We are ordering labs, so please allow 1-2 weeks for the results to come back. With the newly implemented Cures Act, the labs might be visible to you at the same time that they become visible to me. However, I will not address the results until all of the results are back, so please be patient.   Environmental allergies:  May use over the counter antihistamines such as Zyrtec (cetirizine), Claritin (loratadine), Allegra (fexofenadine), or Xyzal (levocetirizine) daily as needed.  History of hives:  Monitor episodes.  Follow up for the Moderna vaccine graded challenge/desensitizaiton when it becomes available in our office.

## 2019-07-09 NOTE — Assessment & Plan Note (Signed)
Skin testing in the past showed some positives per patient report.  May use over the counter antihistamines such as Zyrtec (cetirizine), Claritin (loratadine), Allegra (fexofenadine), or Xyzal (levocetirizine) daily as needed.

## 2019-07-09 NOTE — Assessment & Plan Note (Addendum)
3 separate reactions to depo medrol injections. The first reaction was mild and symptoms resolved with benadryl. The second reaction required ER visit with IV medications but no epinephrine. The third reaction was treated with epinephrine due to whole body hives and shortness of breath. Patient also had pruritus after taking Miralax in the past. She had flu, shingles, tetanus vaccines in the past with no issues and some contain polysorbate 80. Patient would like to get the COVID-19 vaccine if eligible.   Discussed with patient at length that given her clinical history, there is no need to undergo the skin testing that we offer as her history confirms an Ige mediated reactions to depo medrol which contain polysorbate 80.   Given her history, I recommend that she undergoes a graded challenge/desensitization to either the [employer] vaccine in an allergist's office. Both of these vaccines contain PEG 2000 and not polysorbate 80.   Our office is in the process of obtaining authorization to be eligible to give the Moderna vaccine.  I advise against the J&J or Astrazeneca vaccine which both contain polysorbate 80.  Meanwhile patient can call the nearby academic centers to see if their allergy department is offering COVID-19 vaccine graded challenges/desensitizations.   Continue to avoid polysorbate 80 products.   Continue to avoid latex, depo medrol injections, doxycycline, Miralax.

## 2019-07-09 NOTE — Assessment & Plan Note (Signed)
Anaphylactic reaction as a child. No previous work up.  Continue to avoid.  For mild symptoms you can take over the counter antihistamines such as Benadryl and monitor symptoms closely. If symptoms worsen or if you have severe symptoms including breathing issues, throat closure, significant swelling, whole body hives, severe diarrhea and vomiting, lightheadedness then inject epinephrine and seek immediate medical care afterwards. Get bloodwork - tryptase and hymenoptera panel.

## 2019-07-09 NOTE — Assessment & Plan Note (Signed)
History of breaking out in hives without any known triggers in the past. No recent outbreak.  Monitor episodes. If reoccurring again then will do some additional work up at that time.

## 2019-07-20 DIAGNOSIS — Z872 Personal history of diseases of the skin and subcutaneous tissue: Secondary | ICD-10-CM | POA: Diagnosis not present

## 2019-07-20 DIAGNOSIS — Z91038 Other insect allergy status: Secondary | ICD-10-CM | POA: Diagnosis not present

## 2019-07-29 LAB — ALLERGEN HYMENOPTERA PANEL
Bumblebee: <0.1 kU/L
Honeybee IgE: <0.1 kU/L
Hornet, White Face, IgE: <0.1 kU/L
Hornet, Yellow, IgE: <0.1 kU/L
Paper Wasp IgE: 0.11 kU/L — AB
Yellow Jacket, IgE: 0.13 kU/L — AB

## 2019-07-29 LAB — CHRONIC URTICARIA: cu index: 26.7 — ABNORMAL HIGH (ref <10)

## 2019-07-29 LAB — TRYPTASE: Tryptase: 6.2 ug/L (ref 2.2–13.2)

## 2019-08-03 MED FILL — ESCITALOPRAM 5 MG TABLET: 5 | 30 days supply | Qty: 30 | Fill #3

## 2019-08-11 DIAGNOSIS — N644 Mastodynia: Secondary | ICD-10-CM | POA: Diagnosis not present

## 2019-08-11 DIAGNOSIS — C4490 Unspecified malignant neoplasm of skin, unspecified: Secondary | ICD-10-CM | POA: Insufficient documentation

## 2019-08-11 DIAGNOSIS — N63 Unspecified lump in unspecified breast: Secondary | ICD-10-CM | POA: Insufficient documentation

## 2019-08-11 DIAGNOSIS — L219 Seborrheic dermatitis, unspecified: Secondary | ICD-10-CM | POA: Insufficient documentation

## 2019-08-14 ENCOUNTER — Other Ambulatory Visit: Payer: Self-pay | Admitting: Obstetrics and Gynecology

## 2019-08-14 DIAGNOSIS — N644 Mastodynia: Secondary | ICD-10-CM

## 2019-08-26 ENCOUNTER — Telehealth: Payer: Self-pay

## 2019-08-26 NOTE — Telephone Encounter (Signed)
Patient called wanting to know if she could go ahead a schedule an appointment to get her Covid vaccine here. She states she was put on a waiting list by Dr. Maudie Mercury and was told that we would have the vaccines in a few months. I spoke with Dr. Maudie Mercury and was told that the patient has been put on waiting list but we do not have the vaccines at this moment. She can try and contact [facility] since she would need to be in allergist office due to her reactions to vaccines. I informed patient of that and she states she has tried several times to get an appointment and has not heard back from anyone. Patient would like for a referral to be sent to them so she would have better luck getting an appointment.

## 2019-08-27 NOTE — Telephone Encounter (Signed)
Please call the following 3 academic center allergy departments and see if they offer in office covid-19 vaccination administration/challenge in the allergist's office for patients with history of anaphylactic reaction to DepoMedrol. Patient has not received any of the vaccines yet.   [facility] [phone]   [facility] [phone]  [facility] [phone]

## 2019-08-27 NOTE — Telephone Encounter (Signed)
Called all three facilities and they do not offer the in office Covid-19 administration for reaction to DepoMedrol.

## 2019-08-27 NOTE — Telephone Encounter (Signed)
Please call patient back and tell her we checked with [facility], [facility] and none of them were giving covid-19 vaccine in the office.  I still have her on our list to contact once we get the vaccine in the office. Unfortunately, I do not have a timeline for this yet.

## 2019-08-28 NOTE — Telephone Encounter (Signed)
Called and informed patient that we called the three offices and none does the in office covid-19. We still have you on the list for our office and as soon as we receive the vaccine we will contact her.

## 2019-09-01 MED FILL — ESCITALOPRAM 5 MG TABLET: 5 | 30 days supply | Qty: 30 | Fill #4

## 2019-09-03 MED FILL — BD 3 ML SYRINGE WITH NEEDLE: 23G X 1-1/2 | 84 days supply | Qty: 6 | Fill #0

## 2019-09-03 MED FILL — CYANOCOBALAMIN 1,000 MCG/ML: 1000 | 84 days supply | Qty: 3 | Fill #1

## 2019-09-04 ENCOUNTER — Ambulatory Visit
Admission: RE | Admit: 2019-09-04 | Discharge: 2019-09-04 | Disposition: A | Discharge status: Home / Self Care | Payer: 59 | Source: Ambulatory Visit | Attending: Obstetrics and Gynecology | Admitting: Obstetrics and Gynecology

## 2019-09-04 ENCOUNTER — Other Ambulatory Visit: Payer: Self-pay

## 2019-09-04 DIAGNOSIS — N644 Mastodynia: Secondary | ICD-10-CM

## 2019-09-04 DIAGNOSIS — R928 Other abnormal and inconclusive findings on diagnostic imaging of breast: Secondary | ICD-10-CM | POA: Diagnosis not present

## 2019-09-22 NOTE — Telephone Encounter (Signed)
[name] where are we at with this?

## 2019-09-22 NOTE — Telephone Encounter (Signed)
Patient called back asking about the COVID vaccine. She is checking to see when we will have it in the office and about the list Dr. Maudie Mercury has put her on.

## 2019-10-03 MED FILL — ESCITALOPRAM 5 MG TABLET: 5 | 30 days supply | Qty: 30 | Fill #5

## 2019-10-13 NOTE — Progress Notes (Signed)
Phone [phone]   Subjective:  Patient presents today for their annual physical. Chief complaint-noted.   See problem oriented charting- ROS- full  review of systems was completed and negative except for: some sensitivity with lipoma, migraines better recently, still some stress, Stable tinnitus and hearing loss  The following were reviewed and entered/updated in epic: Past Medical History:  Diagnosis Date  . Adhesive capsulitis of shoulder   . Aortic aneurysm ([place])   . Cancer [facility]) 2008   Breast  . Congenital anomaly of aortic arch   . Labyrinthitis, unspecified   . Malignant neoplasm of breast (female), unspecified site   . Other B-complex deficiencies   . Other malaise and fatigue   . Personal history of chemotherapy   . Personal history of radiation therapy 2008  . Unspecified hearing loss   . Unspecified hereditary and idiopathic peripheral neuropathy   . Unspecified tinnitus    Patient Active Problem List   Diagnosis Date Noted  . Trigeminal neuralgia 09/30/2016    Priority: High  . Migraine with aura and without status migrainosus 09/30/2016    Priority: High  . Abnormal MRA, brain 05/15/2016    Priority: High  . Aortic root aneurysm ([employer]) 05/03/2016    Priority: High  . History of right breast cancer 08/23/2014    Priority: High  . Tinnitus 01/10/2010    Priority: High  . Vitamin D deficiency 10/11/2017    Priority: Medium  . Hyperlipidemia 11/28/2010    Priority: Medium  . Anxiety 11/28/2010    Priority: Medium  . HEARING LOSS, BILATERAL 01/10/2010    Priority: Medium  . Bee sting-induced anaphylaxis 10/11/2017    Priority: Low  . Sensorineural hearing loss (SNHL) of right ear 05/15/2016    Priority: Low  . Dyspnea 12/14/2014    Priority: Low  . Other allergic rhinitis 12/14/2014    Priority: Low  . Genetic testing 12/08/2014    Priority: Low  . Loss of transverse plantar arch 07/16/2014    Priority: Low  . Hereditary and idiopathic  peripheral neuropathy 01/10/2010    Priority: Low  . Vitamin B12 deficiency 01/09/2010    Priority: Low  . Fatigue 07/14/2008    Priority: Low  . History of skin cancer 05/26/2007    Priority: Low  . Drug-induced anaphylaxis 07/09/2019  . Allergy to hymenoptera venom 07/09/2019  . History of urticaria 07/09/2019   Past Surgical History:  Procedure Laterality Date  . ABDOMINAL HYSTERECTOMY  04/2010   robotic  . BASAL CELL CARCINOMA EXCISION  2003   rt eye area  . BREAST FIBROADENOMA SURGERY  1981   Left  . BREAST LUMPECTOMY Right Oct.29 & Mar 19, 2007   Right  . IR GENERIC HISTORICAL  04/16/2016   IR RADIOLOGIST EVAL & MGMT 04/16/2016 [facility]INTERV RAD  . LAPAROSCOPY  1982   for endometriosis  . wisdom teeth extracted      Family History  Problem Relation Age of Onset  . Seizures Mother   . Migraines Mother   . Allergic rhinitis Mother   . Hypertension Father   . Other Father   . Hyperlipidemia Father   . Breast cancer Sister   . Cancer Sister        breast  . Cancer Other        breast  . Breast cancer Maternal Grandmother     Medications- reviewed and updated Current Outpatient Medications  Medication Sig Dispense Refill  . b complex vitamins tablet Take 1 tablet by  mouth daily.    . Cholecalciferol (VITAMIN D) 2000 UNITS CAPS Take by mouth daily.    . Coenzyme Q10 (COQ10 PO) Take 100 mg by mouth 2 (two) times daily.    . cyanocobalamin (,VITAMIN B-12,) 1000 MCG/ML injection 1000 mcg injection once per month. 10 mL 3  . EPINEPHrine 0.3 mg/0.3 mL IJ SOAJ injection INJECT 0.3 MLS INTO THE MUSCLE ONCE FOR 1 DOSE. (FOR BEE STING ALLERGY) 2 Device 0  . escitalopram (LEXAPRO) 5 MG tablet Take 1 tablet (5 mg total) by mouth daily. 30 tablet 5  . HYDROcodone-acetaminophen (NORCO) 5-325 MG tablet Take 1-2 tablets by mouth every 6 (six) hours as needed for moderate pain. 40 tablet 0  . magnesium oxide (MAG-OX) 400 MG tablet Take 400 mg by mouth daily.    . Probiotic Product  (PROBIOTIC DAILY PO) Take by mouth daily.     No current facility-administered medications for this visit.    Allergies-reviewed and updated Allergies  Allergen Reactions  . Bee Venom Anaphylaxis  . Other Anaphylaxis  . Polysorbate Anaphylaxis    "Polysorbate 80 preservative"  . Latex Hives  . Lyrica [Pregabalin] Nausea And Vomiting and Other (See Comments)    Dizziness   . Methylprednisolone Other (See Comments)    Injection only - has polysorbate 80 preservative  . Miralax [Polyethylene Glycol]     pruritus  . 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 Married '85, no children.    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 Strong interest in herbology and alternative healing.    Right-handed   Objective  Objective:  BP 130/78   Pulse 69   Temp 97.7 F (36.5 C)   Ht 5\' 6"  (1.676 m)   Wt 141 lb (64 kg)   SpO2 99%   BMI 22.76 kg/m  Gen: NAD, resting comfortably HEENT: Mucous membranes are moist. Oropharynx normal. Cerumen impaction on the right Neck: no thyromegaly CV: RRR no murmurs rubs or gallops Lungs: CTAB no crackles, wheeze, rhonchi Abdomen: soft/nontender/nondistended/normal bowel sounds. No rebound or guarding.  Ext: no edema Skin: warm, dry Neuro: grossly normal, moves all extremities, PERRLA   Assessment and Plan   59 y.o. female presenting for annual physical.  Health Maintenance counseling: 1. Anticipatory guidance: Patient counseled regarding regular dental exams -q6 months advised-she is being cautious due to COVID-19 and wants to get vaccination first, eye exams-yearly typically ,  avoiding smoking and second hand smoke , limiting alcohol to 1 beverage per day .   2. Risk factor reduction:  Advised patient of need for regular  exercise and diet rich and fruits and vegetables to reduce risk of heart attack and stroke. Exercise- 5 days a week. Diet-cooking  At home high fiber diet. Healthy weight noted Wt Readings from Last 3 Encounters:  10/14/19 141 lb (64 kg)  05/05/19 133 lb (60.3 kg)  03/06/19 137 lb 12.8 oz (62.5 kg)  3. Immunizations/screenings/ancillary studies-see discussion below about COVID-19 vaccine otherwise up-to-date Immunization History  Administered Date(s) Administered  . Influenza Inj Mdck Quad Pf 02/05/2018  . Influenza Whole 03/04/2008, 02/26/2010  . Influenza,inj,Quad PF,6+ Mos 01/28/2014, 01/10/2017, 02/05/2019  . Influenza,trivalent, recombinat, inj, PF 02/24/2015  . Influenza-Unspecified 03/03/2013, 01/25/2016, 02/05/2018  . Td 03/04/2008  . Tdap 10/13/2018  .  Zoster 12/26/2018  . Zoster Recombinat (Shingrix) 05/14/2018, 07/15/2018, 12/29/2018   4. Cervical cancer screening- March 2018 with 3-year repeat planned last noted-today she reportsshe continues to see [facility]- has every 2 years typically.  She does have history of total hysterectomy for benign reasons-technically not required to have Pap smears but we will obviously defer to gynecology 5. Breast cancer screening- History of right breast cancer and has finished time on tamoxifen.  breast exam with gynecology and mammogram December 26, 2018 with 1 year repeat planned with 3d 6. Colon cancer screening - November 25, 2014 with 10-year repeat planned 7. Skin cancer screening-follows with Dr. Ledell Peoples office- has not been with covid 19 pandemic. advised regular sunscreen use. Denies worrisome, changing, or new skin lesions.  8. Birth control/STD check-hysterectomy and monogamous  9. Osteoporosis screening at 2-  had done with oncology in 2018 -Never smoker  Status of chronic or acute concerns   # B12 deficiency S: Current treatment/medication (oral vs. IM): B complex vitamin, Vitamin b-12 1000MCG Lab Results  Component Value  Date   VITAMINB12 183 (L) 10/13/2018  A/P: Hopefully controlled update B12 today  #Vitamin D deficiency S: Medication:  2000 units of vitamin D per day. Did 5000 for a 3 months Last vitamin D Lab Results  Component Value Date   VD25OH 31.63 10/13/2018  A/P: hopefully controlled- update vitamin D today   # Anxiety/situational depression due to COVID-19 pandemic S:Medication: Lexapro 5Mg  Counseling: Has been working with a therapist and hospice grief counseling after loss of 2) from Covid- doing well without this now. Doing meditation and some supplements. Also has a good conversation with a good friend once a week Depression screen [facility] 2/9 10/14/2019 05/05/2019 03/06/2019  Decreased Interest 0 3 0  Down, Depressed, Hopeless 0 3 0  PHQ - 2 Score 0 6 0  Altered sleeping 0 1 1  Tired, decreased energy 0 3 1  Change in appetite 0 3 0  Feeling bad or failure about yourself  0 0 0  Trouble concentrating 0 3 0  Moving slowly or fidgety/restless 0 0 0  Suicidal thoughts 0 0 0  PHQ-9 Score 0 16 2  Difficult doing work/chores Not difficult at all Not difficult at all Not difficult at all  Some recent data might be hidden  A/P: doing well- will continue current medicines- full remission.    Right Sinus Pain S: pt c/o right sinus pressure and teeth pain. 2 weeks of issues. Some runny nose. She is very cautious at work- wears double mask- n95 included and wonders if moisture is contributing. No discharge. Has some upper sinus tenderness. Has tried some saline spray without much relief. Has tried grapefuit seed extract.   A/P: she is going to try nasal saline rinses like a neilmed sinus rinse twice a day for a week to 10 days and if not improving consider course of augmentin - she will reach out by mychart. Sooner if symptoms worsen  Vaccine Concerns S: wants the vaccine but is very allergic, plans to get it at an allergist but she is feeling stressed because she is not vaccinated and the world is  starting to go maskless. Working with Byrd Hesselbach- trying to get her set up with [facility]- and apparently [place] may be able to do this soon  Stress with work and people not wanting to wear a mask.  A/P: tough situation but has good resources- hopefully this can get resolved soon  for her.  -could consider this in our office with close supervision with allergist.    Lipoma S: pt has concerns of  Lipoma on her right mid back. Gets some burning sensation. Also got some itching near prior surgical site on breast. Wonders if these could be related. Lipoma slightly smaller though  A/P: she has considered sugery but was cancelled due to covid 19- planning on holding off until fully vaccinated.    # GERD S:some right upper quadrant sensitivity. Better with pepcid or holding the area.  gets slight reflux A/P: Discussed trial of Pepcid twice a day for 1 week.  If this is not effective can try for 2 weeks.  If this not effective can drop them off.  They will contact you could try Prilosec 20 mg over-the-counter for up to 2 months and if that is not effective we will consider GI referral for endoscopy discussion  #hyperlipidemia S: Medication:none  Lab Results  Component Value Date   CHOL 173 10/13/2018   HDL 49.70 10/13/2018   LDLCALC 103 (H) 10/13/2018   LDLDIRECT 110.1 04/26/2011   TRIG 99.0 10/13/2018   CHOLHDL 3 10/13/2018   A/P: Mildly elevated cholesterol but 10-year risk of heart attack or stroke is still low at 3.2%-recommended continued efforts for healthy eating/regular exercise which she does phenomenal job on   #Trigeminal neuralgia and migraines-follows with Dr. Jaynee Eagles. Also has a vascular loop and has been some consideration of gamma knife procedure- has done significantly better and wants to continue to hold off. Last year she was doing better with acupuncture. Sparing Fioricet or hydrocodone through Dr. Jaynee Eagles.   #Aortic root aneurysm-follow with Dr. Cyndia Bent. 2-year  repeat planned due to stability Hyperlipidemia, unspecified hyperlipidemia type - Plan: CBC with Differential/Platelet, Comprehensive metabolic panel, Lipid panel - home blood pressures all under 120/ 70.   #some cramping in her feet with swimming- will check magnesium  Recommended follow up:  1 year physical or sooner if needed  Lab/Order associations: fasting   ICD-10-CM   1. Vitamin B12 deficiency  E53.8 Vitamin B12  2. Hyperlipidemia, unspecified hyperlipidemia type  E78.5 CBC with Differential/Platelet    Comprehensive metabolic panel    Lipid panel  3. Anxiety  F41.9   4. Vitamin D deficiency  E55.9 VITAMIN D 25 Hydroxy (Vit-D Deficiency, Fractures)  5. Preventative health care  Z00.00 CBC with Differential/Platelet    Comprehensive metabolic panel    Lipid panel    Vitamin B12    VITAMIN D 25 Hydroxy (Vit-D Deficiency, Fractures)  6. Trigeminal neuralgia  G50.0   7. Tinnitus of right ear  H93.11   8. Migraine with aura and without status migrainosus, not intractable  G43.109   9. History of right breast cancer  Z85.3   10. Aortic root aneurysm ([facility])  I71.9     No orders of the defined types were placed in this encounter.   Return precautions advised.  Garret Reddish, MD

## 2019-10-14 ENCOUNTER — Other Ambulatory Visit: Payer: Self-pay

## 2019-10-14 ENCOUNTER — Ambulatory Visit (INDEPENDENT_AMBULATORY_CARE_PROVIDER_SITE_OTHER): Payer: 59 | Admitting: Family Medicine

## 2019-10-14 ENCOUNTER — Other Ambulatory Visit: Payer: Self-pay | Admitting: Family Medicine

## 2019-10-14 ENCOUNTER — Encounter: Payer: Self-pay | Admitting: Family Medicine

## 2019-10-14 VITALS — BP 130/78 | HR 69 | Temp 97.7°F | Ht 66.0 in | Wt 141.0 lb

## 2019-10-14 DIAGNOSIS — E538 Deficiency of other specified B group vitamins: Secondary | ICD-10-CM

## 2019-10-14 DIAGNOSIS — E559 Vitamin D deficiency, unspecified: Secondary | ICD-10-CM | POA: Diagnosis not present

## 2019-10-14 DIAGNOSIS — F419 Anxiety disorder, unspecified: Secondary | ICD-10-CM | POA: Diagnosis not present

## 2019-10-14 DIAGNOSIS — R252 Cramp and spasm: Secondary | ICD-10-CM | POA: Diagnosis not present

## 2019-10-14 DIAGNOSIS — G43109 Migraine with aura, not intractable, without status migrainosus: Secondary | ICD-10-CM | POA: Diagnosis not present

## 2019-10-14 DIAGNOSIS — I7121 Aneurysm of the ascending aorta, without rupture: Secondary | ICD-10-CM

## 2019-10-14 DIAGNOSIS — G500 Trigeminal neuralgia: Secondary | ICD-10-CM | POA: Diagnosis not present

## 2019-10-14 DIAGNOSIS — E785 Hyperlipidemia, unspecified: Secondary | ICD-10-CM | POA: Diagnosis not present

## 2019-10-14 DIAGNOSIS — Z853 Personal history of malignant neoplasm of breast: Secondary | ICD-10-CM

## 2019-10-14 DIAGNOSIS — I719 Aortic aneurysm of unspecified site, without rupture: Secondary | ICD-10-CM

## 2019-10-14 DIAGNOSIS — Z0000 Encounter for general adult medical examination without abnormal findings: Secondary | ICD-10-CM | POA: Diagnosis not present

## 2019-10-14 DIAGNOSIS — Z90710 Acquired absence of both cervix and uterus: Secondary | ICD-10-CM

## 2019-10-14 DIAGNOSIS — H9311 Tinnitus, right ear: Secondary | ICD-10-CM

## 2019-10-14 LAB — LIPID PANEL
Cholesterol: 226 mg/dL — ABNORMAL HIGH (ref 0–200)
HDL: 53.9 mg/dL (ref >39.00)
LDL Cholesterol: 151 mg/dL — ABNORMAL HIGH (ref 0–99)
NonHDL: 171.84
Total CHOL/HDL Ratio: 4
Triglycerides: 104 mg/dL (ref 0.0–149.0)
VLDL: 20.8 mg/dL (ref 0.0–40.0)

## 2019-10-14 LAB — CBC WITH DIFFERENTIAL/PLATELET
Basophils Absolute: 0 10*3/uL (ref 0.0–0.1)
Basophils Relative: 0.7 % (ref 0.0–3.0)
Eosinophils Absolute: 0.2 10*3/uL (ref 0.0–0.7)
Eosinophils Relative: 4 % (ref 0.0–5.0)
HCT: 43.4 % (ref 36.0–46.0)
Hemoglobin: 14.7 g/dL (ref 12.0–15.0)
Lymphocytes Relative: 30.7 % (ref 12.0–46.0)
Lymphs Abs: 1.6 10*3/uL (ref 0.7–4.0)
MCHC: 33.8 g/dL (ref 30.0–36.0)
MCV: 92.7 fl (ref 78.0–100.0)
Monocytes Absolute: 0.4 10*3/uL (ref 0.1–1.0)
Monocytes Relative: 8.2 % (ref 3.0–12.0)
Neutro Abs: 3 10*3/uL (ref 1.4–7.7)
Neutrophils Relative %: 56.4 % (ref 43.0–77.0)
Platelets: 307 10*3/uL (ref 150.0–400.0)
RBC: 4.68 Mil/uL (ref 3.87–5.11)
RDW: 12.8 % (ref 11.5–15.5)
WBC: 5.3 10*3/uL (ref 4.0–10.5)

## 2019-10-14 LAB — COMPREHENSIVE METABOLIC PANEL
ALT: 18 U/L (ref 0–35)
AST: 22 U/L (ref 0–37)
Albumin: 4.7 g/dL (ref 3.5–5.2)
Alkaline Phosphatase: 62 U/L (ref 39–117)
BUN: 17 mg/dL (ref 6–23)
CO2: 30 mEq/L (ref 19–32)
Calcium: 9.1 mg/dL (ref 8.4–10.5)
Chloride: 102 mEq/L (ref 96–112)
Creatinine, Ser: 0.7 mg/dL (ref 0.40–1.20)
GFR: 85.12 mL/min (ref >60.00)
Glucose, Bld: 106 mg/dL — ABNORMAL HIGH (ref 70–99)
Potassium: 4.8 mEq/L (ref 3.5–5.1)
Sodium: 139 mEq/L (ref 135–145)
Total Bilirubin: 0.4 mg/dL (ref 0.2–1.2)
Total Protein: 6.9 g/dL (ref 6.0–8.3)

## 2019-10-14 LAB — MAGNESIUM: Magnesium: 2.3 mg/dL (ref 1.5–2.5)

## 2019-10-14 LAB — VITAMIN D 25 HYDROXY (VIT D DEFICIENCY, FRACTURES): VITD: 32.65 ng/mL (ref 30.00–100.00)

## 2019-10-14 LAB — VITAMIN B12: Vitamin B-12: 273 pg/mL (ref 211–911)

## 2019-10-14 MED ORDER — ESCITALOPRAM OXALATE 5 MG PO TABS: 5.0000 mg | ORAL_TABLET | Freq: Every day | ORAL | 3 refills | Status: DC

## 2019-10-14 MED ORDER — EPINEPHRINE 0.3 MG/0.3ML IJ SOAJ: INTRAMUSCULAR | 0 refills | Status: DC

## 2019-10-14 NOTE — Patient Instructions (Addendum)
she is going to try nasal saline rinses like a neilmed sinus rinse twice a day for a week to 10 days and if not improving consider course of augmentin - she will reach out by mychart. Sooner if symptoms worsen  Discussed trial of Pepcid twice a day for 1 week.  If this is not effective can try for 2 weeks.  If this not effective can drop them off.  They will contact you could try Prilosec 20 mg over-the-counter for up to 2 months and if that is not effective we will consider GI referral for endoscopy discussion  Mineral oil for ear full of wax Purchase mineral oil from laxative aisle Lay down on your side with ear that is bothering you facing up Use 3-4 drops with a dropper and place in ear for 30 seconds Place cotton swab outside of ear Turn to other side and allow this to drain Repeat 3-4 x a day Return to see [place] if not improving within a few days  Please stop by lab before you go If you have mychart- we will send your results within 3 business days of [place] receiving them.  If you do not have mychart- we will call you about results within 5 business days of [place] receiving them.     Sign release of information at the check out desk for last pap smear

## 2019-10-15 ENCOUNTER — Encounter: Payer: Self-pay | Admitting: Family Medicine

## 2019-10-16 NOTE — Telephone Encounter (Signed)
FYI

## 2019-10-22 ENCOUNTER — Encounter: Payer: Self-pay | Admitting: Family Medicine

## 2019-10-26 MED ORDER — AMOXICILLIN-POT CLAVULANATE 875-125 MG PO TABS: 1.0000 | ORAL_TABLET | Freq: Two times a day (BID) | ORAL | 0 refills | Status: AC

## 2019-10-26 MED FILL — AMOX-CLAV 875-125 MG TABLET: 875-125 | 7 days supply | Qty: 14 | Fill #0

## 2019-11-03 MED FILL — ESCITALOPRAM 5 MG TABLET: 5 | 90 days supply | Qty: 90 | Fill #0

## 2019-11-11 ENCOUNTER — Encounter: Payer: Self-pay | Admitting: Surgery

## 2019-11-16 ENCOUNTER — Encounter: Payer: Self-pay | Admitting: Allergy

## 2019-11-30 ENCOUNTER — Other Ambulatory Visit: Payer: Self-pay | Admitting: Obstetrics and Gynecology

## 2019-11-30 DIAGNOSIS — Z1231 Encounter for screening mammogram for malignant neoplasm of breast: Secondary | ICD-10-CM

## 2019-12-10 NOTE — Telephone Encounter (Signed)
Called patient to cancel appointment on 8/23 for COVID injection. Need to advise patient that [facility] [place] is not yet approved to get the COVID vaccine yet. [facility] office is working on this and will call patient when able to schedule for the vaccine.

## 2019-12-21 ENCOUNTER — Ambulatory Visit: Payer: Self-pay | Admitting: Family

## 2019-12-25 ENCOUNTER — Ambulatory Visit: Payer: 59

## 2019-12-25 ENCOUNTER — Other Ambulatory Visit: Payer: Self-pay

## 2019-12-25 NOTE — Assessment & Plan Note (Addendum)
Past history - 3 separate reactions to depo medrol injections. The first reaction was mild and symptoms resolved with benadryl. The second reaction required ER visit with IV medications but no epinephrine. The third reaction was treated with epinephrine due to whole body hives and shortness of breath. Patient also had pruritus and hives in her mouth after taking Miralax in the past. She had flu, shingles, tetanus vaccines in the past with no issues and some contain polysorbate 80. Patient would like to get the COVID-19 vaccine if eligible.  Interim history - patient forgot to mention prior to testing that she took famotidine for her reflux. She had negative histamine control.  Discussed with patient that unable to complete the whole testing today as she is not reacting the positive histamine control.  Patient is agreeable to be rescheduled for Monday in our [facility] office.  Advised her to avoid all antihistamines from now until the next visit including the famotidine.  Upon further research it seems that certain formulations of Depo-medrol contain small amounts of polysorbate-80 and some do not contain any.  Given her history of reactions to both depo-medrol and Miralax it may be possible that patient was reacting to the PEG 3350 that are present in both of these medications rather than the polysorbate-80.   We will perform skin prick and intradermal testing to products containing PEG 3350 and polysorbate-80 next week to clarify this.

## 2019-12-25 NOTE — Progress Notes (Signed)
Follow Up Note  RE: MERRANDA BOLLS MRN: [id number] DOB: [birth year] Date of Office Visit: 12/25/2019  Referring provider: Marin Olp, MD Primary care provider: Marin Olp, MD  Chief Complaint: COVID-19 vaccine  Assessment and Plan: Aniah is a 59 y.o. female with: Drug-induced anaphylaxis Past history - 3 separate reactions to depo medrol injections. The first reaction was mild and symptoms resolved with benadryl. The second reaction required ER visit with IV medications but no epinephrine. The third reaction was treated with epinephrine due to whole body hives and shortness of breath. Patient also had pruritus and hives in her mouth after taking Miralax in the past. She had flu, shingles, tetanus vaccines in the past with no issues and some contain polysorbate 80. Patient would like to get the COVID-19 vaccine if eligible.  Interim history - patient forgot to mention prior to testing that she took famotidine for her reflux. She had negative histamine control.  Discussed with patient that unable to complete the whole testing today as she is not reacting the positive histamine control.  Patient is agreeable to be rescheduled for Monday in our [facility] office.  Advised her to avoid all antihistamines from now until the next visit including the famotidine.  Upon further research it seems that certain formulations of Depo-medrol contain small amounts of polysorbate-80 and some do not contain any.  Given her history of reactions to both depo-medrol and Miralax it may be possible that patient was reacting to the PEG 3350 that are present in both of these medications rather than the polysorbate-80.   We will perform skin prick and intradermal testing to products containing PEG 3350 and polysorbate-80 next week to clarify this.   History of Present Illness: I had the pleasure of seeing Shaynna Husby for a follow up visit at the [facility] [place] on  12/25/2019. She is a 59 y.o. female, who is being followed for drug allergies, hymenoptera allergy, history of urticaria allergic rhinitis. Her previous allergy office visit was on 07/09/2019 with Dr. Maudie Mercury via telemedicine. Today she is here for pfizer COVID-19 drug challenge.   History of Reaction:  Patient states she has history of reactions to depo-medrol injections and was told that it has polysorbate-80 and to avoid polysorbate-80 in the future.   She is concerned about receiving the COVID-19 vaccine. She is currently working and would like to receive the vaccine if able to.  Last Miralax reaction was 10 years - broke out in hives in her mouth after she had Miralax.  From last OV note: "Any known reactions to polyethylene glycol or polysorbate?  First reaction -She had reaction to depo medrol injection in 2000which was givenfor adhesive capsulitis. She developed wheezing and whole body hives within 15 minutes of the injection. She received a shot of Benadryl and symptoms resolved within 1 hour. This was the first time she had an depo injection.  Second reaction -Patient saw orthopedics a few years later for her adhesive capsulitis She received 2 depo injectionsin the armand within minutes she developed whole body hives, sneezing, throat closure. Patient was taken to the ER and was treated with IV solumedrol, IV Pepcid, benadryl. Patient was observed in the ER for about 8 hours. She does not remember being treated with epinephrine.   Third reaction -Patient went to seean allergist in 2003 and had skin testing done which was positive to dust mites and mold. ? Patient was given an IM depo after the allergy  testing and within minutes she developed whole body hives, difficulty breathing and shortness of breath. She was treated with epinephrine and symptoms resolved within few hours.  ? She did have some hives on and off for a few weeksafterwards. Denies having any reactions to the skin  testing.  Any history of anaphylaxis to vaccinations, including a COVID19 vaccine?  No other reactions to vaccines and tolerated shingles, flu, tetanus injections with no issues.   Any history of anaphylaxis to colonoscopy preps (i.e.Miralax)?  Patient had 2 colonoscopies. First one was done with mag citrate and the second one was with a newer prep which she is not sure what it was called.   Patient developed pruritus after taking Miralax.   Patient also broke out in hives after Tums as well which contained PEG. "  Interval History: Patient has not been ill, she has not had any accidental exposures to the culprit medication.   Recent/Current History: Pulmonary disease: no Cardiac disease: no Respiratory infection: no Rash: no Itch: no Swelling: no Cough: no Shortness of breath: no Runny/stuffy nose: no Itchy eyes: no Beta-blocker use: no  Patient/guardian was informed of the test procedure with verbalized understanding of the risk of anaphylaxis. Consent was signed.   Last antihistamine use: none - but then patient states she has been using famotidine for her heartburn Last beta-blocker use: none  Medication List:  Current Outpatient Medications  Medication Sig Dispense Refill  . b complex vitamins tablet Take 1 tablet by mouth daily.    . Cholecalciferol (VITAMIN D) 2000 UNITS CAPS Take by mouth daily.    . Coenzyme Q10 (COQ10 PO) Take 100 mg by mouth 2 (two) times daily.    . cyanocobalamin (,VITAMIN B-12,) 1000 MCG/ML injection 1000 mcg injection once per month. 10 mL 3  . EPINEPHrine 0.3 mg/0.3 mL IJ SOAJ injection INJECT 0.3 MLS INTO THE MUSCLE ONCE FOR 1 DOSE. (FOR BEE STING ALLERGY) 2 each 0  . escitalopram (LEXAPRO) 5 MG tablet Take 1 tablet (5 mg total) by mouth daily. 90 tablet 3  . HYDROcodone-acetaminophen (NORCO) 5-325 MG tablet Take 1-2 tablets by mouth every 6 (six) hours as needed for moderate pain. 40 tablet 0  . magnesium oxide (MAG-OX) 400 MG tablet  Take 400 mg by mouth daily.    . Probiotic Product (PROBIOTIC DAILY PO) Take by mouth daily.     No current facility-administered medications for this visit.   Allergies: Allergies  Allergen Reactions  . Bee Venom Anaphylaxis  . Other Anaphylaxis  . Polysorbate Anaphylaxis    "Polysorbate 80 preservative"  . Latex Hives  . Lyrica [Pregabalin] Nausea And Vomiting and Other (See Comments)    Dizziness   . Methylprednisolone Other (See Comments)    Injection only - has polysorbate 80 preservative  . Miralax [Polyethylene Glycol]     pruritus  . Doxycycline Hyclate Rash   I reviewed her past medical history, social history, family history, and environmental history and no significant changes have been reported from her previous visit.  Review of Systems  Constitutional: Negative for appetite change, chills, fever and unexpected weight change.  HENT: Negative for congestion and rhinorrhea.   Eyes: Negative for itching.  Respiratory: Negative for cough, chest tightness, shortness of breath and wheezing.   Cardiovascular: Negative for chest pain.  Gastrointestinal: Negative for abdominal pain.  Genitourinary: Negative for difficulty urinating.  Skin: Negative for rash.  Allergic/Immunologic: Positive for environmental allergies.  Neurological: Negative for headaches.   Objective: There were  no vitals taken for this visit. There is no height or weight on file to calculate BMI. Physical Exam Vitals and nursing note reviewed. Exam conducted with a chaperone present.  Constitutional:      Appearance: Normal appearance. She is well-developed.  HENT:     Head: Normocephalic and atraumatic.     Right Ear: Tympanic membrane and external ear normal.     Left Ear: Tympanic membrane and external ear normal.     Nose: Nose normal.     Mouth/Throat:     Mouth: Mucous membranes are moist.     Pharynx: Oropharynx is clear.  Eyes:     Conjunctiva/sclera: Conjunctivae normal.    Cardiovascular:     Rate and Rhythm: Normal rate and regular rhythm.     Heart sounds: Normal heart sounds. No murmur heard.   Pulmonary:     Effort: Pulmonary effort is normal.     Breath sounds: Normal breath sounds. No wheezing, rhonchi or rales.  Musculoskeletal:     Cervical back: Neck supple.  Skin:    General: Skin is warm.     Findings: No rash.  Neurological:     Mental Status: She is alert and oriented to person, place, and time.  Psychiatric:        Behavior: Behavior normal.    Diagnostics: Patient did not react to histamine control therefore was rescheduled to next week.   Previous notes and tests were reviewed. The plan was reviewed with the patient/family, and all questions/concerned were addressed.  It was my pleasure to see Mikayah today and participate in her care. Please feel free to contact me with any questions or concerns.  Sincerely,  Rexene Alberts, DO Allergy & Immunology  [facility] office: [phone] [place] office: [phone]

## 2019-12-25 NOTE — Progress Notes (Deleted)
Follow Up Note  RE: Mary MARTINE MRN: [id number] DOB: [birth year] Date of Office Visit: 12/25/2019  Referring provider: Marin Olp, MD Primary care provider: Marin Olp, MD  Chief Complaint: No chief complaint on file.  Assessment and Plan: [name] is a 59 y.o. female with: No problem-specific Assessment & Plan notes found for this encounter.  No follow-ups on file.  Plan: Challenge drug: pfizer covid-19 vaccine Challenge as per protocol: {Blank single:19197::"Passed","Failed"} Total time: ***  For next 24 hours monitor for hives, swelling, shortness of breath and dizziness. If you see these symptoms, use Benadryl for mild symptoms and epinephrine for more severe symptoms and call 911.  If no adverse symptoms in the next 24 hours then will take off drug from allergy list.  History of Present Illness: I had the pleasure of seeing [name] for a follow up visit at the [facility] [place] on 12/25/2019. She is a 59 y.o. female, who is being followed for drug allergies, hymenoptera allergy, history of urticaria allergic rhinitis. Her previous allergy office visit was on 07/09/2019 with Dr. Maudie Mercury. Today she is here for pfizer COVID-19 drug challenge.   History of Reaction:   Patient has history of reactions to depo medrol injections which contain polysorbate 80 and concerned about receiving the COVID-19 vaccine. She is currently working and would like to receive the vaccine if able to.   Last miralax reaction was 10 years - broke out in hives in her mouth after she had miralax.    Any known reactions to polyethylene glycol or polysorbate?   First reaction - She had reaction to depo medrol injection in 2000 which was given for adhesive capsulitis. She developed wheezing and whole body hives within 15 minutes of the injection. She received a shot of Benadryl and symptoms resolved within 1 hour. This was the first time she had an depo  injection.  Second reaction - Patient saw orthopedics a few years later for her adhesive capsulitis She received 2 depo injections in the arm and within minutes she developed whole body hives, sneezing, throat closure. Patient was taken to the ER and was treated with IV solumedrol, IV Pepcid, benadryl. Patient was observed in the ER for about 8 hours. She does not remember being treated with epinephrine.   Third reaction - Patient went to see an allergist in 2003 and had skin testing done which was positive to dust mites and mold. ? Patient was given an IM depo after the allergy testing and within minutes she developed whole body hives, difficulty breathing and shortness of breath. She was treated with epinephrine and symptoms resolved within few hours.  ? She did have some hives on and off for a few weeks afterwards. Denies having any reactions to the skin testing.   Drug-induced anaphylaxis 3 separate reactions to depo medrol injections. The first reaction was mild and symptoms resolved with benadryl. The second reaction required ER visit with IV medications but no epinephrine. The third reaction was treated with epinephrine due to whole body hives and shortness of breath. Patient also had pruritus after taking Miralax in the past. She had flu, shingles, tetanus vaccines in the past with no issues and some contain polysorbate 80. Patient would like to get the COVID-19 vaccine if eligible.   Discussed with patient at length that given her clinical history, there is no need to undergo the skin testing that we offer as her history confirms an Ige mediated reactions to  depo medrol which contain polysorbate 80.   Given her history, I recommend that she undergoes a graded challenge/desensitization to either the [employer] vaccine in an allergist's office. Both of these vaccines contain PEG 2000 and not polysorbate 80.   Our office is in the process of obtaining authorization to be eligible to give  the Moderna vaccine.  I advise against the J&J or Astrazeneca vaccine which both contain polysorbate 80.  Meanwhile patient can call the nearby academic centers to see if their allergy department is offering COVID-19 vaccine graded challenges/desensitizations.   Continue to avoid polysorbate 80 products.   Continue to avoid latex, depo medrol injections, doxycycline, Miralax.   Interval History: Patient has not been ill, she has not had any accidental exposures to the culprit medication.   Recent/Current History: Pulmonary disease: no Cardiac disease: no Respiratory infection: no Rash: no Itch: no Swelling: no Cough: no Shortness of breath: no Runny/stuffy nose: no Itchy eyes: no Beta-blocker use: no  Patient/guardian was informed of the test procedure with verbalized understanding of the risk of anaphylaxis. Consent was signed.   Last antihistamine use: none Last beta-blocker use: no  Medication List:  Current Outpatient Medications  Medication Sig Dispense Refill  . b complex vitamins tablet Take 1 tablet by mouth daily.    . Cholecalciferol (VITAMIN D) 2000 UNITS CAPS Take by mouth daily.    . Coenzyme Q10 (COQ10 PO) Take 100 mg by mouth 2 (two) times daily.    . cyanocobalamin (,VITAMIN B-12,) 1000 MCG/ML injection 1000 mcg injection once per month. 10 mL 3  . EPINEPHrine 0.3 mg/0.3 mL IJ SOAJ injection INJECT 0.3 MLS INTO THE MUSCLE ONCE FOR 1 DOSE. (FOR BEE STING ALLERGY) 2 each 0  . escitalopram (LEXAPRO) 5 MG tablet Take 1 tablet (5 mg total) by mouth daily. 90 tablet 3  . HYDROcodone-acetaminophen (NORCO) 5-325 MG tablet Take 1-2 tablets by mouth every 6 (six) hours as needed for moderate pain. 40 tablet 0  . magnesium oxide (MAG-OX) 400 MG tablet Take 400 mg by mouth daily.    . Probiotic Product (PROBIOTIC DAILY PO) Take by mouth daily.     No current facility-administered medications for this visit.   Allergies: Allergies  Allergen Reactions  . Bee Venom  Anaphylaxis  . Other Anaphylaxis  . Polysorbate Anaphylaxis    "Polysorbate 80 preservative"  . Latex Hives  . Lyrica [Pregabalin] Nausea And Vomiting and Other (See Comments)    Dizziness   . Methylprednisolone Other (See Comments)    Injection only - has polysorbate 80 preservative  . Miralax [Polyethylene Glycol]     pruritus  . Doxycycline Hyclate Rash   I reviewed her past medical history, social history, family history, and environmental history and no significant changes have been reported from her previous visit.  Review of Systems  Constitutional: Negative for appetite change, chills, fever and unexpected weight change.  HENT: Negative for congestion and rhinorrhea.   Eyes: Negative for itching.  Respiratory: Negative for cough, chest tightness, shortness of breath and wheezing.   Cardiovascular: Negative for chest pain.  Gastrointestinal: Negative for abdominal pain.  Genitourinary: Negative for difficulty urinating.  Skin: Negative for rash.  Allergic/Immunologic: Positive for environmental allergies.  Neurological: Negative for headaches.   Objective: There were no vitals taken for this visit. There is no height or weight on file to calculate BMI. Physical Exam Vitals and nursing note reviewed. Exam conducted with a chaperone present.  Constitutional:  Appearance: Normal appearance. She is well-developed.  HENT:     Head: Normocephalic and atraumatic.     Right Ear: External ear normal.     Left Ear: External ear normal.     Nose: Nose normal.     Mouth/Throat:     Mouth: Mucous membranes are moist.     Pharynx: Oropharynx is clear.  Eyes:     Conjunctiva/sclera: Conjunctivae normal.  Cardiovascular:     Rate and Rhythm: Normal rate and regular rhythm.     Heart sounds: Normal heart sounds. No murmur heard.   Pulmonary:     Effort: Pulmonary effort is normal.     Breath sounds: Normal breath sounds. No wheezing, rhonchi or rales.  Musculoskeletal:      Cervical back: Neck supple.  Skin:    General: Skin is warm.     Findings: No rash.  Neurological:     Mental Status: She is alert and oriented to person, place, and time.  Psychiatric:        Behavior: Behavior normal.     Diagnostics: Skin Testing: {Blank single:19197::"None","Deferred due to recent antihistamines use"}. Positive test to: ***. Negative test to: ***.  Results discussed with patient/family.   Previous notes and tests were reviewed. The plan was reviewed with the patient/family, and all questions/concerned were addressed.  It was my pleasure to see [name] today and participate in her care. Please feel free to contact me with any questions or concerns.  Sincerely,  Rexene Alberts, DO Allergy & Immunology  [facility] office: [phone] [place] office: [phone]

## 2019-12-28 ENCOUNTER — Ambulatory Visit: Payer: 59

## 2019-12-28 ENCOUNTER — Encounter: Payer: Self-pay | Admitting: Allergy & Immunology

## 2019-12-28 ENCOUNTER — Ambulatory Visit (INDEPENDENT_AMBULATORY_CARE_PROVIDER_SITE_OTHER): Payer: 59 | Admitting: Allergy & Immunology

## 2019-12-28 ENCOUNTER — Other Ambulatory Visit: Payer: Self-pay

## 2019-12-28 VITALS — BP 132/70 | HR 58 | Temp 97.2°F | Resp 16

## 2019-12-28 DIAGNOSIS — T50Z95D Adverse effect of other vaccines and biological substances, subsequent encounter: Secondary | ICD-10-CM

## 2019-12-28 DIAGNOSIS — Z23 Encounter for immunization: Secondary | ICD-10-CM

## 2019-12-28 DIAGNOSIS — J3089 Other allergic rhinitis: Secondary | ICD-10-CM

## 2019-12-28 DIAGNOSIS — Z888 Allergy status to other drugs, medicaments and biological substances status: Secondary | ICD-10-CM | POA: Diagnosis not present

## 2019-12-28 DIAGNOSIS — T8052XD Anaphylactic reaction due to vaccination, subsequent encounter: Secondary | ICD-10-CM

## 2019-12-28 DIAGNOSIS — T50B95D Adverse effect of other viral vaccines, subsequent encounter: Secondary | ICD-10-CM

## 2019-12-28 NOTE — Progress Notes (Signed)
This encounter was created in error - please disregard.

## 2019-12-28 NOTE — Patient Instructions (Signed)
1. Vaccines and biological substances causing adverse effect - You tolerated the testing and the majority of the Miralax oral challenge (we did not do the last dose). - I am not sure why your testing to the polysorbate containing triamcinolone was negative since you have clearly reacted to it in the past, but I would obviously stay away from that.  - You even tolerated the vaccination as well (with the help of a Zyrtec !!). - Feel free to take a Zyrtec 1-2 times daily for the next few days, if needed. - We also have an on-call physician 24 hours a day, 7 days a week (it is Dr. Maudie Mercury this week, in fact).  2. Follow up in three weeks for your next vaccine at 8:30am. You can take a Zyrtec that morning to make sure that you tolerate it without a problem!    Please inform us of any Emergency Department visits, hospitalizations, or changes in symptoms. Call us before going to the ED for breathing or allergy symptoms since we might be able to fit you in for a sick visit. Feel free to contact us anytime with any questions, problems, or concerns.  It was a pleasure to meet you today!  Websites that have reliable patient information: 1. American Academy of Asthma, Allergy, and Immunology: www.aaaai.org 2. Food Allergy Research and Education (FARE): foodallergy.org 3. Mothers of Asthmatics: http://www.asthmacommunitynetwork.org 4. American College of Allergy, Asthma, and Immunology: www.acaai.org   COVID-19 Vaccine Information can be found at: ShippingScam.co.uk For questions related to vaccine distribution or appointments, please email vaccine@[email or web address] or call 5743828900.     "Like" [place] on Facebook and Instagram for our latest updates!        Make sure you are registered to vote! If you have moved or changed any of your contact information, you will need to get this updated before voting!  In some cases, you MAY be able to  register to vote online: [email or web address]

## 2019-12-28 NOTE — Progress Notes (Signed)
FOLLOW UP  Date of Service/Encounter:  12/28/19   Assessment:   Vaccines and biological substances causing adverse effect - negative testing and tolerated dose #1 of the [place] vaccination today   Plan/Recommendations:   1. Vaccines and biological substances causing adverse effect - You tolerated the testing and the majority of the Miralax oral challenge (we did not do the last dose). - I am not sure why your testing to the polysorbate containing triamcinolone was negative since you have clearly reacted to it in the past, but I would obviously stay away from that.  - You even tolerated the vaccination as well (with the help of a Zyrtec !!). - Feel free to take a Zyrtec 1-2 times daily for the next few days, if needed. - We also have an on-call physician 24 hours a day, 7 days a week (it is Dr. Maudie Mercury this week, in fact).  2. Follow up in three weeks for your next vaccine at 8:30am. You can take a Zyrtec that morning to make sure that you tolerate it without a problem!   Subjective:   SHANIE MAUZY is a 59 y.o. female presenting today for follow up of  Chief Complaint  Patient presents with  . Food/Drug Challenge    Comp Testing    [place] Sarinana Mohammed has a history of the following: Patient Active Problem List   Diagnosis Date Noted  . History of total hysterectomy 10/14/2019  . Drug-induced anaphylaxis 07/09/2019  . Allergy to hymenoptera venom 07/09/2019  . History of urticaria 07/09/2019  . Bee sting-induced anaphylaxis 10/11/2017  . Vitamin D deficiency 10/11/2017  . Trigeminal neuralgia 09/30/2016  . Migraine with aura and without status migrainosus 09/30/2016  . Abnormal MRA, brain 05/15/2016  . Sensorineural hearing loss (SNHL) of right ear 05/15/2016  . Aortic root aneurysm ([place]) 05/03/2016  . Dyspnea 12/14/2014  . Other allergic rhinitis 12/14/2014  . Genetic testing 12/08/2014  . History of right breast cancer 08/23/2014  . Loss of transverse plantar arch  07/16/2014  . Hyperlipidemia 11/28/2010  . Anxiety 11/28/2010  . Hereditary and idiopathic peripheral neuropathy 01/10/2010  . Tinnitus 01/10/2010  . HEARING LOSS, BILATERAL 01/10/2010  . Vitamin B12 deficiency 01/09/2010  . Fatigue 07/14/2008  . History of skin cancer 05/26/2007    History obtained from: chart review and patient and her husband.  Chane is a 59 y.o. female presenting for skin testing.  She has a history of a known polysorbate allergy.  She was last seen by Dr. Maudie Mercury on Friday, August 27 in [place].  At that time, the decision was made to do component testing because of some additional components of her history.  Unfortunately, a histamine was nonreactive because she had recently taken Pepcid.  Therefore she presents today for Covid vaccine component testing and administration of [employer] vaccine.  Since last visit, she has done very well.  She has been off of all of her antihistamines.  She also read on Internet that her vitamin C might have some antihistamine properties, so she stopped that on Friday as well.  She did not want anything to get in the way of her testing.  She is very motivated to get the vaccine.  Otherwise, there have been no changes to her past medical history, surgical history, family history, or social history.    Review of Systems  Constitutional: Negative.  Negative for chills, fever, malaise/fatigue and weight loss.  HENT: Negative.  Negative for congestion, ear discharge and  ear pain.   Eyes: Negative for pain, discharge and redness.  Respiratory: Negative for cough, sputum production, shortness of breath and wheezing.   Cardiovascular: Negative.  Negative for chest pain and palpitations.  Gastrointestinal: Negative for abdominal pain, constipation, diarrhea, heartburn, nausea and vomiting.  Skin: Negative.  Negative for itching and rash.  Neurological: Negative for dizziness and headaches.  Endo/Heme/Allergies: Negative for environmental  allergies. Does not bruise/bleed easily.  Psychiatric/Behavioral: Positive for substance abuse.       Objective:   Blood pressure 132/70, pulse (!) 58, temperature (!) 97.2 F (36.2 C), temperature source Temporal, resp. rate 16. There is no height or weight on file to calculate BMI.   Physical Exam: deferred since this was a skin testing and challenge appointment only  Diagnostic studies:     Allergy Studies:     COVID Vaccine Testing - 12/28/19 1104      Test Information   Consent Yes    Medications Miralax    Triamcinolone Lot # [id number]    Triamcinolone EXP DATE 02/28/21    Methylprednisolone Lot # [id number]    Methylprednisolone EXP DATE 06/28/20    Miralax Lot # [id number]    Miralax EXP DATE 08/28/20      Pre Test Vitals   BP 132/70    Pulse 58    Resp 16      SKIN PRICK TESTING - Arm #1   Location Right Arm    Select Select      HISTAMINE (1mg /mL) Skin Prick Arm #1   Histamine Time Testing Placed 1006    Histamine Wheal Negative      Control (negative - HSA) Skin Prick Arm #1   Control Time Testing Placed 1006    Control Wheal Negative      Triamcinolone (40mg /mL) Skin Prick Arm #1   Triamcinolone Time Testing Placed 1006    Triamcinolone Wheal Negative      Methylprednisolone (40mg /mL) Skin Prick Arm #1   Methylprednisolone Time Testing Placed 1006    Methylprednisolone Wheal Negative      Miralax (1:100 or 1.7 mg/mL) Skin Prick Arm #1   Miralax Time Testing Placed 1006    Miralax Wheal Negative      Miralax (1:10 or 17mg /mL) Skin Prick Arm #1   Miralax Time Testing Placed 1043    Miralax Wheal Negative      Miralax (1:1 or 170mg /mL) Skin Prick Arm #1   Miralax Time Testing Placed 1103    Miralax Wheal Negative      INTRADERMAL TESTING - Arm #2   Location Left Arm    Select Select      Control (negative - HSA) Intradermal Arm #2   Control Time Testing Placed  1043    Control Wheal Negative      Triamcinolone (1:100) Intradermal Arm #2    Triamcinolone Time Testing Placed 1043    Triamcinolone Wheal Negative      Methylprednisolone (1:100) Intradermal Arm #2   Methylprednisolone Time Testing Placed  1043    Methylprednisolone Wheal Negative      Triamcinolone (1:10) Intradermal Arm #2   Triamcinolone Time Testing Placed 1103    Triamcinolone Wheal Negative      Methylprednisolone (1:10) Intradermal Arm #2   Methylprednisolone Time Testing Placed  1103    Methylprednisolone Wheal Negative      Triamcinolone (1:1) Intradermal Arm #2   Triamcinolone Time Testing Placed 1120      Skin Prick/Intradermal Post Testing   Skin Prick/Intradermal  Testing Total Pricks 13      ORAL CHALLENGE TESTING   Select Select          Following, she was developing some throat irritation/itchiness.  Her vitals were normal and her oropharynx was within normal limits.  She was not wheezing.  Because of the symptoms, we decided to skip the last dose of MiraLAX in hopes that she would be able to tolerate the Pfizer vaccine itself.  We just did not want her to react to the last dose of MiraLAX and then have her Pfizer vaccine unable to be given.   We therefore gave her Pfizer vaccine 0.05 mL and waited for 30 minutes.  Her vitals were normal after this and we gave the remaining 0.25 mL.  We watched her for another 25 minutes and she did quite well.    Allergy testing results were read and interpreted by myself, documented by clinical staff.      Salvatore Marvel, MD  Allergy and [facility]

## 2019-12-30 NOTE — Progress Notes (Signed)
   Covid-19 Vaccination Clinic  Name:  [name]    MRN: [id number] DOB: 01/16/1961  12/30/2019  [name] was observed post Covid-19 immunization for 30 minutes based on pre-vaccination screening without incident. She was provided with Vaccine Information Sheet and instruction to access the V-Safe system.   [name] was instructed to call 911 with any severe reactions post vaccine: Marland Kitchen Difficulty breathing  . Swelling of face and throat  . A fast heartbeat  . A bad rash all over body  . Dizziness and weakness

## 2020-01-01 ENCOUNTER — Other Ambulatory Visit: Payer: Self-pay

## 2020-01-18 ENCOUNTER — Other Ambulatory Visit: Payer: Self-pay

## 2020-01-18 ENCOUNTER — Ambulatory Visit (INDEPENDENT_AMBULATORY_CARE_PROVIDER_SITE_OTHER): Payer: 59

## 2020-01-18 DIAGNOSIS — Z23 Encounter for immunization: Secondary | ICD-10-CM | POA: Diagnosis not present

## 2020-01-18 NOTE — Progress Notes (Signed)
   Covid-19 Vaccination Clinic  Name:  [name]    MRN: [id number] DOB: Dec 22, 1960  01/18/2020  [name] was observed post Covid-19 immunization for 15 minutes without incident. She was provided with Vaccine Information Sheet and instruction to access the V-Safe system.   [name] was instructed to call 911 with any severe reactions post vaccine: Marland Kitchen Difficulty breathing  . Swelling of face and throat  . A fast heartbeat  . A bad rash all over body  . Dizziness and weakness   Immunizations Administered    Name Date Dose VIS Date Route   Pfizer COVID-19 Vaccine 01/18/2020  8:18 AM 0.3 mL 06/24/2018 Intramuscular   Manufacturer: [employer]   Lot: [id number]   [place]: [id number]

## 2020-01-26 MED FILL — ESCITALOPRAM 5 MG TABLET: 5 | 90 days supply | Qty: 90 | Fill #1

## 2020-01-28 ENCOUNTER — Encounter: Payer: Self-pay | Admitting: Surgery

## 2020-02-02 ENCOUNTER — Encounter: Payer: Self-pay | Admitting: Family Medicine

## 2020-02-02 ENCOUNTER — Other Ambulatory Visit: Payer: Self-pay

## 2020-02-02 ENCOUNTER — Ambulatory Visit (INDEPENDENT_AMBULATORY_CARE_PROVIDER_SITE_OTHER): Payer: 59

## 2020-02-02 DIAGNOSIS — Z23 Encounter for immunization: Secondary | ICD-10-CM | POA: Diagnosis not present

## 2020-02-22 ENCOUNTER — Ambulatory Visit: Payer: 59

## 2020-02-22 DIAGNOSIS — H903 Sensorineural hearing loss, bilateral: Secondary | ICD-10-CM | POA: Diagnosis not present

## 2020-02-29 ENCOUNTER — Ambulatory Visit
Admission: RE | Admit: 2020-02-29 | Discharge: 2020-02-29 | Disposition: A | Discharge status: Home / Self Care | Payer: 59 | Source: Ambulatory Visit | Attending: Obstetrics and Gynecology | Admitting: Obstetrics and Gynecology

## 2020-02-29 ENCOUNTER — Other Ambulatory Visit: Payer: Self-pay

## 2020-02-29 DIAGNOSIS — Z1231 Encounter for screening mammogram for malignant neoplasm of breast: Secondary | ICD-10-CM

## 2020-02-29 HISTORY — DX: Malignant neoplasm of unspecified site of unspecified female breast: C50.919

## 2020-04-09 ENCOUNTER — Other Ambulatory Visit: Payer: Self-pay | Admitting: Neurology

## 2020-04-09 MED ORDER — HYDROCODONE-ACETAMINOPHEN 5-325 MG PO TABS: 1.0000 | ORAL_TABLET | Freq: Four times a day (QID) | ORAL | 0 refills | Status: DC | PRN

## 2020-04-09 MED ORDER — CYCLOBENZAPRINE HCL 10 MG PO TABS: 10.0000 mg | ORAL_TABLET | Freq: Three times a day (TID) | ORAL | 3 refills | Status: DC | PRN

## 2020-04-09 MED FILL — HYDROCODON-APAP 5-325: 5-325 | 11 days supply | Qty: 90 | Fill #0

## 2020-04-09 MED FILL — CYCLOBENZAPRINE HCL 10 MG T: 10 | 30 days supply | Qty: 90 | Fill #0

## 2020-04-25 MED FILL — ESCITALOPRAM 5 MG TABLET: 5 | 90 days supply | Qty: 90 | Fill #2

## 2020-05-19 DIAGNOSIS — L905 Scar conditions and fibrosis of skin: Secondary | ICD-10-CM | POA: Diagnosis not present

## 2020-05-19 DIAGNOSIS — D229 Melanocytic nevi, unspecified: Secondary | ICD-10-CM | POA: Diagnosis not present

## 2020-05-19 DIAGNOSIS — L718 Other rosacea: Secondary | ICD-10-CM | POA: Diagnosis not present

## 2020-05-19 DIAGNOSIS — L853 Xerosis cutis: Secondary | ICD-10-CM | POA: Diagnosis not present

## 2020-05-19 DIAGNOSIS — R202 Paresthesia of skin: Secondary | ICD-10-CM | POA: Diagnosis not present

## 2020-05-19 DIAGNOSIS — L648 Other androgenic alopecia: Secondary | ICD-10-CM | POA: Diagnosis not present

## 2020-05-19 DIAGNOSIS — Z85828 Personal history of other malignant neoplasm of skin: Secondary | ICD-10-CM | POA: Diagnosis not present

## 2020-05-19 DIAGNOSIS — L821 Other seborrheic keratosis: Secondary | ICD-10-CM | POA: Diagnosis not present

## 2020-05-19 DIAGNOSIS — L814 Other melanin hyperpigmentation: Secondary | ICD-10-CM | POA: Diagnosis not present

## 2020-06-02 ENCOUNTER — Encounter: Payer: Self-pay | Admitting: Allergy & Immunology

## 2020-07-05 ENCOUNTER — Encounter: Payer: Self-pay | Admitting: Allergy & Immunology

## 2020-07-12 ENCOUNTER — Encounter: Payer: Self-pay | Admitting: Allergy & Immunology

## 2020-07-12 ENCOUNTER — Other Ambulatory Visit: Payer: Self-pay | Admitting: Allergy & Immunology

## 2020-07-12 ENCOUNTER — Ambulatory Visit (INDEPENDENT_AMBULATORY_CARE_PROVIDER_SITE_OTHER): Payer: 59 | Admitting: Allergy & Immunology

## 2020-07-12 ENCOUNTER — Ambulatory Visit: Payer: Self-pay | Admitting: *Deleted

## 2020-07-12 ENCOUNTER — Other Ambulatory Visit: Payer: Self-pay

## 2020-07-12 VITALS — BP 150/80 | HR 71 | Temp 98.1°F | Resp 14 | Ht 66.5 in | Wt 139.0 lb

## 2020-07-12 DIAGNOSIS — T50B95D Adverse effect of other viral vaccines, subsequent encounter: Secondary | ICD-10-CM | POA: Diagnosis not present

## 2020-07-12 DIAGNOSIS — T50905D Adverse effect of unspecified drugs, medicaments and biological substances, subsequent encounter: Secondary | ICD-10-CM | POA: Diagnosis not present

## 2020-07-12 DIAGNOSIS — Z23 Encounter for immunization: Secondary | ICD-10-CM

## 2020-07-12 DIAGNOSIS — T50Z95D Adverse effect of other vaccines and biological substances, subsequent encounter: Secondary | ICD-10-CM

## 2020-07-12 MED ORDER — EPINEPHRINE 0.3 MG/0.3ML IJ SOAJ: INTRAMUSCULAR | 0 refills | Status: DC

## 2020-07-12 NOTE — Progress Notes (Signed)
FOLLOW UP  Date of Service/Encounter:  07/12/20   Assessment:   Vaccines and biological substances causing adverse effect - negative testing and tolerated dose #1 of the [place] vaccination today   Plan/Recommendations:   1. Vaccines and biological substances causing adverse effect - You tolerated the [place] booster today!  - Continue with Zyrtec twice daily for another few days.  - Obviously, call us with problems! - My work cell is [phone].  2. Follow up as needed.   Subjective:   [name] is a 60 y.o. female presenting today for follow up of  Chief Complaint  Patient presents with  . Immunizations    Pfizer vaccine booster    [name] has a history of the following: Patient Active Problem List   Diagnosis Date Noted  . History of total hysterectomy 10/14/2019  . Drug-induced anaphylaxis 07/09/2019  . Allergy to hymenoptera venom 07/09/2019  . History of urticaria 07/09/2019  . Bee sting-induced anaphylaxis 10/11/2017  . Vitamin D deficiency 10/11/2017  . Trigeminal neuralgia 09/30/2016  . Migraine with aura and without status migrainosus 09/30/2016  . Abnormal MRA, brain 05/15/2016  . Sensorineural hearing loss (SNHL) of right ear 05/15/2016  . Aortic root aneurysm ([place]) 05/03/2016  . Dyspnea 12/14/2014  . Other allergic rhinitis 12/14/2014  . Genetic testing 12/08/2014  . History of right breast cancer 08/23/2014  . Loss of transverse plantar arch 07/16/2014  . Hyperlipidemia 11/28/2010  . Anxiety 11/28/2010  . Hereditary and idiopathic peripheral neuropathy 01/10/2010  . Tinnitus 01/10/2010  . HEARING LOSS, BILATERAL 01/10/2010  . Vitamin B12 deficiency 01/09/2010  . Fatigue 07/14/2008  . History of skin cancer 05/26/2007    History obtained from: chart review and patient.  [name] is a 60 y.o. female presenting for a drug challenge.  I last saw her in August 2021.  At that time, she tolerated her component testing to the  COVID-19 vaccine and tolerated the first vaccine well with the help of cetirizine.  I recommended taking Zyrtec 1-2 times daily for the next few days.  She also came 3 weeks later for her second vaccination.  She received Pfizer for her primary series.  In the interim, she wanted to come in for the booster vaccination.  She did tolerate her flu shot without a problem last fall.  She reached out to [place] last week and we recommended starting Zyrtec twice a day starting Saturday.  She has done this.  She does feel little bit loopy from the Zyrtec.  She also has some anxiety about the vaccine today.  Overall, she is feeling fairly good.  Otherwise, there have been no changes to her past medical history, surgical history, family history, or social history.    Review of Systems  Constitutional: Negative.  Negative for fever, malaise/fatigue and weight loss.  HENT: Negative.  Negative for congestion, ear discharge and ear pain.   Eyes: Negative for pain, discharge and redness.  Respiratory: Negative for cough, sputum production, shortness of breath and wheezing.   Cardiovascular: Negative.  Negative for chest pain and palpitations.  Gastrointestinal: Negative for abdominal pain and heartburn.  Skin: Negative.  Negative for itching and rash.  Neurological: Negative for dizziness and headaches.  Endo/Heme/Allergies: Negative for environmental allergies. Does not bruise/bleed easily.       Objective:   Blood pressure (!) 150/80, pulse 71, temperature 98.1 F (36.7 C), resp. rate 14, height 5' 6.5" (1.689 m), weight 139 lb (63 kg), SpO2 98 %.  Body mass index is 22.1 kg/m.   Physical Exam:  Physical Exam Constitutional:      Appearance: She is well-developed.     Comments: Very pleasant talkative female.  HENT:     Head: Normocephalic and atraumatic.     Right Ear: Tympanic membrane, ear canal and external ear normal.     Left Ear: Tympanic membrane, ear canal and external ear normal.      Nose: No nasal deformity, septal deviation, mucosal edema or rhinorrhea.     Right Turbinates: Enlarged and swollen.     Left Turbinates: Enlarged and swollen.     Right Sinus: No maxillary sinus tenderness or frontal sinus tenderness.     Left Sinus: No maxillary sinus tenderness or frontal sinus tenderness.     Comments: Turbinates enlarged.  No nasal polyps.    Mouth/Throat:     Mouth: Mucous membranes are not pale and not dry.     Pharynx: Uvula midline.  Eyes:     General:        Right eye: No discharge.        Left eye: No discharge.     Conjunctiva/sclera: Conjunctivae normal.     Right eye: Right conjunctiva is not injected. No chemosis.    Left eye: Left conjunctiva is not injected. No chemosis.    Pupils: Pupils are equal, round, and reactive to light.  Cardiovascular:     Rate and Rhythm: Normal rate and regular rhythm.     Heart sounds: Normal heart sounds.  Pulmonary:     Effort: Pulmonary effort is normal. No tachypnea, accessory muscle usage or respiratory distress.     Breath sounds: Normal breath sounds. No wheezing, rhonchi or rales.     Comments: Moving air well in all lung fields.  No increased work of breathing. Chest:     Chest wall: No tenderness.  Lymphadenopathy:     Cervical: No cervical adenopathy.  Skin:    General: Skin is warm.     Capillary Refill: Capillary refill takes less than 2 seconds.     Coloration: Skin is not pale.     Findings: No abrasion, erythema, petechiae or rash. Rash is not papular, urticarial or vesicular.  Neurological:     Mental Status: She is alert.  Psychiatric:        Behavior: Behavior is cooperative.       Diagnostic studies:  She received 50% of the dose and was monitored for 30 minutes.  She tolerated this well and received the remainder of the dose.  She tolerated this well without adverse event.  Vitals were within normal limits at discharge.    Salvatore Marvel, MD  Allergy and [facility]

## 2020-07-12 NOTE — Patient Instructions (Signed)
1. Vaccines and biological substances causing adverse effect - You tolerated the [place] booster today!  - Continue with Zyrtec twice daily for another few days.  - Obviously, call us with problems! - My work cell is [phone].  2. No follow-ups on file.    Please inform us of any Emergency Department visits, hospitalizations, or changes in symptoms. Call us before going to the ED for breathing or allergy symptoms since we might be able to fit you in for a sick visit. Feel free to contact us anytime with any questions, problems, or concerns.  It was a pleasure to see you and your husband again today!  Websites that have reliable patient information: 1. American Academy of Asthma, Allergy, and Immunology: www.aaaai.org 2. Food Allergy Research and Education (FARE): foodallergy.org 3. Mothers of Asthmatics: http://www.asthmacommunitynetwork.org 4. American College of Allergy, Asthma, and Immunology: www.acaai.org   COVID-19 Vaccine Information can be found at: [email or web address].[email or web address].uk For questions related to vaccine distribution or appointments, please email [email or web address] or call [phone].   We realize that you might be concerned about having an allergic reaction to the COVID19 vaccines. To help with that concern, WE ARE OFFERING THE COVID19 VACCINES IN OUR OFFICE! Ask the front desk for dates!     "Like" [place] on Facebook and Instagram for our latest updates!      A healthy democracy works best when [employer] participate! Make sure you are registered to vote! If you have moved or changed any of your contact information, you will need to get this updated before voting!  In some cases, you MAY be able to register to vote online: [email or web address]

## 2020-07-21 ENCOUNTER — Other Ambulatory Visit (HOSPITAL_BASED_OUTPATIENT_CLINIC_OR_DEPARTMENT_OTHER): Payer: Self-pay

## 2020-07-24 MED FILL — ESCITALOPRAM 5 MG TABLET: 5 | 90 days supply | Qty: 90 | Fill #3

## 2020-08-09 DIAGNOSIS — Z20822 Contact with and (suspected) exposure to covid-19: Secondary | ICD-10-CM | POA: Diagnosis not present

## 2020-09-19 ENCOUNTER — Telehealth: Payer: Self-pay | Admitting: *Deleted

## 2020-09-19 NOTE — Telephone Encounter (Signed)
Please advise. Thank You

## 2020-09-19 NOTE — Telephone Encounter (Signed)
Called and left a voicemail asking for patient to return call to discuss. I believe I may have administered this vaccine when she was in clinic but I want to obtain the information from her vaccine card if it is listed to ensure proper documentation.

## 2020-09-19 NOTE — Telephone Encounter (Signed)
-----   Message from [place] sent at 09/19/2020 11:59 AM EDT ----- Regarding: [place] booster info Hello,  I don't see documentation that Nahia received a booster on 07/12/2020 other then Dr. Gillermina Hu note on the visit. I need to bill for this. Not sure what needs to be done here because I don't want to make a new encounter or drop a charge. I need to make a correction to a claim I have already. Basically I just need it documented. Hope this makes sense.  Please call me if any questions, Candice

## 2020-09-27 NOTE — Telephone Encounter (Signed)
Patient called and spoke with me and I was able to document and close the encounter. Patient is aware.

## 2020-09-27 NOTE — Progress Notes (Signed)
   Covid-19 Vaccination Clinic  Name:  [name]    MRN: [id number] DOB: 1960-09-13  07/12/2020  [name] was observed post Covid-19 immunization for 30 minutes based on pre-vaccination screening without incident. She was provided with Vaccine Information Sheet and instruction to access the V-Safe system.   [name] was instructed to call 911 with any severe reactions post vaccine: Marland Kitchen Difficulty breathing  . Swelling of face and throat  . A fast heartbeat  . A bad rash all over body  . Dizziness and weakness   Immunizations Administered    Name Date Dose VIS Date Route   PFIZER Comrnaty([place]) Covid-19 Vaccine 07/12/2020 11:15 AM 0.3 mL 04/07/2020 Intramuscular   Manufacturer: [place]   Lot: [id number]   [place]: [phone]

## 2020-10-18 ENCOUNTER — Ambulatory Visit (INDEPENDENT_AMBULATORY_CARE_PROVIDER_SITE_OTHER): Payer: 59 | Admitting: Family Medicine

## 2020-10-18 ENCOUNTER — Other Ambulatory Visit: Payer: Self-pay

## 2020-10-18 ENCOUNTER — Encounter: Payer: Self-pay | Admitting: Family Medicine

## 2020-10-18 ENCOUNTER — Other Ambulatory Visit (HOSPITAL_BASED_OUTPATIENT_CLINIC_OR_DEPARTMENT_OTHER): Payer: Self-pay

## 2020-10-18 VITALS — BP 114/76 | HR 69 | Temp 97.4°F | Ht 67.0 in | Wt 136.8 lb

## 2020-10-18 DIAGNOSIS — G500 Trigeminal neuralgia: Secondary | ICD-10-CM

## 2020-10-18 DIAGNOSIS — E538 Deficiency of other specified B group vitamins: Secondary | ICD-10-CM | POA: Diagnosis not present

## 2020-10-18 DIAGNOSIS — E559 Vitamin D deficiency, unspecified: Secondary | ICD-10-CM | POA: Diagnosis not present

## 2020-10-18 DIAGNOSIS — Z0000 Encounter for general adult medical examination without abnormal findings: Secondary | ICD-10-CM

## 2020-10-18 DIAGNOSIS — I7121 Aneurysm of the ascending aorta, without rupture: Secondary | ICD-10-CM

## 2020-10-18 DIAGNOSIS — I719 Aortic aneurysm of unspecified site, without rupture: Secondary | ICD-10-CM

## 2020-10-18 DIAGNOSIS — E785 Hyperlipidemia, unspecified: Secondary | ICD-10-CM | POA: Diagnosis not present

## 2020-10-18 DIAGNOSIS — F419 Anxiety disorder, unspecified: Secondary | ICD-10-CM | POA: Diagnosis not present

## 2020-10-18 DIAGNOSIS — G43109 Migraine with aura, not intractable, without status migrainosus: Secondary | ICD-10-CM | POA: Diagnosis not present

## 2020-10-18 LAB — LIPID PANEL
Cholesterol: 241 mg/dL — ABNORMAL HIGH (ref 0–200)
HDL: 54.3 mg/dL (ref >39.00)
LDL Cholesterol: 154 mg/dL — ABNORMAL HIGH (ref 0–99)
NonHDL: 186.27
Total CHOL/HDL Ratio: 4
Triglycerides: 161 mg/dL — ABNORMAL HIGH (ref 0.0–149.0)
VLDL: 32.2 mg/dL (ref 0.0–40.0)

## 2020-10-18 LAB — CBC WITH DIFFERENTIAL/PLATELET
Basophils Absolute: 0.1 10*3/uL (ref 0.0–0.1)
Basophils Relative: 1.1 % (ref 0.0–3.0)
Eosinophils Absolute: 0.2 10*3/uL (ref 0.0–0.7)
Eosinophils Relative: 2.8 % (ref 0.0–5.0)
HCT: 42.5 % (ref 36.0–46.0)
Hemoglobin: 14.6 g/dL (ref 12.0–15.0)
Lymphocytes Relative: 22.9 % (ref 12.0–46.0)
Lymphs Abs: 1.5 10*3/uL (ref 0.7–4.0)
MCHC: 34.4 g/dL (ref 30.0–36.0)
MCV: 90.3 fl (ref 78.0–100.0)
Monocytes Absolute: 0.4 10*3/uL (ref 0.1–1.0)
Monocytes Relative: 6.7 % (ref 3.0–12.0)
Neutro Abs: 4.4 10*3/uL (ref 1.4–7.7)
Neutrophils Relative %: 66.5 % (ref 43.0–77.0)
Platelets: 289 10*3/uL (ref 150.0–400.0)
RBC: 4.71 Mil/uL (ref 3.87–5.11)
RDW: 13.2 % (ref 11.5–15.5)
WBC: 6.6 10*3/uL (ref 4.0–10.5)

## 2020-10-18 LAB — COMPREHENSIVE METABOLIC PANEL
ALT: 18 U/L (ref 0–35)
AST: 19 U/L (ref 0–37)
Albumin: 4.5 g/dL (ref 3.5–5.2)
Alkaline Phosphatase: 57 U/L (ref 39–117)
BUN: 17 mg/dL (ref 6–23)
CO2: 28 mEq/L (ref 19–32)
Calcium: 9.5 mg/dL (ref 8.4–10.5)
Chloride: 100 mEq/L (ref 96–112)
Creatinine, Ser: 0.74 mg/dL (ref 0.40–1.20)
GFR: 87.07 mL/min (ref >60.00)
Glucose, Bld: 92 mg/dL (ref 70–99)
Potassium: 4.5 mEq/L (ref 3.5–5.1)
Sodium: 138 mEq/L (ref 135–145)
Total Bilirubin: 0.6 mg/dL (ref 0.2–1.2)
Total Protein: 7.1 g/dL (ref 6.0–8.3)

## 2020-10-18 LAB — VITAMIN D 25 HYDROXY (VIT D DEFICIENCY, FRACTURES): VITD: 36.09 ng/mL (ref 30.00–100.00)

## 2020-10-18 LAB — VITAMIN B12: Vitamin B-12: 274 pg/mL (ref 211–911)

## 2020-10-18 MED ORDER — ESCITALOPRAM OXALATE 5 MG PO TABS
7.5000 mg | ORAL_TABLET | Freq: Every day | ORAL | 3 refills | Status: DC
  Filled 2020-10-18: qty 135, 90d supply, fill #0
  Filled 2021-01-19: qty 135, 90d supply, fill #1
  Filled 2021-06-09: qty 15, 10d supply, fill #2
  Filled 2021-06-09: qty 120, 80d supply, fill #2
  Filled 2021-10-15: qty 100, 66d supply, fill #3
  Filled 2021-10-16: qty 35, 24d supply, fill #3

## 2020-10-18 NOTE — Progress Notes (Addendum)
Phone [phone]   Subjective:  Patient presents today for their annual physical. Chief complaint-noted.   See problem oriented charting- ROS- full  review of systems was completed and negative except for: fatigue, ear pain, post nasal drip, sinus pressure, sneezing, tinnitus, neck pain, seasonal allergies, headaches, light -headedness, nervous/anxious.  The following were reviewed and entered/updated in epic: Past Medical History:  Diagnosis Date   Adhesive capsulitis of shoulder    Aortic aneurysm ([place])    Breast cancer ([place])    Cancer ([place]) 2008   Breast   Congenital anomaly of aortic arch    Labyrinthitis, unspecified    Malignant neoplasm of breast (female), unspecified site    Other B-complex deficiencies    Other malaise and fatigue    Personal history of chemotherapy    Personal history of radiation therapy 2008   Unspecified hearing loss    Unspecified hereditary and idiopathic peripheral neuropathy    Unspecified tinnitus    Patient Active Problem List   Diagnosis Date Noted   Trigeminal neuralgia 09/30/2016    Priority: High   Migraine with aura and without status migrainosus 09/30/2016    Priority: High   Abnormal MRA, brain 05/15/2016    Priority: High   Aortic root aneurysm ([place]) 05/03/2016    Priority: High   History of right breast cancer 08/23/2014    Priority: High   Tinnitus 01/10/2010    Priority: High   Vitamin D deficiency 10/11/2017    Priority: Medium   Hyperlipidemia 11/28/2010    Priority: Medium   Anxiety 11/28/2010    Priority: Medium   HEARING LOSS, BILATERAL 01/10/2010    Priority: Medium   Bee sting-induced anaphylaxis 10/11/2017    Priority: Low   Sensorineural hearing loss (SNHL) of right ear 05/15/2016    Priority: Low   Dyspnea 12/14/2014    Priority: Low   Other allergic rhinitis 12/14/2014    Priority: Low   Genetic testing 12/08/2014    Priority: Low   Loss of transverse plantar arch 07/16/2014    Priority: Low    Hereditary and idiopathic peripheral neuropathy 01/10/2010    Priority: Low   Vitamin B12 deficiency 01/09/2010    Priority: Low   Fatigue 07/14/2008    Priority: Low   History of skin cancer 05/26/2007    Priority: Low   History of total hysterectomy 10/14/2019   Drug-induced anaphylaxis 07/09/2019   Allergy to hymenoptera venom 07/09/2019   History of urticaria 07/09/2019   Past Surgical History:  Procedure Laterality Date   ABDOMINAL HYSTERECTOMY  04/2010   robotic   BASAL CELL CARCINOMA EXCISION  2003   rt eye area   [place]   Left   BREAST LUMPECTOMY Right Oct.29 & Mar 19, 2007   Right   IR GENERIC HISTORICAL  04/16/2016   IR RADIOLOGIST EVAL & MGMT 04/16/2016 [facility]   LAPAROSCOPY  1982   for endometriosis   wisdom teeth extracted      Family History  Problem Relation Age of Onset   Seizures Mother    Migraines Mother    Allergic rhinitis Mother    Hypertension Father    Other Father    Hyperlipidemia Father    Breast cancer Sister    Cancer Sister        breast   Cancer Other        breast   Breast cancer Maternal Grandmother     Medications- reviewed and updated Current Outpatient  Medications  Medication Sig Dispense Refill   b complex vitamins tablet Take 1 tablet by mouth daily.     Cholecalciferol (VITAMIN D) 2000 UNITS CAPS Take by mouth daily.     Coenzyme Q10 (COQ10 PO) Take 100 mg by mouth 2 (two) times daily.     cyanocobalamin (,VITAMIN B-12,) 1000 MCG/ML injection 1000 mcg injection once per month. 10 mL 3   cyclobenzaprine (FLEXERIL) 10 MG tablet TAKE 1 TABLET BY MOUTH 3 TIMES DAILY AS NEEDED FOR MUSCLE SPASMS 90 tablet 3   EPINEPHrine 0.3 mg/0.3 mL IJ SOAJ injection INJECT 1 PEN INTO THE MUSCLE FOR ONE DOSE FOR BEE STING ALLERGY AS DIRECTED 2 each 0   HYDROcodone-acetaminophen (NORCO/VICODIN) 5-325 MG tablet Take 1 tablet by mouth as needed for moderate pain.     magnesium oxide (MAG-OX) 400 MG tablet Take 400  mg by mouth daily.     Probiotic Product (PROBIOTIC DAILY PO) Take by mouth daily.     escitalopram (LEXAPRO) 5 MG tablet Take 1.5 tablets (7.5 mg total) by mouth daily. 135 tablet 3   No current facility-administered medications for this visit.    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 Married '85, no children.    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 Strong interest in herbology and alternative healing.    Right-handed   Objective  Objective:  BP 114/76   Pulse 69   Temp (!) 97.4 F (36.3 C) (Temporal)   Ht 5\' 7"  (1.702 m)   Wt 136 lb 12.8 oz (62.1 kg)   SpO2 98%   BMI 21.43 kg/m  Gen: NAD, resting comfortably HEENT: Mucous membranes are moist. Oropharynx large and normal Neck: no thyromegaly, TMs normal bilaterally, nasal turbinates large and normal, right maxillary and sinus tenderness noted- left side normal. CV: RRR no murmurs rubs or gallops, no carotid bruits Lungs: CTAB no crackles, wheeze, rhonchi Abdomen: soft/nontender/nondistended/normal bowel sounds. No rebound or guarding.  Ext: no edema Skin: warm, dry Neuro: grossly normal, moves all extremities, PERRLA   Assessment and Plan   60 y.o. female presenting for annual physical.  Health Maintenance counseling: 1. Anticipatory guidance: Patient counseled regarding regular dental exams -q6 months advised- she is being cautious due to COVID-19- scheduled to see dentist this week,  eye exams - every 2 years- doing well,  avoiding smoking and second hand smoke , limiting alcohol to 1 beverage per day .   2. Risk factor reduction:  Advised patient of need for regular exercise and diet rich and fruits and vegetables to reduce risk of heart attack and stroke. Exercise-walks 25 miles per week, aerobics 2-3 per week, and meditation. Some fatigue after 80 minute walks- discussed decreasing time down to 20- 40 minutes. Diet-Cooking at home high fiber diet. Healthy weight noted.  Wt Readings from Last 3 Encounters:  10/18/20 136 lb 12.8 oz (62.1 kg)  07/12/20 139 lb (63 kg)  10/14/19 141 lb (64 kg)   3. Immunizations/screenings/ancillary studies- Prevnar 20 would wait until age 64, discussed COVID-19 vaccination #4-she is not yet due Immunization History  Administered Date(s) Administered   Influenza Inj Mdck Quad Pf 02/05/2018   Influenza Whole 03/04/2008, 02/26/2010   Influenza,inj,Quad PF,6+ Mos 01/28/2014, 01/10/2017, 02/05/2019, 02/02/2020   Influenza,trivalent, recombinat, inj, PF 02/24/2015   Influenza-Unspecified 03/03/2013, 01/25/2016, 02/05/2018   PFIZER Comirnaty(Gray Top)Covid-19 Tri-Sucrose Vaccine 07/12/2020   PFIZER(Purple Top)SARS-COV-2 Vaccination 12/28/2019, 01/18/2020   Td 03/04/2008   Tdap 10/13/2018   Zoster Recombinat (Shingrix) 05/14/2018, 07/15/2018, 12/29/2018   Zoster, Live 12/26/2018   4. Cervical cancer screening- March 2018 with 3 rear repeat planned last noted- continues to see [facility]- has every 2 years typically. She does have a history of total hysterectomy for benign reasons- technically not required to have Pap Smears but we defer to GYN. She is going to see GYN soon. She is HPV Negative. 5. Breast cancer screening- History of right breast cancer and has finished time on tamoxifen. breast exam with GYN and mammogram 02/29/2020 with 1 year repeat planned with 3d. 6. Colon cancer screening - 11/25/2014 with 10-year repeat planned. 7.  Skin cancer screening- follows with Dr. Ledell Peoples office- now sees Dr. Veleta Miners. advised regular sunscreen use. Denies worrisome, changing, or new skin lesions. 8. Birth control/STD check- hysterectomy and monogamous 9. Osteoporosis screening at 18- had done with oncology in 2018 -Never smoker  Status of chronic or acute concerns   # B12 deficiency S: Current treatment/medication (oral vs. IM): B complex vitamin, Vitamin B-12 1000 MCG Lab Results  Component Value Date   VITAMINB12 273 10/14/2019  A/P: Hopefully controlled. Update B-12 today   #Vitamin D deficiency S: Medication: 5000 units of Vitamin D per day. Last vitamin D Lab Results  Component Value Date   VD25OH 32.65 10/14/2019  A/P:  Hopefully controlled. Update Vitamin D today.  # Anxiety/ Situational Depression due to COVID-19 pandemic See separate note  # Right Sinus Pain S: Symptoms shown in the past 7 days. Similar symptoms in the past year.  History of allergies but this has been above and beyond her baseline. A/P: Viral sinusitis-potential bacterial sinusitis if persist-consider course of Augmentin if symptoms persist for another 3-7 days.  # Lipoma S:Located on right mid back- it has stayed the same and is still there. Not comfortable with visiting the hospital to have it removed due to timing. She has concerns of having it removed will cause issues for her afterwards. A/P: Patient not ready to move forward with any evaluation/surgery at this time-she will let us know if she changes her mind  # GERD S:Medication: Pepcid helpful if needed. She says it has been doing well- participating in yoga- limits her maneuvers but overall well. A/P: Apparently avoiding downward facing dog has been very helpful-continue to monitor primarily without medications  #hyperlipidemia S: Medication: none  Lab Results  Component Value Date   CHOL 226 (H) 10/14/2019   HDL 53.90 10/14/2019   LDLCALC 151 (H) 10/14/2019   LDLDIRECT 110.1  04/26/2011   TRIG 104.0 10/14/2019   CHOLHDL 4 10/14/2019   A/P: ASCVD risk of 3.2%-not in range where any she needs medicine unless at significant worsening of lipids-update lipid panel with labs today.  She is initiating coronary calcium scoring if 10-year risk gets to 7.5%  #Trigeminal neuralgia and migraines- follows with Dr. Jaynee Eagles. Also has a vascular loop and has been some consideration of gamma knife procedure- has done  significantly bette and wants to continue to hold off. In 2020 she was doing better with acupuncture. Sparing Fioricet or hydrocodone through Dr. Jaynee Eagles in the past-the Fioricet was not helpful and she has stopped this  # aortic root aneurysm-has been stable-follows with Dr. Cyndia Bent.  Thought to be congenital-incidental finding on breast imaging-excellent blood pressure control-no medication changes needed at this time - she is concerned about the clotting symptom from COVID-19 due to her condition.  Patient is overall JEHUD-14 risk and complication score is only 1-I did not consider the aneurysm a significant risk factor for COVID-19 severe disease-discussing this with patient was very reassuring for her  #Post Nasal Drip- after 1st COVID-19 vaccination, she has been experiencing this.  Has not resolved  #Fatigue- She gets very tired after her 80 minute walks per week and does not understand why.  We discussed that is a rather long walk-discussed potentially shortening to 45 minutes or so and journaling how she is feeling  Recommended follow up: Return in about 1 year (around 10/18/2021) for physical or sooner if needed.  Lab/Order associations: Notfasting   ICD-10-CM   1. Preventative health care  Z00.00 B12    VITAMIN D 25 Hydroxy (Vit-D Deficiency, Fractures)    CBC with Differential/Platelet    Comprehensive metabolic panel    Lipid panel    2. Vitamin D deficiency  E55.9 VITAMIN D 25 Hydroxy (Vit-D Deficiency, Fractures)    3. Hyperlipidemia, unspecified  hyperlipidemia type  E78.5 CBC with Differential/Platelet    Comprehensive metabolic panel    Lipid panel    4. Trigeminal neuralgia  G50.0     5. Migraine with aura and without status migrainosus, not intractable  G43.109     6. Aortic root aneurysm (HCC)  I71.9     7. Vitamin B12 deficiency  E53.8 B12    8. Anxiety  F41.9       Meds ordered this encounter  Medications   escitalopram (LEXAPRO) 5 MG tablet    Sig: Take 1.5 tablets (7.5 mg total) by mouth daily.    Dispense:  135 tablet    Refill:  3    I,Harris Phan,acting as a scribe for Garret Reddish, MD.,have documented all relevant documentation on the behalf of Garret Reddish, MD,as directed by  Garret Reddish, MD while in the presence of Garret Reddish, MD.   I, Garret Reddish, MD, have reviewed all documentation for this visit. The documentation on 10/18/20 for the exam, diagnosis, procedures, and orders are all accurate and complete.   Return precautions advised.  Garret Reddish, MD

## 2020-10-18 NOTE — Progress Notes (Addendum)
Phone [phone] In person visit   Subjective:   [name] is a 60 y.o. year old very pleasant female patient who presents for/with See problem oriented charting  This visit occurred during the SARS-CoV-2 public health emergency.  Safety protocols were in place, including screening questions prior to the visit, additional usage of staff PPE, and extensive cleaning of exam room while observing appropriate contact time as indicated for disinfecting solutions.   Past Medical History-  Patient Active Problem List   Diagnosis Date Noted   Trigeminal neuralgia 09/30/2016    Priority: High   Migraine with aura and without status migrainosus 09/30/2016    Priority: High   Abnormal MRA, brain 05/15/2016    Priority: High   Aortic root aneurysm ([place]) 05/03/2016    Priority: High   History of right breast cancer 08/23/2014    Priority: High   Tinnitus 01/10/2010    Priority: High   Vitamin D deficiency 10/11/2017    Priority: Medium   Hyperlipidemia 11/28/2010    Priority: Medium   Anxiety 11/28/2010    Priority: Medium   HEARING LOSS, BILATERAL 01/10/2010    Priority: Medium   Bee sting-induced anaphylaxis 10/11/2017    Priority: Low   Sensorineural hearing loss (SNHL) of right ear 05/15/2016    Priority: Low   Dyspnea 12/14/2014    Priority: Low   Other allergic rhinitis 12/14/2014    Priority: Low   Genetic testing 12/08/2014    Priority: Low   Loss of transverse plantar arch 07/16/2014    Priority: Low   Hereditary and idiopathic peripheral neuropathy 01/10/2010    Priority: Low   Vitamin B12 deficiency 01/09/2010    Priority: Low   Fatigue 07/14/2008    Priority: Low   History of skin cancer 05/26/2007    Priority: Low   History of total hysterectomy 10/14/2019   Drug-induced anaphylaxis 07/09/2019   Allergy to hymenoptera venom 07/09/2019   History of urticaria 07/09/2019    Medications- reviewed and updated Current Outpatient Medications   Medication Sig Dispense Refill   b complex vitamins tablet Take 1 tablet by mouth daily.     Cholecalciferol (VITAMIN D) 2000 UNITS CAPS Take by mouth daily.     Coenzyme Q10 (COQ10 PO) Take 100 mg by mouth 2 (two) times daily.     cyanocobalamin (,VITAMIN B-12,) 1000 MCG/ML injection 1000 mcg injection once per month. 10 mL 3   cyclobenzaprine (FLEXERIL) 10 MG tablet TAKE 1 TABLET BY MOUTH 3 TIMES DAILY AS NEEDED FOR MUSCLE SPASMS 90 tablet 3   EPINEPHrine 0.3 mg/0.3 mL IJ SOAJ injection INJECT 1 PEN INTO THE MUSCLE FOR ONE DOSE FOR BEE STING ALLERGY AS DIRECTED 2 each 0   HYDROcodone-acetaminophen (NORCO/VICODIN) 5-325 MG tablet Take 1 tablet by mouth as needed for moderate pain.     magnesium oxide (MAG-OX) 400 MG tablet Take 400 mg by mouth daily.     Probiotic Product (PROBIOTIC DAILY PO) Take by mouth daily.     escitalopram (LEXAPRO) 5 MG tablet Take 1.5 tablets (7.5 mg total) by mouth daily. 135 tablet 3   No current facility-administered medications for this visit.     Objective:  BP 114/76   Pulse 69   Temp (!) 97.4 F (36.3 C) (Temporal)   Ht 5\' 7"  (1.702 m)   Wt 136 lb 12.8 oz (62.1 kg)   SpO2 98%   BMI 21.43 kg/m  Gen: NAD, resting comfortably     Assessment and Plan  Anxiety S: Medication: Lexapro 5 MG- stress is a concern to her.  Does not feel like her anxiety is well controlled.  No signs of depression with PHQ-9 of 0 Counseling: She is currently engaging in meditation. She did find hospice grief therapy very helpful and informative to her in the past- no longer visits but finds the experience long-lasting.  She is no longer seeing a therapist. -still has difficulty with COVID-19 and is stressed from it- will bring her to tears due to feeling very vunerable. She is exercising more often now to help cope with this. A/P: Poor control due to ongoing stress from COVID-19 pandemic.  Increased Lexapro 5 mg to 7.5 mg-see after visit summary instructions with plan t for  her to update me in 6 weeks  Recommended follow up: Return in about 1 year (around 10/18/2021) for physical or sooner if needed.  6-week update by MyChart   Lab/Order associations:   ICD-10-CM   1. Preventative health care  Z00.00 B12    VITAMIN D 25 Hydroxy (Vit-D Deficiency, Fractures)    CBC with Differential/Platelet    Comprehensive metabolic panel    Lipid panel    2. Vitamin D deficiency  E55.9 VITAMIN D 25 Hydroxy (Vit-D Deficiency, Fractures)    3. Hyperlipidemia, unspecified hyperlipidemia type  E78.5 CBC with Differential/Platelet    Comprehensive metabolic panel    Lipid panel    4. Trigeminal neuralgia  G50.0     5. Migraine with aura and without status migrainosus, not intractable  G43.109     6. Aortic root aneurysm (HCC)  I71.9     7. Vitamin B12 deficiency  E53.8 B12    8. Anxiety  F41.9       Meds ordered this encounter  Medications   escitalopram (LEXAPRO) 5 MG tablet    Sig: Take 1.5 tablets (7.5 mg total) by mouth daily.    Dispense:  135 tablet    Refill:  3   Return precautions advised.  Garret Reddish, MD

## 2020-10-18 NOTE — Patient Instructions (Addendum)
Health Maintenance Due  Topic Date Due   COVID-19 Vaccine (4 - Booster for [employer] series) will schedule when she is eligible to have this done (not yet due)  10/12/2020   Please stop by lab before you go If you have mychart- we will send your results within 3 business days of [place] receiving them.  If you do not have mychart- we will call you about results within 5 business days of [place] receiving them.  *please also note that you will see labs on mychart as soon as they post. I will later go in and write notes on them- will say "notes from Dr. Yong Channel"  Trial 7.5 mg of escitalopram. Give me an update in 6 weeks if this is helping or not. Could try 10 mg. If any thoughts of self harm let us know.   Recommended follow up: Return in about 1 year (around 10/18/2021) for physical or sooner if needed.

## 2020-10-18 NOTE — Assessment & Plan Note (Signed)
S: Medication: Lexapro 5 MG- stress is a concern to her.  Does not feel like her anxiety is well controlled.  No signs of depression with PHQ-9 of 0 Counseling: She is currently engaging in meditation. She did find hospice grief therapy very helpful and informative to her in the past- no longer visits but finds the experience long-lasting.  She is no longer seeing a therapist. -still has difficulty with COVID-19 and is stressed from it- will bring her to tears due to feeling very vunerable. She is exercising more often now to help cope with this. A/P: Poor control due to ongoing stress from COVID-19 pandemic.  Increased Lexapro 5 mg to 7.5 mg-see after visit summary instructions with plan t for her to update me in 6 weeks

## 2020-10-19 ENCOUNTER — Other Ambulatory Visit (HOSPITAL_COMMUNITY): Payer: Self-pay

## 2020-10-19 ENCOUNTER — Other Ambulatory Visit (HOSPITAL_BASED_OUTPATIENT_CLINIC_OR_DEPARTMENT_OTHER): Payer: Self-pay

## 2020-10-19 ENCOUNTER — Encounter: Payer: Self-pay | Admitting: Family Medicine

## 2020-10-19 ENCOUNTER — Other Ambulatory Visit: Payer: Self-pay

## 2020-10-19 MED ORDER — CYANOCOBALAMIN 1000 MCG/ML IJ SOLN
INTRAMUSCULAR | 3 refills | Status: DC
  Filled 2020-10-19 – 2020-10-20 (×2): qty 3, 84d supply, fill #0
  Filled 2021-01-19: qty 3, 84d supply, fill #1
  Filled 2021-10-15: qty 3, 84d supply, fill #2

## 2020-10-19 MED ORDER — "BD LUER-LOK SYRINGE 25G X 1"" 3 ML MISC"
3 refills | Status: DC
  Filled 2020-10-19 – 2021-01-19 (×2): qty 3, 84d supply, fill #0
  Filled 2021-10-15: qty 3, 84d supply, fill #1

## 2020-10-20 ENCOUNTER — Other Ambulatory Visit (HOSPITAL_COMMUNITY): Payer: Self-pay

## 2020-10-20 ENCOUNTER — Other Ambulatory Visit (HOSPITAL_BASED_OUTPATIENT_CLINIC_OR_DEPARTMENT_OTHER): Payer: Self-pay

## 2020-11-15 ENCOUNTER — Other Ambulatory Visit (HOSPITAL_COMMUNITY): Payer: Self-pay

## 2020-11-28 ENCOUNTER — Other Ambulatory Visit: Payer: Self-pay | Admitting: Family Medicine

## 2020-11-28 ENCOUNTER — Other Ambulatory Visit (HOSPITAL_BASED_OUTPATIENT_CLINIC_OR_DEPARTMENT_OTHER): Payer: Self-pay

## 2020-11-28 ENCOUNTER — Other Ambulatory Visit (HOSPITAL_COMMUNITY): Payer: Self-pay

## 2020-11-28 MED ORDER — EPINEPHRINE 0.3 MG/0.3ML IJ SOAJ
INTRAMUSCULAR | 0 refills | Status: DC
  Filled 2020-11-28: qty 2, 4d supply, fill #0

## 2020-11-30 ENCOUNTER — Other Ambulatory Visit (HOSPITAL_BASED_OUTPATIENT_CLINIC_OR_DEPARTMENT_OTHER): Payer: Self-pay

## 2020-12-29 ENCOUNTER — Telehealth: Payer: Self-pay

## 2020-12-29 NOTE — Telephone Encounter (Signed)
Patient sent a Mychart appointment request for the 3RD Covid Vaccine with the Omicron version in it. [name] spoke with Dr Ernst Bowler and he stated to place the patient on his schedule as she will need a room due to previous reactions to the vaccine.  Patient called and is now scheduled for 9/15  @ 10:30 in [place] with Dr Ernst Bowler & on the vaccine schedule. Patient states she is bringing her husband just in case anything happens.   Beth states she will keep the patient updated on getting the Omicron Version in for her appointment.

## 2020-12-29 NOTE — Telephone Encounter (Signed)
Great!  Thank you for taking care of her!  Salvatore Marvel, MD [facility] [place]

## 2021-01-05 NOTE — Telephone Encounter (Signed)
We canceled the patients appt due to our office not having the vaccine yet. [name] believes we will have it towards the end of September. I let the patient know about the update and will contact her once I get another update.

## 2021-01-12 ENCOUNTER — Ambulatory Visit: Payer: 59 | Admitting: Allergy & Immunology

## 2021-01-12 ENCOUNTER — Ambulatory Visit: Payer: 59

## 2021-01-20 ENCOUNTER — Other Ambulatory Visit (HOSPITAL_COMMUNITY): Payer: Self-pay

## 2021-01-26 ENCOUNTER — Ambulatory Visit: Payer: 59 | Admitting: Family Medicine

## 2021-01-26 ENCOUNTER — Other Ambulatory Visit: Payer: Self-pay

## 2021-01-26 ENCOUNTER — Encounter: Payer: Self-pay | Admitting: Family Medicine

## 2021-01-26 VITALS — BP 133/69 | HR 71 | Temp 98.0°F | Ht 67.0 in | Wt 134.4 lb

## 2021-01-26 DIAGNOSIS — R197 Diarrhea, unspecified: Secondary | ICD-10-CM | POA: Diagnosis not present

## 2021-01-26 LAB — CBC WITH DIFFERENTIAL/PLATELET
Basophils Absolute: 0.1 10*3/uL (ref 0.0–0.1)
Basophils Relative: 1 % (ref 0.0–3.0)
Eosinophils Absolute: 0.1 10*3/uL (ref 0.0–0.7)
Eosinophils Relative: 1.5 % (ref 0.0–5.0)
HCT: 43 % (ref 36.0–46.0)
Hemoglobin: 14.5 g/dL (ref 12.0–15.0)
Lymphocytes Relative: 23.7 % (ref 12.0–46.0)
Lymphs Abs: 1.6 10*3/uL (ref 0.7–4.0)
MCHC: 33.6 g/dL (ref 30.0–36.0)
MCV: 90.6 fl (ref 78.0–100.0)
Monocytes Absolute: 0.5 10*3/uL (ref 0.1–1.0)
Monocytes Relative: 7.9 % (ref 3.0–12.0)
Neutro Abs: 4.4 10*3/uL (ref 1.4–7.7)
Neutrophils Relative %: 65.9 % (ref 43.0–77.0)
Platelets: 381 10*3/uL (ref 150.0–400.0)
RBC: 4.75 Mil/uL (ref 3.87–5.11)
RDW: 12.5 % (ref 11.5–15.5)
WBC: 6.6 10*3/uL (ref 4.0–10.5)

## 2021-01-26 LAB — COMPREHENSIVE METABOLIC PANEL
ALT: 17 U/L (ref 0–35)
AST: 21 U/L (ref 0–37)
Albumin: 4.4 g/dL (ref 3.5–5.2)
Alkaline Phosphatase: 50 U/L (ref 39–117)
BUN: 13 mg/dL (ref 6–23)
CO2: 24 mEq/L (ref 19–32)
Calcium: 9.4 mg/dL (ref 8.4–10.5)
Chloride: 103 mEq/L (ref 96–112)
Creatinine, Ser: 0.73 mg/dL (ref 0.40–1.20)
GFR: 88.33 mL/min (ref >60.00)
Glucose, Bld: 93 mg/dL (ref 70–99)
Potassium: 4 mEq/L (ref 3.5–5.1)
Sodium: 138 mEq/L (ref 135–145)
Total Bilirubin: 0.6 mg/dL (ref 0.2–1.2)
Total Protein: 7.4 g/dL (ref 6.0–8.3)

## 2021-01-26 LAB — TSH: TSH: 1.88 u[IU]/mL (ref 0.35–5.50)

## 2021-01-26 LAB — MAGNESIUM: Magnesium: 2.1 mg/dL (ref 1.5–2.5)

## 2021-01-26 NOTE — Patient Instructions (Addendum)
Health Maintenance Due  Topic Date Due   COVID-19 Vaccine (4 - Booster for [employer] series)  Recommended getting Omicron specific booster only! Please let us know when you have received this vaccination.  10/04/2020   INFLUENZA VACCINE   Patient plans to wait until further notice and she improves.  11/28/2020    60 year old female with history of microscopic colitis several years ago presenting with watery diarrhea up to 6 to 12/day in last few days.  Some relief with Pepto-Bismol. - We will get CBC, CMP, TSH given duration of symptoms of 2 and half weeks - Get GI pathogen panel/stool studies-rule out C. difficile and other bacterial or viral causes - Can retrial Imodium perhaps midday between her typically morning and evening Pepto-Bismol. - If no obvious cause is found with the above work-up and symptoms persist consider referral back to [facility] who managed microscopic colitis previously - She is already made some nice adjustments on her diet-discussed brat type diet  Please stop by lab before you go If you have mychart- we will send your results within 3 business days of [place] receiving them.  If you do not have mychart- we will call you about results within 5 business days of [place] receiving them.  *please also note that you will see labs on mychart as soon as they post. I will later go in and write notes on them- will say "notes from Dr. Yong Channel"  Make sure to get stool collection materials.  I think it is fine to continue your probiotics and Pepto-Bismol. If you are still having issues after taking Pepto, you can try taking Imodium perhaps mid-day.  If your diarrhea does not improve or symptoms began to worsen, please update me and we will consider a referral to [facility].  Please avoid NSAIDs - especially Aleve. You can try Tylenol.  Recommended follow up: Return for as needed for new, worsening, persistent symptoms.

## 2021-01-26 NOTE — Progress Notes (Signed)
Phone [phone] In person visit   Subjective:   [name] is a 60 y.o. year old very pleasant female patient who presents for/with See problem oriented charting Chief Complaint  Patient presents with   Diarrhea    Going on for about 2 weeks. Patient states that its getting better but its still there. No fever , doesn't eat out, has been taking Pepto bismol.    This visit occurred during the SARS-CoV-2 public health emergency.  Safety protocols were in place, including screening questions prior to the visit, additional usage of staff PPE, and extensive cleaning of exam room while observing appropriate contact time as indicated for disinfecting solutions.   Past Medical History-  Patient Active Problem List   Diagnosis Date Noted   Trigeminal neuralgia 09/30/2016    Priority: 1.   Migraine with aura and without status migrainosus 09/30/2016    Priority: 1.   Abnormal MRA, brain 05/15/2016    Priority: 1.   Aortic root aneurysm ([place]) 05/03/2016    Priority: 1.   History of right breast cancer 08/23/2014    Priority: 1.   Tinnitus 01/10/2010    Priority: 1.   Vitamin D deficiency 10/11/2017    Priority: 2.   Hyperlipidemia 11/28/2010    Priority: 2.   Anxiety 11/28/2010    Priority: 2.   HEARING LOSS, BILATERAL 01/10/2010    Priority: 2.   Bee sting-induced anaphylaxis 10/11/2017    Priority: 3.   Sensorineural hearing loss (SNHL) of right ear 05/15/2016    Priority: 3.   Dyspnea 12/14/2014    Priority: 3.   Other allergic rhinitis 12/14/2014    Priority: 3.   Genetic testing 12/08/2014    Priority: 3.   Loss of transverse plantar arch 07/16/2014    Priority: 3.   Hereditary and idiopathic peripheral neuropathy 01/10/2010    Priority: 3.   Vitamin B12 deficiency 01/09/2010    Priority: 3.   Fatigue 07/14/2008    Priority: 3.   History of skin cancer 05/26/2007    Priority: 3.   History of total hysterectomy 10/14/2019   Drug-induced anaphylaxis  07/09/2019   Allergy to hymenoptera venom 07/09/2019   History of urticaria 07/09/2019    Medications- reviewed and updated Current Outpatient Medications  Medication Sig Dispense Refill   b complex vitamins tablet Take 1 tablet by mouth daily.     Cholecalciferol (VITAMIN D) 2000 UNITS CAPS Take by mouth daily.     Coenzyme Q10 (COQ10 PO) Take 100 mg by mouth 2 (two) times daily.     cyanocobalamin (,VITAMIN B-12,) 1000 MCG/ML injection Inject 1000 mcg once per month. 4 mL 3   cyclobenzaprine (FLEXERIL) 10 MG tablet TAKE 1 TABLET BY MOUTH 3 TIMES DAILY AS NEEDED FOR MUSCLE SPASMS 90 tablet 3   EPINEPHrine 0.3 mg/0.3 mL IJ SOAJ injection INJECT 1 PEN INTO THE MUSCLE FOR ONE DOSE FOR BEE STING ALLERGY AS DIRECTED 2 each 0   escitalopram (LEXAPRO) 5 MG tablet Take 1.5 tablets (7.5 mg total) by mouth daily. 135 tablet 3   HYDROcodone-acetaminophen (NORCO/VICODIN) 5-325 MG tablet Take 1 tablet by mouth as needed for moderate pain.     magnesium oxide (MAG-OX) 400 MG tablet Take 400 mg by mouth daily.     Probiotic Product (PROBIOTIC DAILY PO) Take by mouth daily.     SYRINGE-NEEDLE, DISP, 3 ML (B-D 3CC LUER-LOK SYR 25GX1") 25G X 1" 3 ML MISC Use with Vitamin B12 injection once a month 3  each 3   No current facility-administered medications for this visit.     Objective:  BP 133/69   Pulse 71   Temp 98 F (36.7 C) (Temporal)   Ht 5\' 7"  (1.702 m)   Wt 134 lb 6.4 oz (61 kg)   SpO2 99%   BMI 21.05 kg/m  Gen: NAD, resting comfortably, well appearing CV: RRR no murmurs rubs or gallops Lungs: CTAB no crackles, wheeze, rhonchi Abdomen: soft/nontender/nondistended/normal bowel sounds. No rebound or guarding.  Ext: no edema Skin: warm, dry    Assessment and Plan   # Diarrhea S: Started:  about 2.5 weeks ago. Patient takes a probiotic at baseline  The symptoms are gradually improving with decreasing fiber Stools per day: 6 yesterday, day prior 12 Reports started as watery diarrhea.  Doesn't eat out at present.  No fever. Wondered if this could be something she ate- Husband with no issues. History of microscopic colitis years ago by Dr. Cristina Gong- pepto bismol helpful n past and slightly helpful this time. Very high fiber baseline diet. Has felt fatigued/run down. Has also tried colostrum on top of baseline probiotic.  The patient currently denies significant abdominal pain or discomfort.  Relation to food or medication: at first had BM right after but has done better more recently plus has reduced fiber and that is helping- potato soup helpful last night. No recent antibiotics- several years. Some urgency but not bad.  Therapy tried so far: OTC antidiarrheal: imodium at first, results inadequate- mild relief, dietary changes: reducing fiber, results fair.  ROS- No unusual headaches- mild at first, no dizziness- mild after BM.  No black or bloody stools (outside of pepto bismol) .   No mucus in stools.   A/P: 60 year old female with history of microscopic colitis several years ago presenting with watery diarrhea up to 6 to 12/day in last few days.  Some relief with Pepto-Bismol. - We will get CBC, CMP, TSH given duration of symptoms of 2 and half weeks - Get GI pathogen panel/stool studies-rule out C. difficile and other bacterial or viral causes - Can retrial Imodium perhaps midday between her typically morning and evening Pepto-Bismol. - If no obvious cause is found with the above work-up and symptoms persist consider referral back to [facility] who managed microscopic colitis previously - She is already made some nice adjustments on her diet-discussed brat type diet  Lab Results  Component Value Date   TSH 1.46 10/13/2018  - history of HLD- can also check TSH under this   Recommended follow up: Return for as needed for new, worsening, persistent symptoms. Future Appointments  Date Time Provider [place]  10/23/2021  8:00 AM Marin Olp, MD [facility]     Lab/Order associations:   ICD-10-CM   1. Diarrhea, unspecified type  R19.7 CBC with Differential/Platelet    Comprehensive metabolic panel    Magnesium    GI Profile, Stool, PCR    TSH      Time Spent: 22 minutes of total time (8:20 AM- 8:42AM) was spent on the date of the encounter performing the following actions: chart review prior to seeing the patient, obtaining history, performing a medically necessary exam, counseling on the treatment plan, placing orders, and documenting in our EHR.    I,Harris Phan,acting as a Education administrator for Garret Reddish, MD.,have documented all relevant documentation on the behalf of Garret Reddish, MD,as directed by  Garret Reddish, MD while in the presence of Garret Reddish, MD.  I, Garret Reddish,  MD, have reviewed all documentation for this visit. The documentation on 01/26/21 for the exam, diagnosis, procedures, and orders are all accurate and complete.   Return precautions advised.  Garret Reddish, MD

## 2021-01-30 ENCOUNTER — Encounter: Payer: Self-pay | Admitting: Family Medicine

## 2021-01-30 ENCOUNTER — Other Ambulatory Visit (INDEPENDENT_AMBULATORY_CARE_PROVIDER_SITE_OTHER): Payer: 59

## 2021-01-30 ENCOUNTER — Other Ambulatory Visit: Payer: Self-pay

## 2021-01-30 DIAGNOSIS — R197 Diarrhea, unspecified: Secondary | ICD-10-CM

## 2021-01-30 LAB — FECAL OCCULT BLOOD, IMMUNOCHEMICAL: Fecal Occult Bld: NEGATIVE

## 2021-02-06 NOTE — Telephone Encounter (Signed)
I called & left a voicemail for the patient to schedule for 02/13/21 on our next [facility]. Patient can schedule for [facility]. Patient also needs to be placed on the schedule to see Dr Ernst Bowler @ the same time to monitor her for reactions as she has had past reactions.

## 2021-02-07 ENCOUNTER — Encounter: Payer: Self-pay | Admitting: Family Medicine

## 2021-02-09 ENCOUNTER — Other Ambulatory Visit: Payer: 59

## 2021-02-09 DIAGNOSIS — R197 Diarrhea, unspecified: Secondary | ICD-10-CM | POA: Diagnosis not present

## 2021-02-13 LAB — GI PROFILE, STOOL, PCR

## 2021-02-16 ENCOUNTER — Other Ambulatory Visit: Payer: Self-pay | Admitting: Obstetrics and Gynecology

## 2021-02-16 DIAGNOSIS — Z1231 Encounter for screening mammogram for malignant neoplasm of breast: Secondary | ICD-10-CM

## 2021-02-21 ENCOUNTER — Encounter: Payer: Self-pay | Admitting: Family Medicine

## 2021-02-23 ENCOUNTER — Other Ambulatory Visit: Payer: Self-pay

## 2021-02-23 DIAGNOSIS — R197 Diarrhea, unspecified: Secondary | ICD-10-CM

## 2021-03-10 NOTE — Telephone Encounter (Signed)
Pt called needing a referral sent back to [facility]. She stated that Dr Lorie Apley office agreed to take her on but then called her and told her that he cannot accept her. She would like a referral placed back [facility] if possible. Please Advise.

## 2021-03-13 ENCOUNTER — Other Ambulatory Visit: Payer: Self-pay

## 2021-03-13 DIAGNOSIS — R197 Diarrhea, unspecified: Secondary | ICD-10-CM

## 2021-03-16 ENCOUNTER — Other Ambulatory Visit: Payer: Self-pay

## 2021-03-16 ENCOUNTER — Ambulatory Visit
Admission: RE | Admit: 2021-03-16 | Discharge: 2021-03-16 | Disposition: A | Discharge status: Home / Self Care | Payer: 59 | Source: Ambulatory Visit | Attending: Obstetrics and Gynecology | Admitting: Obstetrics and Gynecology

## 2021-03-16 DIAGNOSIS — Z1231 Encounter for screening mammogram for malignant neoplasm of breast: Secondary | ICD-10-CM | POA: Diagnosis not present

## 2021-03-17 ENCOUNTER — Other Ambulatory Visit: Payer: Self-pay

## 2021-03-17 DIAGNOSIS — R197 Diarrhea, unspecified: Secondary | ICD-10-CM

## 2021-03-17 NOTE — Telephone Encounter (Signed)
Pt called stating that the referral was sent [facility] but cannot see her until the end of January. Pt wants to be referred to [facility]. She stated that she is still having runny diarrhea and it is going on for several weeks. Can referral be switched?

## 2021-03-20 ENCOUNTER — Encounter: Payer: Self-pay | Admitting: Gastroenterology

## 2021-03-30 ENCOUNTER — Ambulatory Visit: Payer: 59 | Admitting: Gastroenterology

## 2021-03-30 ENCOUNTER — Other Ambulatory Visit (HOSPITAL_BASED_OUTPATIENT_CLINIC_OR_DEPARTMENT_OTHER): Payer: Self-pay

## 2021-03-30 ENCOUNTER — Encounter: Payer: Self-pay | Admitting: Gastroenterology

## 2021-03-30 VITALS — BP 110/62 | HR 78 | Ht 67.0 in | Wt 131.8 lb

## 2021-03-30 DIAGNOSIS — K529 Noninfective gastroenteritis and colitis, unspecified: Secondary | ICD-10-CM

## 2021-03-30 DIAGNOSIS — K52832 Lymphocytic colitis: Secondary | ICD-10-CM

## 2021-03-30 MED ORDER — BUDESONIDE 3 MG PO CPEP
ORAL_CAPSULE | ORAL | 0 refills | Status: AC
  Filled 2021-03-30: qty 126, 56d supply, fill #0

## 2021-03-30 NOTE — Patient Instructions (Addendum)
If you are age 62 or older, your body mass index should be between 23-30. Your Body mass index is 20.64 kg/m. If this is out of the aforementioned range listed, please consider follow up with your Primary Care Provider.  If you are age 31 or younger, your body mass index should be between 19-25. Your Body mass index is 20.64 kg/m. If this is out of the aformentioned range listed, please consider follow up with your Primary Care Provider.   ________________________________________________________  The [facility] providers would like to encourage you to use [facility] to communicate with providers for non-urgent requests or questions.  Due to long hold times on the telephone, sending your provider a message by [facility] may be a faster and more efficient way to get a response.  Please allow 48 business hours for a response.  Please remember that this is for non-urgent requests.  _______________________________________________________  We have sent the following medications to your pharmacy for you to pick up at your convenience: Budesonide 3mg : Take 9mg   daily for 4 weeks, then 6 mg daily for 2 weeks and then 3 mg daily for 2 weeks.  Use Imodium as needed  Thank you for entrusting me with your care and for choosing [facility], Dr. El Rito Cellar

## 2021-03-30 NOTE — Progress Notes (Signed)
HPI :  60 year old female with a history of breast cancer, history of lymphocytic colitis, history of tinnitus, referred here by Dr. Garret Reddish for further evaluation of altered bowel habits, diarrhea.  The patient reports she has historically had normal bowel habits until 2016 she had acute onset of diarrhea which persisted and was severe.  She had a negative his infectious work-up at that time, tested negative for celiac disease per her report.  She underwent a colonoscopy in July 2016 with Dr. Cristina Gong of [facility], she was found to have lymphocytic colitis.  She declined trial of steroids at that time and was given Pepto-Bismol and probiotics and states after few months of therapy her symptoms eventually went away.  She was able to come off Pepto-Bismol and was feeling well for several years.  She states at baseline she has 1 formed bowel movement per day.  Around Labor Day of this year she states she had acute change in her bowel habits and developed loose watery stools, going 10-12 bowel movements per day.  She states she felt dehydrated and weak with this.  She denied any abdominal pains or blood in her stools.  Noticed some increased gas.  She states this is similar to how she felt with her lymphocytic colitis in the past.  She had some nocturnal symptoms at that time, but no fecal incontinence.  She states for the month of September she had frequent stools to that extent, in October this reduced to about 6 bowel movements per day and last month was down to 3-4 bowel movements per day but she is not yet back to normal and still having loose watery stools that bother her.  She states this is been draining for her.  She has tried taking Pepto-Bismol again but that makes her tinnitus worse so she stays off it.  She has not tried more than 1 tab of Imodium a day.  She has been trying to add fiber to her diet which she thinks may have helped.  She started taking a curcumin supplement upwards of 2 g/day  over the past month after she read about this and colitis online, she is also taking [place] supplementation and a probiotic.  She is not taking any NSAIDs.  She is lost about 9 pounds since this whole ordeal started.  She has no family history of colon cancer, celiac disease, or IBD.  Her sister has colon polyps.  She denies any change in medications since the start of this episode or any supplement use.  She takes rare Flexeril and Vicodin for trigeminal neuralgia.  No NSAIDs.  Her gallbladder remains in place.  Her breast cancer is in remission.  Labs in September showed a normal TSH, normal CBC, normal c-Met, normal FOBT.  GI pathogen panel negative in October.  Prior workup: Colonoscopy 11/25/2014 - normal ileum, cecal AVMs, biopsies taken - path c/w lymphocytic colitis  GI pathogen panel negative 02/09/21    Past Medical History:  Diagnosis Date   Adhesive capsulitis of shoulder    Anxiety    Aortic aneurysm ([place])    Breast cancer ([place])    Cancer ([place]) 2008   Breast   Congenital anomaly of aortic arch    Labyrinthitis, unspecified    Malignant neoplasm of breast (female), unspecified site    Other B-complex deficiencies    Other malaise and fatigue    Personal history of chemotherapy    Personal history of radiation therapy 2008   Unspecified hearing  loss    Unspecified hereditary and idiopathic peripheral neuropathy    Unspecified tinnitus      Past Surgical History:  Procedure Laterality Date   ABDOMINAL HYSTERECTOMY  04/2010   robotic   BASAL CELL CARCINOMA EXCISION  2003   rt eye area   [place]   Left   BREAST LUMPECTOMY Right Oct.29 & Mar 19, 2007   Right   IR GENERIC HISTORICAL  04/16/2016   IR RADIOLOGIST EVAL & MGMT 04/16/2016 [facility]-INTERV RAD   LAPAROSCOPY  1982   for endometriosis   wisdom teeth extracted     Family History  Problem Relation Age of Onset   Seizures Mother    Migraines Mother    Allergic rhinitis Mother     Diabetes Father    Hypertension Father    Other Father    Hyperlipidemia Father    Colon polyps Sister    Breast cancer Sister    Cancer Sister        breast   Breast cancer Maternal Grandmother    Cancer Other        breast   Social History   Tobacco Use   Smoking status: Never   Smokeless tobacco: Never  Vaping Use   Vaping Use: Never used  Substance Use Topics   Alcohol use: Yes    Comment: wine with dinner a few nights a week   Drug use: No   Current Outpatient Medications  Medication Sig Dispense Refill   b complex vitamins tablet Take 1 tablet by mouth daily.     Cholecalciferol (VITAMIN D) 2000 UNITS CAPS Take by mouth daily.     Coenzyme Q10 (COQ10 PO) Take 100 mg by mouth 2 (two) times daily.     cyanocobalamin (,VITAMIN B-12,) 1000 MCG/ML injection Inject 1000 mcg once per month. 4 mL 3   cyclobenzaprine (FLEXERIL) 10 MG tablet TAKE 1 TABLET BY MOUTH 3 TIMES DAILY AS NEEDED FOR MUSCLE SPASMS (Patient taking differently: Take by mouth 3 (three) times daily as needed for muscle spasms. prn) 90 tablet 3   EPINEPHrine 0.3 mg/0.3 mL IJ SOAJ injection INJECT 1 PEN INTO THE MUSCLE FOR ONE DOSE FOR BEE STING ALLERGY AS DIRECTED 2 each 0   escitalopram (LEXAPRO) 5 MG tablet Take 1.5 tablets (7.5 mg total) by mouth daily. 135 tablet 3   HYDROcodone-acetaminophen (NORCO/VICODIN) 5-325 MG tablet Take 1 tablet by mouth as needed for moderate pain (prn).     Probiotic Product (PROBIOTIC DAILY PO) Take by mouth daily.     SYRINGE-NEEDLE, DISP, 3 ML (B-D 3CC LUER-LOK SYR 25GX1") 25G X 1" 3 ML MISC Use with Vitamin B12 injection once a month 3 each 3   Turmeric 500 MG TABS Take 500 mg by mouth daily.     No current facility-administered medications for this visit.   Allergies  Allergen Reactions   Bee Venom Anaphylaxis   Other     kerajinan   Polysorbate Anaphylaxis    "Polysorbate 80 preservative"   Latex Hives   Lyrica [Pregabalin] Nausea And Vomiting and Other (See  Comments)    Dizziness    Miralax [Polyethylene Glycol] Hives    pruritus   Doxycycline Hyclate Rash     Review of Systems: All systems reviewed and negative except where noted in HPI.   Lab Results  Component Value Date   WBC 6.6 01/26/2021   HGB 14.5 01/26/2021   HCT 43.0 01/26/2021   MCV 90.6 01/26/2021  PLT 381.0 01/26/2021    Lab Results  Component Value Date   CREATININE 0.73 01/26/2021   BUN 13 01/26/2021   NA 138 01/26/2021   K 4.0 01/26/2021   CL 103 01/26/2021   CO2 24 01/26/2021    Lab Results  Component Value Date   ALT 17 01/26/2021   AST 21 01/26/2021   ALKPHOS 50 01/26/2021   BILITOT 0.6 01/26/2021    Physical Exam: BP 110/62   Pulse 78   Ht 5' 7" (1.702 m)   Wt 131 lb 12.8 oz (59.8 kg)   SpO2 98%   BMI 20.64 kg/m  Constitutional: Pleasant,well-developed, female in no acute distress. HEENT: Normocephalic and atraumatic. Conjunctivae are normal. No scleral icterus. Neck supple.  Cardiovascular: Normal rate, regular rhythm.  Pulmonary/chest: Effort normal and breath sounds normal. No wheezing, rales or rhonchi. Abdominal: Soft, nondistended, nontender. There are no masses palpable. . Extremities: no edema Lymphadenopathy: No cervical adenopathy noted. Neurological: Alert and oriented to person place and time. Skin: Skin is warm and dry. No rashes noted. Psychiatric: Normal mood and affect. Behavior is normal.   ASSESSMENT AND PLAN: 60 year old female here for new patient assessment of the following;  Chronic diarrhea History of lymphocytic colitis  History as outlined above, marked change in bowel habits in 2016 with a negative infectious work-up and had a colonoscopy consistent with lymphocytic colitis at that time.  Sounds like she declined budesonide at that time and was treated with Pepto-Bismol which eventually helped but took several weeks.  There were no clear culprits to have cause that at the time, she had done well for a few  years without any symptoms that bothered her off therapy and then recurrence this past Labor Day.  She tested negative again for infectious etiology.  September and October rather severe symptoms with weight loss but seems to be slowly improving although not nearly back to normal.  We discussed differential diagnosis.  I suspect she more than likely has had recurrence of lymphocytic colitis based on her symptoms, negative infectious work-up is reassuring.  Her labs are otherwise reassuring.  I think okay to hold off on colonoscopy at this time and empirically treat her for microscopic colitis.  We discussed options to include trial of budesonide, Imodium, Colestid or cholestyramine for empiric treatment of suspected lymphocytic colitis.  I would not use bismuth in light of her tinnitus.  After discussion of options she wants to try budesonide, will give her 9 mg daily for 30 days, then 6 mg/day for 2 weeks, then 3 mg a day for 2 weeks and then stop.  If she does not have improvement on this regimen she will contact me in a few weeks.  She can also add Imodium as needed.  She is avoiding NSAIDs and should continue to do so.  She can continue curcumin if she thinks that has helped her although I am unaware of any evidence of its use specifically for lymphocytic colitis, will need to look that up.  I counseled her I do not think it will hurt her.  I think it is okay for her to proceed with getting a flu shot and COVID booster, she inquired if it was okay to do that in the setting of lymphocytic colitis.    Plan: - start budesonide 22m / day for 30 days then 64m/ day for 2 weeks and then 2m30m day for 2 weeks and then done. Call in 2 weeks if no better -  add Immodium PRN  - can continue curcumin - avoid NSAIDs - okay to get flu shot / covid vaccine  Jolly Mango, MD [facility]  CC: Marin Olp, MD

## 2021-03-31 ENCOUNTER — Other Ambulatory Visit (HOSPITAL_BASED_OUTPATIENT_CLINIC_OR_DEPARTMENT_OTHER): Payer: Self-pay

## 2021-03-31 MED ORDER — FLUARIX QUADRIVALENT 0.5 ML IM SUSY
PREFILLED_SYRINGE | INTRAMUSCULAR | 0 refills | Status: DC
Start: 2021-03-31 — End: 2021-10-23
  Filled 2021-03-31: qty 0.5, 1d supply, fill #0

## 2021-04-10 ENCOUNTER — Other Ambulatory Visit (HOSPITAL_BASED_OUTPATIENT_CLINIC_OR_DEPARTMENT_OTHER): Payer: Self-pay

## 2021-04-14 ENCOUNTER — Other Ambulatory Visit (HOSPITAL_BASED_OUTPATIENT_CLINIC_OR_DEPARTMENT_OTHER): Payer: Self-pay

## 2021-04-14 ENCOUNTER — Ambulatory Visit: Payer: 59 | Attending: Internal Medicine

## 2021-04-14 DIAGNOSIS — Z23 Encounter for immunization: Secondary | ICD-10-CM

## 2021-04-14 MED ORDER — PFIZER COVID-19 VAC BIVALENT 30 MCG/0.3ML IM SUSP
INTRAMUSCULAR | 0 refills | Status: DC
  Filled 2021-04-14: qty 0.3, 1d supply, fill #0

## 2021-04-14 NOTE — Progress Notes (Signed)
° °  Covid-19 Vaccination Clinic  Name:  [name]    MRN: [id number] DOB: 01-27-1961  04/14/2021  [name] was observed post Covid-19 immunization for 30 minutes based on pre-vaccination screening without incident. She was provided with Vaccine Information Sheet and instruction to access the V-Safe system.   [name] was instructed to call 911 with any severe reactions post vaccine: Difficulty breathing  Swelling of face and throat  A fast heartbeat  A bad rash all over body  Dizziness and weakness   Immunizations Administered     Name Date Dose VIS Date Route   Pfizer Covid-19 Vaccine Bivalent Booster 04/14/2021  9:32 AM 0.3 mL 12/28/2020 Intramuscular   Manufacturer: [place]   Lot: [id number]   [place]: [phone]

## 2021-04-20 ENCOUNTER — Encounter: Payer: Self-pay | Admitting: Gastroenterology

## 2021-05-03 ENCOUNTER — Other Ambulatory Visit: Payer: Self-pay | Admitting: *Deleted

## 2021-05-03 DIAGNOSIS — I7121 Aneurysm of the ascending aorta, without rupture: Secondary | ICD-10-CM

## 2021-05-11 ENCOUNTER — Ambulatory Visit: Payer: 59 | Admitting: Gastroenterology

## 2021-05-30 ENCOUNTER — Other Ambulatory Visit: Payer: Self-pay

## 2021-05-30 ENCOUNTER — Ambulatory Visit
Admission: RE | Admit: 2021-05-30 | Discharge: 2021-05-30 | Disposition: A | Discharge status: Home / Self Care | Payer: 59 | Source: Ambulatory Visit | Attending: Surgery | Admitting: Surgery

## 2021-05-30 DIAGNOSIS — I7121 Aneurysm of the ascending aorta, without rupture: Secondary | ICD-10-CM

## 2021-05-30 DIAGNOSIS — I712 Thoracic aortic aneurysm, without rupture, unspecified: Secondary | ICD-10-CM | POA: Diagnosis not present

## 2021-05-30 MED ORDER — GADOBENATE DIMEGLUMINE 529 MG/ML IV SOLN
13.0000 mL | Freq: Once | INTRAVENOUS | Status: AC | PRN
  Administered 2021-05-30: 13 mL via INTRAVENOUS

## 2021-05-31 ENCOUNTER — Ambulatory Visit: Payer: 59 | Admitting: Physician Assistant

## 2021-05-31 VITALS — BP 135/70 | HR 98 | Resp 20 | Ht 67.0 in | Wt 134.0 lb

## 2021-05-31 DIAGNOSIS — I7121 Aneurysm of the ascending aorta, without rupture: Secondary | ICD-10-CM | POA: Diagnosis not present

## 2021-05-31 NOTE — Progress Notes (Signed)
[address]       [place] [address]             [phone]        JENTRY MCQUEARY [id number] 07-25-1960  History of Present Illness:  [name] is a 61 yo woman with a 4.0 cm fusiform ascending aortic aneurysm that was discovered in July 2006 on a breast MRI. She was reportedly referred to [facility] in the past for a medical genetics evaluation to rule out hereditary aneurysm disease and was ruled out for Marfans and Ehlers-Danlos. She reportedly had a second degree relative with Ehlers-Danlos.  She has been followed since 2016 by Dr. Cyndia Bent with most recent visit being in 06/2019.  At that time her aneurysm remained stable.  The presents today for 2 year follow up.  She is upset she is not seeing Dr. Cyndia Bent.  She has multiple questions about her condition.  First she states she was told that she has heard and read studies in regards to patients with COVID not doing well with aortic dissections.  Her next question is about getting a Calcium CT scan to assess her CAD.  She states her lipid panel has been elevated and her primary wants to put her on a statin.  She is concerned about the side effects with statin usage.  Finally she asks if Minoxodil tablets can be prescribed for hair loss.  She denies chest pain, shortness of breath.  She remains active and tries to maintain a healthy diet and lifestyle.  She has never smoked.   Current Outpatient Medications on File Prior to Visit  Medication Sig Dispense Refill   b complex vitamins tablet Take 1 tablet by mouth daily.     Cholecalciferol (VITAMIN D) 2000 UNITS CAPS Take by mouth daily.     Coenzyme Q10 (COQ10 PO) Take 100 mg by mouth 2 (two) times daily.     COVID-19 mRNA bivalent vaccine, Pfizer, (PFIZER COVID-19 VAC BIVALENT) injection Inject into the muscle. 0.3 mL 0   cyanocobalamin (,VITAMIN B-12,) 1000 MCG/ML injection Inject 1000 mcg once per month. 4 mL 3   EPINEPHrine 0.3 mg/0.3 mL IJ SOAJ injection INJECT  1 PEN INTO THE MUSCLE FOR ONE DOSE FOR BEE STING ALLERGY AS DIRECTED 2 each 0   escitalopram (LEXAPRO) 5 MG tablet Take 1.5 tablets (7.5 mg total) by mouth daily. 135 tablet 3   HYDROcodone-acetaminophen (NORCO/VICODIN) 5-325 MG tablet Take 1 tablet by mouth as needed for moderate pain (prn).     influenza vac split quadrivalent PF (FLUARIX QUADRIVALENT) 0.5 ML injection Inject into the muscle. 0.5 mL 0   Probiotic Product (PROBIOTIC DAILY PO) Take by mouth daily.     SYRINGE-NEEDLE, DISP, 3 ML (B-D 3CC LUER-LOK SYR 25GX1") 25G X 1" 3 ML MISC Use with Vitamin B12 injection once a month 3 each 3   Turmeric 500 MG TABS Take 500 mg by mouth daily.     No current facility-administered medications on file prior to visit.    BP 135/70 (BP Location: Left Arm, Patient Position: Sitting)    Pulse 98    Resp 20    Ht 5\' 7"  (1.702 m)    Wt 134 lb (60.8 kg)    SpO2 98% Comment: RA   BMI 20.99 kg/m   Physical Exam  Gen: NAD Heart: RRR Lungs: CTA Ext: no edema Neuro: grossly intact  CTA Results:  CLINICAL DATA:  Aortic aneurysm, follow-up. History of breast  carcinoma.   EXAM: MRA CHEST WITH OR WITHOUT CONTRAST   TECHNIQUE: Angiographic images of the chest were obtained using MRA technique without and with intravenous contrast.   CONTRAST:  51mL MULTIHANCE GADOBENATE DIMEGLUMINE 529 MG/ML IV SOLN   COMPARISON:  06/03/2019 and previous   FINDINGS: Cardiovascular: Heart size normal. No pericardial effusion. Central pulmonary arteries unremarkable. No evidence of aortic dissection. Classic 3 vessel brachiocephalic arterial origin anatomy without proximal stenosis. No significant atheromatous irregularity.   Aortic Root:   --Valve: 2.4 cm   --Sinuses: 3.3 cm   --Sinotubular Junction: 2.9 cm   Limitations by motion: Moderate   Thoracic Aorta:   --Ascending Aorta: 4.1 cm (previously 4 cm)   --Aortic Arch: 2.8 cm   --Descending Aorta: 2.9 cm   Mediastinum/Nodes: No mass or  adenopathy.   Lungs/Pleura: No pulmonary mass or pleural effusion.   Upper Abdomen: No acute findings.   Musculoskeletal: No chest wall abnormality. No acute or significant osseous findings.   Spinal cord: Negative limited evaluation   IMPRESSION: 1. Borderline 4.1 cm ascending thoracic aortic aneurysm, stable. Recommend annual imaging followup by CTA or MRA. This recommendation follows 2010 ACCF/AHA/AATS/ACR/ASA/SCA/SCAI/SIR/STS/SVM Guidelines for the Diagnosis and Management of Patients with Thoracic Aortic Disease. Circulation. 2010; 121: Y637-C588. Aortic aneurysm NOS (ICD10-I71.9)     Electronically Signed   By: Lucrezia Europe M.D.   On: 05/30/2021 11:58    A/P:  In regards to concern of COVID-19 infection and aortic dissection, I am unfamiliar with this association.  I explained to the patient that her biggest risk factor for dissection would be untreated HTN.  She has well controlled blood pressure and was told that should this change she would require anti-hypertensive medications  In regards to Minoxodil for hair growth.  This medication does have uses for HTN.  However there are several side effects associated with this medication including but not limited to dizziness, heart failure, and renal issues  This is not a medication I usually prescribe and she was encouraged to speak with her Dermatologist in regards to this.  In regards to statin use, after review of patient's lipid panel she does have HLD.  I explained to patient that despite her healthy lifestyle choices, she could have a familial deficiency where she doesn't process cholesterol appropriately.  I explained the benefit of statin use with decreasing risks with aortic aneurysm.  She however was told by a client of hers that is a Cardiology PA that she should have a Calcium score CT scan.  This would def show if she has underlying risk of CAD.  I have sent a message to her primary care physician who can assist her in  obtaining study and determining medical necessity.  Her Ascending Aortic Aneurysm remains stable at 4 cm.  I personally reviewed MRA and measured in same locations as 2021 and overall the dimensions remains around 38-40 mm. This remains unchanged since 2006.  Her blood pressure remains well controlled today.  We will see the patient back in 2 years with repeat MRA chest.   Risk Modification:  Statin:  No, patient with HLD, hesitant to start statin due to side effects  Smoking cessation instruction/counseling given:  Never smoker  Patient was counseled on importance of Blood Pressure Control.  Despite Medical intervention if the patient notices persistently elevated blood pressure readings.  They are instructed to contact their Primary Care Physician  Please avoid use of Fluoroquinolones as this can potentially increase your risk of Aortic Rupture  and/or Dissection  Patient educated on signs and symptoms of Aortic Dissection, handout also provided in AVS  Cloria Ciresi, PA-C 05/31/21

## 2021-06-03 ENCOUNTER — Other Ambulatory Visit: Payer: 59

## 2021-06-09 ENCOUNTER — Other Ambulatory Visit (HOSPITAL_BASED_OUTPATIENT_CLINIC_OR_DEPARTMENT_OTHER): Payer: Self-pay

## 2021-06-12 ENCOUNTER — Other Ambulatory Visit (HOSPITAL_BASED_OUTPATIENT_CLINIC_OR_DEPARTMENT_OTHER): Payer: Self-pay

## 2021-06-12 ENCOUNTER — Other Ambulatory Visit: Payer: Self-pay | Admitting: Family Medicine

## 2021-06-12 NOTE — Telephone Encounter (Signed)
This has never been filled by you. Last filled 2021 by Dr. Jaynee Eagles, should this be routed to him?

## 2021-06-13 ENCOUNTER — Other Ambulatory Visit: Payer: Self-pay | Admitting: Neurology

## 2021-06-13 ENCOUNTER — Encounter: Payer: Self-pay | Admitting: Family Medicine

## 2021-06-13 ENCOUNTER — Other Ambulatory Visit (HOSPITAL_COMMUNITY): Payer: Self-pay

## 2021-06-13 MED ORDER — HYDROCODONE-ACETAMINOPHEN 5-325 MG PO TABS
1.0000 | ORAL_TABLET | ORAL | 0 refills | Status: DC | PRN
  Filled 2021-06-13: qty 28, 5d supply, fill #0

## 2021-06-26 DIAGNOSIS — L814 Other melanin hyperpigmentation: Secondary | ICD-10-CM | POA: Diagnosis not present

## 2021-06-26 DIAGNOSIS — L570 Actinic keratosis: Secondary | ICD-10-CM | POA: Diagnosis not present

## 2021-06-26 DIAGNOSIS — Z85828 Personal history of other malignant neoplasm of skin: Secondary | ICD-10-CM | POA: Diagnosis not present

## 2021-06-26 DIAGNOSIS — L821 Other seborrheic keratosis: Secondary | ICD-10-CM | POA: Diagnosis not present

## 2021-06-26 DIAGNOSIS — Z08 Encounter for follow-up examination after completed treatment for malignant neoplasm: Secondary | ICD-10-CM | POA: Diagnosis not present

## 2021-06-26 DIAGNOSIS — D225 Melanocytic nevi of trunk: Secondary | ICD-10-CM | POA: Diagnosis not present

## 2021-06-26 DIAGNOSIS — L812 Freckles: Secondary | ICD-10-CM | POA: Diagnosis not present

## 2021-07-14 DIAGNOSIS — H524 Presbyopia: Secondary | ICD-10-CM | POA: Diagnosis not present

## 2021-09-28 ENCOUNTER — Telehealth: Payer: Self-pay | Admitting: Neurology

## 2021-09-28 ENCOUNTER — Other Ambulatory Visit: Payer: Self-pay | Admitting: Neurology

## 2021-09-28 ENCOUNTER — Other Ambulatory Visit (HOSPITAL_BASED_OUTPATIENT_CLINIC_OR_DEPARTMENT_OTHER): Payer: Self-pay

## 2021-09-28 MED ORDER — HYDROCODONE-ACETAMINOPHEN 5-325 MG PO TABS
1.0000 | ORAL_TABLET | ORAL | 0 refills | Status: DC | PRN
Start: 2021-09-28 — End: 2021-11-25
  Filled 2021-09-28: qty 28, 5d supply, fill #0

## 2021-09-28 NOTE — Telephone Encounter (Signed)
Called pt and got her scheduled for 06/27 at 8:30 am. She requested to have visit done through [place], is this okay?

## 2021-09-28 NOTE — Telephone Encounter (Signed)
Jillian, can you schedule a follow up for patient with me please? For trigeminal neuralgia. thanks

## 2021-10-16 ENCOUNTER — Other Ambulatory Visit (HOSPITAL_BASED_OUTPATIENT_CLINIC_OR_DEPARTMENT_OTHER): Payer: Self-pay

## 2021-10-23 ENCOUNTER — Encounter: Payer: Self-pay | Admitting: Family Medicine

## 2021-10-23 ENCOUNTER — Ambulatory Visit (INDEPENDENT_AMBULATORY_CARE_PROVIDER_SITE_OTHER): Payer: 59 | Admitting: Family Medicine

## 2021-10-23 VITALS — BP 120/70 | HR 71 | Temp 97.7°F | Ht 67.0 in | Wt 133.2 lb

## 2021-10-23 DIAGNOSIS — F419 Anxiety disorder, unspecified: Secondary | ICD-10-CM

## 2021-10-23 DIAGNOSIS — H9190 Unspecified hearing loss, unspecified ear: Secondary | ICD-10-CM

## 2021-10-23 DIAGNOSIS — Z0000 Encounter for general adult medical examination without abnormal findings: Secondary | ICD-10-CM | POA: Diagnosis not present

## 2021-10-23 DIAGNOSIS — E785 Hyperlipidemia, unspecified: Secondary | ICD-10-CM

## 2021-10-23 DIAGNOSIS — E538 Deficiency of other specified B group vitamins: Secondary | ICD-10-CM

## 2021-10-23 DIAGNOSIS — I7121 Aneurysm of the ascending aorta, without rupture: Secondary | ICD-10-CM

## 2021-10-23 DIAGNOSIS — E559 Vitamin D deficiency, unspecified: Secondary | ICD-10-CM

## 2021-10-23 LAB — LIPID PANEL
Cholesterol: 207 mg/dL — ABNORMAL HIGH (ref 0–200)
HDL: 58.4 mg/dL (ref >39.00)
LDL Cholesterol: 125 mg/dL — ABNORMAL HIGH (ref 0–99)
NonHDL: 148.22
Total CHOL/HDL Ratio: 4
Triglycerides: 116 mg/dL (ref 0.0–149.0)
VLDL: 23.2 mg/dL (ref 0.0–40.0)

## 2021-10-23 LAB — CBC WITH DIFFERENTIAL/PLATELET
Basophils Absolute: 0.1 10*3/uL (ref 0.0–0.1)
Basophils Relative: 1.2 % (ref 0.0–3.0)
Eosinophils Absolute: 0.2 10*3/uL (ref 0.0–0.7)
Eosinophils Relative: 2.8 % (ref 0.0–5.0)
HCT: 45 % (ref 36.0–46.0)
Hemoglobin: 15.1 g/dL — ABNORMAL HIGH (ref 12.0–15.0)
Lymphocytes Relative: 29.8 % (ref 12.0–46.0)
Lymphs Abs: 1.9 10*3/uL (ref 0.7–4.0)
MCHC: 33.6 g/dL (ref 30.0–36.0)
MCV: 91.7 fl (ref 78.0–100.0)
Monocytes Absolute: 0.5 10*3/uL (ref 0.1–1.0)
Monocytes Relative: 7.4 % (ref 3.0–12.0)
Neutro Abs: 3.7 10*3/uL (ref 1.4–7.7)
Neutrophils Relative %: 58.8 % (ref 43.0–77.0)
Platelets: 269 10*3/uL (ref 150.0–400.0)
RBC: 4.91 Mil/uL (ref 3.87–5.11)
RDW: 13.2 % (ref 11.5–15.5)
WBC: 6.3 10*3/uL (ref 4.0–10.5)

## 2021-10-23 LAB — COMPREHENSIVE METABOLIC PANEL
ALT: 18 U/L (ref 0–35)
AST: 21 U/L (ref 0–37)
Albumin: 4.6 g/dL (ref 3.5–5.2)
Alkaline Phosphatase: 56 U/L (ref 39–117)
BUN: 20 mg/dL (ref 6–23)
CO2: 28 mEq/L (ref 19–32)
Calcium: 9.4 mg/dL (ref 8.4–10.5)
Chloride: 101 mEq/L (ref 96–112)
Creatinine, Ser: 0.93 mg/dL (ref 0.40–1.20)
GFR: 65.72 mL/min (ref >60.00)
Glucose, Bld: 101 mg/dL — ABNORMAL HIGH (ref 70–99)
Potassium: 4.5 mEq/L (ref 3.5–5.1)
Sodium: 140 mEq/L (ref 135–145)
Total Bilirubin: 0.6 mg/dL (ref 0.2–1.2)
Total Protein: 6.9 g/dL (ref 6.0–8.3)

## 2021-10-23 LAB — VITAMIN B12: Vitamin B-12: 251 pg/mL (ref 211–911)

## 2021-10-23 LAB — VITAMIN D 25 HYDROXY (VIT D DEFICIENCY, FRACTURES): VITD: 38.86 ng/mL (ref 30.00–100.00)

## 2021-10-24 ENCOUNTER — Other Ambulatory Visit (HOSPITAL_BASED_OUTPATIENT_CLINIC_OR_DEPARTMENT_OTHER): Payer: Self-pay

## 2021-10-24 ENCOUNTER — Encounter: Payer: Self-pay | Admitting: *Deleted

## 2021-10-24 ENCOUNTER — Encounter: Payer: Self-pay | Admitting: Neurology

## 2021-10-24 ENCOUNTER — Telehealth (INDEPENDENT_AMBULATORY_CARE_PROVIDER_SITE_OTHER): Payer: 59 | Admitting: Neurology

## 2021-10-24 ENCOUNTER — Encounter (HOSPITAL_BASED_OUTPATIENT_CLINIC_OR_DEPARTMENT_OTHER): Payer: Self-pay | Admitting: Pharmacist

## 2021-10-24 DIAGNOSIS — G43009 Migraine without aura, not intractable, without status migrainosus: Secondary | ICD-10-CM | POA: Diagnosis not present

## 2021-10-24 DIAGNOSIS — G500 Trigeminal neuralgia: Secondary | ICD-10-CM

## 2021-10-24 MED ORDER — TOPIRAMATE 25 MG PO TABS
25.0000 mg | ORAL_TABLET | Freq: Every day | ORAL | 6 refills | Status: DC
  Filled 2021-10-24: qty 90, 90d supply, fill #0

## 2021-10-24 MED ORDER — BACLOFEN 10 MG PO TABS
5.0000 mg | ORAL_TABLET | Freq: Three times a day (TID) | ORAL | 6 refills | Status: DC | PRN
  Filled 2021-10-24: qty 30, 10d supply, fill #0

## 2021-10-25 ENCOUNTER — Other Ambulatory Visit (HOSPITAL_BASED_OUTPATIENT_CLINIC_OR_DEPARTMENT_OTHER): Payer: Self-pay

## 2021-10-27 LAB — LIPOPROTEIN A (LPA): Lipoprotein (a): 44 nmol/L (ref <75)

## 2021-11-13 ENCOUNTER — Ambulatory Visit (HOSPITAL_BASED_OUTPATIENT_CLINIC_OR_DEPARTMENT_OTHER)
Admission: RE | Admit: 2021-11-13 | Discharge: 2021-11-13 | Disposition: A | Discharge status: Home / Self Care | Payer: 59 | Source: Ambulatory Visit | Attending: Family Medicine | Admitting: Family Medicine

## 2021-11-13 ENCOUNTER — Encounter: Payer: Self-pay | Admitting: Family Medicine

## 2021-11-13 DIAGNOSIS — E785 Hyperlipidemia, unspecified: Secondary | ICD-10-CM | POA: Insufficient documentation

## 2021-11-14 ENCOUNTER — Other Ambulatory Visit (HOSPITAL_BASED_OUTPATIENT_CLINIC_OR_DEPARTMENT_OTHER): Payer: Self-pay

## 2021-11-14 ENCOUNTER — Encounter: Payer: Self-pay | Admitting: Family Medicine

## 2021-11-14 ENCOUNTER — Other Ambulatory Visit: Payer: Self-pay | Admitting: Family Medicine

## 2021-11-14 MED ORDER — EPINEPHRINE 0.3 MG/0.3ML IJ SOAJ
INTRAMUSCULAR | 0 refills | Status: DC
  Filled 2021-11-14: qty 2, 1d supply, fill #0

## 2021-11-17 ENCOUNTER — Other Ambulatory Visit: Payer: Self-pay

## 2021-11-17 DIAGNOSIS — R911 Solitary pulmonary nodule: Secondary | ICD-10-CM

## 2021-11-21 ENCOUNTER — Other Ambulatory Visit (HOSPITAL_BASED_OUTPATIENT_CLINIC_OR_DEPARTMENT_OTHER): Payer: Self-pay

## 2021-11-21 DIAGNOSIS — C50911 Malignant neoplasm of unspecified site of right female breast: Secondary | ICD-10-CM | POA: Diagnosis not present

## 2021-11-21 DIAGNOSIS — Z01419 Encounter for gynecological examination (general) (routine) without abnormal findings: Secondary | ICD-10-CM | POA: Diagnosis not present

## 2021-11-22 ENCOUNTER — Encounter: Payer: Self-pay | Admitting: Pulmonary Disease

## 2021-11-22 ENCOUNTER — Other Ambulatory Visit (HOSPITAL_BASED_OUTPATIENT_CLINIC_OR_DEPARTMENT_OTHER): Payer: Self-pay

## 2021-11-22 ENCOUNTER — Ambulatory Visit: Payer: 59 | Admitting: Pulmonary Disease

## 2021-11-22 VITALS — BP 120/78 | HR 75 | Ht 66.5 in | Wt 132.4 lb

## 2021-11-22 DIAGNOSIS — Z789 Other specified health status: Secondary | ICD-10-CM

## 2021-11-22 DIAGNOSIS — N941 Unspecified dyspareunia: Secondary | ICD-10-CM | POA: Insufficient documentation

## 2021-11-22 DIAGNOSIS — Z853 Personal history of malignant neoplasm of breast: Secondary | ICD-10-CM | POA: Diagnosis not present

## 2021-11-22 DIAGNOSIS — R911 Solitary pulmonary nodule: Secondary | ICD-10-CM | POA: Diagnosis not present

## 2021-11-22 MED ORDER — METRONIDAZOLE 0.75 % VA GEL
VAGINAL | 0 refills | Status: DC
  Filled 2021-11-22: qty 70, 5d supply, fill #0

## 2021-11-22 NOTE — Patient Instructions (Signed)
Thank you for visiting Dr. Valeta Harms at [facility]. Today we recommend the following:  Orders Placed This Encounter  Procedures   CT Chest Wo Contrast   See [place] after your ct chest   Return in about 9 months (around 08/24/2022) for with Eric Form, NP, or Dr. Valeta Harms.    Please do your part to reduce the spread of COVID-19.

## 2021-11-22 NOTE — Progress Notes (Signed)
Synopsis: Referred in July 2023 for lung nodule by Marin Olp, MD  Subjective:   PATIENT ID: [name] GENDER: female DOB: 08-May-1960, MRN: [id number]  Chief Complaint  Patient presents with   Consult    Pt consult for lung nodule, shown on recent CT     This is a 61 year old female, past medical history of breast cancer, diagnosed 2008.  History of chemotherapy and radiation related to this.  Lifelong non-smoker except for a few cigarettes at age 30.  Had a coronary calcium scoring CT scan complete which revealed a small 5 mm pulmonary nodule.  When I compare his CT imaging from 2018 until this scan in July 2023 there is a persistent nodule.  It may look now slightly more solid in comparison.  We reviewed the images and compared them today in the office.  No other significant risk factors for nodule or cancer development.    Past Medical History:  Diagnosis Date   Adhesive capsulitis of shoulder    Anxiety    Aortic aneurysm (HCC)    Breast cancer ([place])    Cancer ([place]) 2008   Breast   Congenital anomaly of aortic arch    Labyrinthitis, unspecified    Malignant neoplasm of breast (female), unspecified site    Other B-complex deficiencies    Other malaise and fatigue    Personal history of chemotherapy    Personal history of radiation therapy 2008   Unspecified hearing loss    Unspecified hereditary and idiopathic peripheral neuropathy    Unspecified tinnitus      Family History  Problem Relation Age of Onset   Seizures Mother    Migraines Mother    Allergic rhinitis Mother    Diabetes Father    Hypertension Father    Stroke Father        several - first stroke late 79s and died 87   Hyperlipidemia Father    Colon polyps Sister    Breast cancer Sister    Cancer Sister        breast   Breast cancer Maternal Grandmother    Cancer Other        breast     Past Surgical History:  Procedure Laterality Date   ABDOMINAL HYSTERECTOMY  04/2010    robotic   BASAL CELL CARCINOMA EXCISION  2003   rt eye area   [place]   Left   BREAST LUMPECTOMY Right Oct.29 & Mar 19, 2007   Right   IR GENERIC HISTORICAL  04/16/2016   IR RADIOLOGIST EVAL & MGMT 04/16/2016 [facility]-INTERV RAD   LAPAROSCOPY  1982   for endometriosis   wisdom teeth extracted      Social History   Socioeconomic History   Marital status: Married    Spouse name: Not on file   Number of children: 0   Years of education: 12   Highest education level: Not on file  Occupational History   Occupation: HAIR STYLIST    Employer: [employer]   Occupation: vocalist   Occupation: model  Tobacco Use   Smoking status: Never   Smokeless tobacco: Never  Vaping Use   Vaping Use: Never used  Substance and Sexual Activity   Alcohol use: Yes    Comment: wine with dinner a few nights a week   Drug use: No   Sexual activity: Yes  Other Topics Concern   Not on file  Social History Narrative   Married  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 Married '85, no children.    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 Strong interest in herbology and alternative healing.    Right-handed   Social Determinants of Radio broadcast assistant Strain: Not on file  Food Insecurity: Not on file  Transportation Needs: Not on file  Physical Activity: Not on file  Stress: Not on file  Social Connections: Not on file  Intimate Partner Violence: Not on file     Allergies  Allergen Reactions   Bee Venom Anaphylaxis   Other     kerajinan   Polysorbate Anaphylaxis    "Polysorbate 80 preservative"   Latex Hives   Lyrica [Pregabalin] Nausea And Vomiting and Other (See Comments)    Dizziness    Miralax [Polyethylene Glycol] Hives    pruritus   Doxycycline Hyclate Rash     Outpatient Medications Prior to Visit   Medication Sig Dispense Refill   b complex vitamins tablet Take 1 tablet by mouth daily.     baclofen (LIORESAL) 10 MG tablet Take 0.5-1 tablets (5-10 mg total) by mouth 3 (three) times daily as needed. 30 each 6   Cholecalciferol (VITAMIN D) 2000 UNITS CAPS Take by mouth daily.     Coenzyme Q10 (COQ10 PO) Take 100 mg by mouth 2 (two) times daily.     cyanocobalamin (,VITAMIN B-12,) 1000 MCG/ML injection Inject 1000 mcg once per month. 4 mL 3   EPINEPHrine 0.3 mg/0.3 mL IJ SOAJ injection INJECT 1 PEN INTO THE MUSCLE FOR ONE DOSE FOR BEE STING ALLERGY AS DIRECTED 2 each 0   escitalopram (LEXAPRO) 5 MG tablet Take 1.5 tablets (7.5 mg total) by mouth daily. 135 tablet 3   HYDROcodone-acetaminophen (NORCO/VICODIN) 5-325 MG tablet Take 1 tablet by mouth every 4 (four) hours as needed for moderate pain. 28 tablet 0   metroNIDAZOLE (METROGEL) 0.75 % vaginal gel Insert 1 applicatorful once every day by vaginal route. 70 g 0   Omega-3 Fatty Acids (FISH OIL) 1000 MG CAPS Take 1,000 mg by mouth 3 (three) times daily.     Probiotic Product (PROBIOTIC DAILY PO) Take by mouth daily.     SYRINGE-NEEDLE, DISP, 3 ML (B-D 3CC LUER-LOK SYR 25GX1") 25G X 1" 3 ML MISC Use with Vitamin B12 injection once a month 3 each 3   Turmeric 500 MG TABS Take 500 mg by mouth daily.     topiramate (TOPAMAX) 25 MG tablet Take 1 tablet (25 mg total) by mouth at bedtime. 90 tablet 6   No facility-administered medications prior to visit.    Review of Systems  Constitutional:  Negative for chills, fever, malaise/fatigue and weight loss.  HENT:  Negative for hearing loss, sore throat and tinnitus.   Eyes:  Negative for blurred vision and double vision.  Respiratory:  Negative for cough, hemoptysis, sputum production, shortness of breath, wheezing and stridor.   Cardiovascular:  Negative for chest pain, palpitations, orthopnea, leg swelling and PND.  Gastrointestinal:  Negative for abdominal pain, constipation, diarrhea,  heartburn, nausea and vomiting.  Genitourinary:  Negative for dysuria, hematuria and urgency.  Musculoskeletal:  Negative for joint pain and myalgias.  Skin:  Negative for itching and rash.  Neurological:  Negative for dizziness, tingling, weakness and headaches.  Endo/Heme/Allergies:  Negative for environmental allergies. Does not bruise/bleed easily.  Psychiatric/Behavioral:  Negative for depression. The patient  is not nervous/anxious and does not have insomnia.   All other systems reviewed and are negative.    Objective:  Physical Exam Vitals reviewed.  Constitutional:      General: She is not in acute distress.    Appearance: She is well-developed.  HENT:     Head: Normocephalic and atraumatic.  Eyes:     General: No scleral icterus.    Conjunctiva/sclera: Conjunctivae normal.     Pupils: Pupils are equal, round, and reactive to light.  Neck:     Vascular: No JVD.     Trachea: No tracheal deviation.  Cardiovascular:     Rate and Rhythm: Normal rate and regular rhythm.     Heart sounds: Normal heart sounds. No murmur heard. Pulmonary:     Effort: Pulmonary effort is normal. No tachypnea, accessory muscle usage or respiratory distress.     Breath sounds: No stridor. No wheezing, rhonchi or rales.  Abdominal:     General: There is no distension.     Palpations: Abdomen is soft.     Tenderness: There is no abdominal tenderness.  Musculoskeletal:        General: No tenderness.     Cervical back: Neck supple.  Lymphadenopathy:     Cervical: No cervical adenopathy.  Skin:    General: Skin is warm and dry.     Capillary Refill: Capillary refill takes less than 2 seconds.     Findings: No rash.  Neurological:     Mental Status: She is alert and oriented to person, place, and time.  Psychiatric:        Behavior: Behavior normal.      Vitals:   11/22/21 1559  BP: 120/78  Pulse: 75  SpO2: 99%  Weight: 132 lb 6.4 oz (60.1 kg)  Height: 5' 6.5" (1.689 m)   99% on  RA BMI Readings from Last 3 Encounters:  11/22/21 21.05 kg/m  10/23/21 20.86 kg/m  05/31/21 20.99 kg/m   Wt Readings from Last 3 Encounters:  11/22/21 132 lb 6.4 oz (60.1 kg)  10/23/21 133 lb 3.2 oz (60.4 kg)  05/31/21 134 lb (60.8 kg)     CBC    Component Value Date/Time   WBC 6.3 10/23/2021 0851   RBC 4.91 10/23/2021 0851   HGB 15.1 (H) 10/23/2021 0851   HGB 14.6 12/14/2016 0925   HCT 45.0 10/23/2021 0851   HCT 43.1 12/14/2016 0925   PLT 269.0 10/23/2021 0851   PLT 305 12/14/2016 0925   MCV 91.7 10/23/2021 0851   MCV 90.1 12/14/2016 0925   MCH 30.5 12/14/2016 0925   MCH 30.8 07/18/2012 1250   MCHC 33.6 10/23/2021 0851   RDW 13.2 10/23/2021 0851   RDW 13.0 12/14/2016 0925   LYMPHSABS 1.9 10/23/2021 0851   LYMPHSABS 1.6 12/14/2016 0925   MONOABS 0.5 10/23/2021 0851   MONOABS 0.5 12/14/2016 0925   EOSABS 0.2 10/23/2021 0851   EOSABS 0.3 12/14/2016 0925   BASOSABS 0.1 10/23/2021 0851   BASOSABS 0.1 12/14/2016 0925     Chest Imaging: 2018 CT chest: Faint to 5 mm subsolid appearing lesion in the right lateral aspect of the lung.  July 2023 coronary calcium CT scan: Appears similar size 5 mm lesion may be slightly more solid in comparison however the lung window imaging is slightly different based on the different types of CT imaging completed. The patient's images have been independently reviewed by me.    Pulmonary Functions Testing Results:    Latest Ref Rng &  Units 11/29/2014    3:00 PM  PFT Results  FVC-Pre L 3.73   FVC-Predicted Pre % 100   FVC-Post L 3.67   FVC-Predicted Post % 98   Pre FEV1/FVC % % 78   Post FEV1/FCV % % 81   FEV1-Pre L 2.90   FEV1-Predicted Pre % 99   FEV1-Post L 2.97   DLCO uncorrected ml/min/mmHg 20.42   DLCO UNC% % 73   DLVA Predicted % 74   TLC L 5.39   TLC % Predicted % 99   RV % Predicted % 81     FeNO:   Pathology:   Echocardiogram:   Heart Catheterization:     Assessment & Plan:     ICD-10-CM   1. Lung  nodule  R91.1 CT Chest Wo Contrast    CANCELED: CT Chest Wo Contrast    2. History of breast cancer  Z85.3     3. Nonsmoker  Z78.9       Discussion:  61 year old female, documented 5 mm lung nodule, some morphology change based on 2018 image until now.  Appears slightly more solid to me in nature.  Patient is a lifelong non-smoker.  Plan: She is anxious about the nodule we will go ahead and have follow-up scheduled for this to have a complete axial CT image done in 9 months from now. If the nodule is stable in 9 months then I think she can go out to 1 year.  Other than that she probably not gotten any additional follow-up if it does not change in size shape or morphology. Patient is agreeable to this plan. We appreciate consultation. We will see her back in April 2024 after the CT scan is complete, can see me or Eric Form, NP    Current Outpatient Medications:    b complex vitamins tablet, Take 1 tablet by mouth daily., Disp: , Rfl:    baclofen (LIORESAL) 10 MG tablet, Take 0.5-1 tablets (5-10 mg total) by mouth 3 (three) times daily as needed., Disp: 30 each, Rfl: 6   Cholecalciferol (VITAMIN D) 2000 UNITS CAPS, Take by mouth daily., Disp: , Rfl:    Coenzyme Q10 (COQ10 PO), Take 100 mg by mouth 2 (two) times daily., Disp: , Rfl:    cyanocobalamin (,VITAMIN B-12,) 1000 MCG/ML injection, Inject 1000 mcg once per month., Disp: 4 mL, Rfl: 3   EPINEPHrine 0.3 mg/0.3 mL IJ SOAJ injection, INJECT 1 PEN INTO THE MUSCLE FOR ONE DOSE FOR BEE STING ALLERGY AS DIRECTED, Disp: 2 each, Rfl: 0   escitalopram (LEXAPRO) 5 MG tablet, Take 1.5 tablets (7.5 mg total) by mouth daily., Disp: 135 tablet, Rfl: 3   HYDROcodone-acetaminophen (NORCO/VICODIN) 5-325 MG tablet, Take 1 tablet by mouth every 4 (four) hours as needed for moderate pain., Disp: 28 tablet, Rfl: 0   metroNIDAZOLE (METROGEL) 0.75 % vaginal gel, Insert 1 applicatorful once every day by vaginal route., Disp: 70 g, Rfl: 0   Omega-3 Fatty  Acids (FISH OIL) 1000 MG CAPS, Take 1,000 mg by mouth 3 (three) times daily., Disp: , Rfl:    Probiotic Product (PROBIOTIC DAILY PO), Take by mouth daily., Disp: , Rfl:    SYRINGE-NEEDLE, DISP, 3 ML (B-D 3CC LUER-LOK SYR 25GX1") 25G X 1" 3 ML MISC, Use with Vitamin B12 injection once a month, Disp: 3 each, Rfl: 3   Turmeric 500 MG TABS, Take 500 mg by mouth daily., Disp: , Rfl:     Garner Nash, DO [facility] 11/22/2021  4:48 PM

## 2021-11-23 ENCOUNTER — Other Ambulatory Visit (HOSPITAL_BASED_OUTPATIENT_CLINIC_OR_DEPARTMENT_OTHER): Payer: Self-pay

## 2021-11-23 MED ORDER — ESTRADIOL 0.1 MG/GM VA CREA
TOPICAL_CREAM | VAGINAL | 3 refills | Status: DC
  Filled 2021-11-23: qty 42.5, 30d supply, fill #0
  Filled 2022-03-13: qty 42.5, 30d supply, fill #1
  Filled 2022-10-10: qty 42.5, 30d supply, fill #2

## 2021-11-25 ENCOUNTER — Other Ambulatory Visit: Payer: Self-pay | Admitting: Neurology

## 2021-11-29 ENCOUNTER — Other Ambulatory Visit (HOSPITAL_BASED_OUTPATIENT_CLINIC_OR_DEPARTMENT_OTHER): Payer: Self-pay

## 2021-11-29 MED ORDER — HYDROCODONE-ACETAMINOPHEN 5-325 MG PO TABS
1.0000 | ORAL_TABLET | ORAL | 0 refills | Status: DC | PRN
Start: 2021-11-29 — End: 2022-02-26
  Filled 2021-11-29: qty 28, 5d supply, fill #0

## 2021-12-20 ENCOUNTER — Encounter: Payer: Self-pay | Admitting: Family Medicine

## 2021-12-21 ENCOUNTER — Other Ambulatory Visit (HOSPITAL_BASED_OUTPATIENT_CLINIC_OR_DEPARTMENT_OTHER): Payer: Self-pay

## 2021-12-21 ENCOUNTER — Ambulatory Visit (INDEPENDENT_AMBULATORY_CARE_PROVIDER_SITE_OTHER): Payer: 59 | Admitting: Internal Medicine

## 2021-12-21 ENCOUNTER — Encounter: Payer: Self-pay | Admitting: Internal Medicine

## 2021-12-21 VITALS — BP 110/62 | HR 82 | Temp 98.1°F | Resp 14 | Ht 66.5 in | Wt 133.4 lb

## 2021-12-21 DIAGNOSIS — S59901D Unspecified injury of right elbow, subsequent encounter: Secondary | ICD-10-CM

## 2021-12-21 DIAGNOSIS — N951 Menopausal and female climacteric states: Secondary | ICD-10-CM | POA: Insufficient documentation

## 2021-12-21 DIAGNOSIS — C50919 Malignant neoplasm of unspecified site of unspecified female breast: Secondary | ICD-10-CM | POA: Insufficient documentation

## 2021-12-21 MED ORDER — PREDNISONE 20 MG PO TABS
ORAL_TABLET | ORAL | 0 refills | Status: DC
  Filled 2021-12-21: qty 10, 7d supply, fill #0

## 2021-12-21 MED ORDER — KETOROLAC TROMETHAMINE 10 MG PO TABS: 10.0000 mg | ORAL_TABLET | Freq: Four times a day (QID) | ORAL | Status: AC

## 2021-12-21 MED ORDER — KETOROLAC TROMETHAMINE 60 MG/2ML IM SOLN
60.0000 mg | Freq: Once | INTRAMUSCULAR | Status: AC
  Administered 2021-12-21: 60 mg via INTRAMUSCULAR

## 2021-12-21 NOTE — Patient Instructions (Addendum)
Follow up as needed Call for phys therapy or sports medicine if pain persists or even orthopedics if you lose function  Elbow Bursitis  Elbow bursitis is the swelling of the fluid-filled sac (bursa) at the tip of the elbow. A bursa is like a cushion that protects the joint. If the bursa becomes irritated, it can fill with extra fluid and become swollen. What are the causes? Injury to the elbow. Leaning the elbow on a hard surface for a long time. Infection. Bone spurs. Certain conditions that cause swelling. Sometimes, the cause is not known. What are the signs or symptoms? The first sign of this condition is often swelling at the tip of the elbow. The swelling can grow to the size of a golf ball. Other symptoms include: Pain when bending or leaning on the elbow. Stiffness of the elbow. If the cause is infection, you may have: Redness, warmth, and tenderness. Pus coming from a cut near the elbow. How is this treated? Treatment depends on the cause. It may include: Medicines. Draining fluid from the bursa. Placing a bandage or pressure (compression) sleeve around the elbow. Wearing elbow pads. Surgery, if other treatments do not help. Follow these instructions at home: Medicines Take over-the-counter and prescription medicines only as told by your doctor. If you were prescribed an antibiotic medicine, take it as told by your doctor. Do not stop taking it even if you start to feel better. Managing pain, stiffness, and swelling     If told, put ice on the elbow. To do this: Put ice in a plastic bag. Place a towel between your skin and the bag. Leave the ice on for 20 minutes, 2-3 times a day. Take off the ice if your skin turns bright red. This is very important. If you cannot feel pain, heat, or cold, you have a greater risk of damage to the area. If told, put heat on the affected area. Do this as often as told by your doctor. Use the heat source that your doctor recommends,  such as a moist heat pack or a heating pad. Place a towel between your skin and the heat source. Leave the heat on for 20-30 minutes. Take off the heat if your skin turns bright red. This is very important. If you cannot feel pain, heat, or cold, you have a greater risk of getting burned. If your bursitis is caused by an injury, follow instructions from your doctor about: Resting your elbow. Wearing a bandage or sleeve. Wear elbow pads or elbow wraps as needed. These help cushion your elbow. General instructions Avoid any activities that cause elbow pain. Ask your doctor what activities are safe for you. Keep all follow-up visits. Contact a doctor if: You have a fever. You have problems that do not get better with treatment. You have pain or swelling that: Gets worse. Goes away and then comes back. You have pus draining from your elbow. You have redness around the elbow area. Your elbow feels warm to the touElbow Sprain An elbow sprain is an injury to one of the strong bands of tissue (ligaments) that connect the bones of the elbow. Ligaments connect the three bones that make up the elbow joint. This injury usually occurs on the inside of the elbow and rarely on the outside. There are three types of elbow sprains: A grade 1 sprain is a stretching of a ligament. This injury causes minor pain and swelling, but the joint remains stable. A grade 2 sprain is a  partial ligament tear. This injury may cause moderate pain and swelling, and a looseness in the joint that causes it to move more than normal (laxity). A grade 3 sprain is a complete ligament tear. This injury will cause severe pain and swelling, and the joint will become unstable. What are the causes? This condition may be caused by: A sudden injury (acute sprain). This may result from falling on an outstretched arm or severely twisting or straining your elbow joint. An overuse injury. This injury occurs gradually over time from doing  activities that involve the same elbow movements over and over again. This type of injury is common in activities that require overhand throwing. What are the signs or symptoms? Symptoms of this condition include: Pain. Pain that gets worse with elbow movement. Swelling. Bruising. Stiffness of the elbow joint. Laxity of the elbow joint. Inability to use the elbow joint. Tingling or numbness. Hearing a pop or feeling a tear at the time of the injury. How is this diagnosed? This condition may be diagnosed based on: Your symptoms and history of an injury or activity that puts stress on the elbow. A physical exam. The health care provider will check the movement and stability of your elbow. Imaging tests, such as an X-ray or MRI. These tests can show how severe the sprain is and can be used to rule out a broken bone or stress fracture. How is this treated? At first, this condition may be treated by protecting, resting, icing, applying pressure (compression), and raising (elevating) the injured elbow above the level of your heart. This is known as PRICE therapy. Additional treatment depends on the severity of the sprain. A grade 1 sprain may need only PRICE therapy. A grade 2 sprain may be treated with PRICE therapy and an elbow brace. A grade 3 sprain usually requires surgery to repair the ligament. Your health care provider may also recommend taking a nonsteroidal anti-inflammatory drug (NSAID) to reduce pain and swelling. You may also need to do certain exercises to maintain full movement and to strengthen the muscles of the shoulder, forearm, and elbow (physical therapy). Follow these instructions at home: If you have a brace: Wear the brace as told by your health care provider. Remove it only as told by your health care provider. Loosen the brace if your fingers tingle, become numb, or turn cold and blue. Keep the brace clean. If the brace is not waterproof: Do not let it get  wet. Cover it with a watertight covering when you take a bath or shower. Managing pain, stiffness, and swelling  If directed, put ice on your elbow: Put ice in a plastic bag. Place a towel between your skin and the bag. Leave the ice on for 20 minutes, 2-3 times a day. Move your fingers often to reduce stiffness and swelling. Elevate your elbow above the level of your heart while you are sitting or lying down. Activity Avoid activities that cause elbow pain. Return to your normal activities as told by your health care provider. Ask your health care provider what activities are safe for you. Ask your health care provider when it is safe to drive if you have an elbow brace. Do exercises as told by your health care provider. General instructions Wear an elastic bandage or compression wrap only as told by your health care provider. Take over-the-counter and prescription medicines only as told by your health care provider. Do not use any products that contain nicotine or tobacco, such as cigarettes,  e-cigarettes, and chewing tobacco. These can delay healing. If you need help quitting, ask your health care provider. Keep all follow-up visits as told by your health care provider. This is important. Contact a health care provider if: Your symptoms get worse. You develop new symptoms. Your symptoms have not improved with home care after two weeks. Get help right away if: You have severe pain. Your fingers turn white, very red, or cold and blue. Summary An elbow sprain is an injury to a ligament in the elbow. An elbow sprain can be from an acute injury or repetitive stress. Symptoms include pain, swelling, loss of movement, and bruising. At first, this condition may be treated by protecting, resting, icing, applying pressure (compression), and elevating the injured elbow above the level of your heart. This is known as PRICE therapy. Other treatments depend on the severity of the sprain. This  information is not intended to replace advice given to you by your health care provider. Make sure you discuss any questions you have with your health care provider. Document Revised: 07/14/2020 Document Reviewed: 07/14/2020 Elsevier Patient Education  [place] help right away if: You have trouble moving your arm, hand, or fingers. Summary Elbow bursitis is the swelling of the fluid-filled sac (bursa) at the tip of the elbow. You may need to take medicine or put ice on your elbow. Contact your doctor if your problems do not get better with treatment. Also, contact your doctor if your problems go away and then come back. This information is not intended to replace advice given to you by your health care provider. Make sure you discuss any questions you have with your health care provider. Document Revised: 04/11/2021 Document Reviewed: 04/11/2021 Elsevier Patient Education  [place]  Tennis elbow (lateral epicondylitis) is inflammation of tendons in your outer forearm, near your elbow. Tendons are tissues that connect muscle to bone. When you have tennis elbow, inflammation affects the tendons that you use to bend your wrist and move your hand up. Inflammation occurs in the lower part of the upper arm bone (humerus), where the tendons connect to the bone (lateral epicondyle). Tennis elbow often affects people who play tennis, but anyone may get the condition from repeatedly extending the wrist or turning the forearm. What are the causes? This condition is usually caused by repeatedly extending the wrist, turning the forearm, and using the hands. It can result from sports or work that requires repetitive forearm movements. In some cases, it may be caused by a sudden injury. What increases the risk? You are more likely to develop tennis elbow if you play tennis or another racket sport. You also have a higher risk if you frequently use your hands for  work. Besides people who play tennis, others at greater risk include: People who use computers. Architect workers. People who work in [employer]. Musicians. Cooks. Cashiers. What are the signs or symptoms? Symptoms of this condition include: Pain and tenderness in the forearm and the outer part of the elbow. Pain may be felt only when using the arm, or it may be there all the time. A burning feeling that starts in the elbow and spreads down the forearm. A weak grip in the hand. How is this diagnosed? This condition is diagnosed based on your symptoms, your medical history, and a physical exam. You may also have X-rays or an MRI to: Confirm the diagnosis. Look for other issues. Check for tears in the  ligaments, muscles, or tendons. How is this treated? Resting and icing your arm is often the first treatment. Your health care provider may also recommend: Medicines to reduce pain and inflammation. These may be in the form of a pill, topical gels, or shots of a steroid medicine (cortisone). An elbow strap to reduce stress on the area. Physical therapy. This may include massage or exercises or both. An elbow brace to restrict the movements that cause symptoms. If these treatments do not help relieve your symptoms, your health care provider may recommend surgery to remove damaged muscle and reattach healthy muscle to bone. Follow these instructions at home: If you have a brace or strap: Wear the brace or strap as told by your health care provider. Remove it only as told by your health care provider. Check the skin around the brace or strap every day. Tell your health care provider about any concerns. Loosen the brace if your fingers tingle, become numb, or turn cold and blue. Keep the brace clean. If the brace or strap is not waterproof: Do not let it get wet. Cover it with a watertight covering when you take a bath or a shower. Managing pain, stiffness, and swelling  If directed, put  ice on the injured area. To do this: If you have a removable brace or strap, remove it as told by your health care provider. Put ice in a plastic bag. Place a towel between your skin and the bag. Leave the ice on for 20 minutes, 2-3 times a day. Remove the ice if your skin turns bright red. This is very important. If you cannot feel pain, heat, or cold, you have a greater risk of damage to the area. Move your fingers often to reduce stiffness and swelling. Activity Rest your elbow and wrist and avoid activities that cause symptoms as told by your health care provider. Do physical therapy exercises as told by your health care provider. If you lift an object, lift it with your palm facing up. This reduces stress on your elbow. Lifestyle If your tennis elbow is caused by sports, check your equipment and make sure that: You use it correctly. It is good match for you. If your tennis elbow is caused by work or computer use, take frequent breaks to stretch your arm. Talk with your employer about ways to manage your condition at work. General instructions Take over-the-counter and prescription medicines only as told by your health care provider. Do not use any products that contain nicotine or tobacco. These products include cigarettes, chewing tobacco, and vaping devices, such as e-cigarettes. If you need help quitting, ask your health care provider. Keep all follow-up visits. This is important. How is this prevented? Before and after activity: Warm up and stretch before being active. Cool down and stretch after being active. Give your body time to rest between periods of activity. During activity: Make sure to use equipment that fits you. If you play tennis, put power in your stroke with your lower body. Avoid using your arm only. Maintain physical fitness, including: Strength. Flexibility. Endurance. Do exercises to strengthen the forearm muscles. Contact a health care provider if: You  have pain that gets worse or does not get better with treatment. You have numbness or weakness in your forearm, hand, or fingers. Get help right away if: Your pain is severe. You cannot move your wrist. Summary Tennis elbow (lateral epicondylitis) is inflammation of tendons in your outer forearm, near your elbow. Common symptoms include pain  and tenderness in your forearm and the outer part of your elbow. This condition is usually caused by repeatedly extending your wrist, turning your forearm, and using your hands. The first treatment is often resting and icing your arm to relieve symptoms. Further treatment may include taking medicine, getting physical therapy, wearing a brace or strap, or having surgery. This information is not intended to replace advice given to you by your health care provider. Make sure you discuss any questions you have with your health care provider. Document Revised: 10/27/2019 Document Reviewed: 10/27/2019 Elsevier Patient Education  [place].

## 2021-12-21 NOTE — Telephone Encounter (Signed)
Patient is scheduled for appointment with Dr. Randol Kern 12/21/21 at 4 pm

## 2021-12-21 NOTE — Progress Notes (Signed)
Routine Medical Office Visit  Patient: [name]  Date: 12/21/2021  PCP:  Marin Olp, MD  Today's Healthcare Provider: Loralee Pacas, MD   Assessment/Plan:   This is a very kind and Ultra[employer] light 61 year old lady who has chronic vitamin D deficiency and possibly osteoporosis who felt her lateral epicondyles make a popping noise and then it swell up when she hyperextended it to prevent herself from falling when stepping off a curb wrong.  I believe she likely tore the tendon there although it is possible that there is an avulsion injury and so we ordered an x-ray.  I offered physical therapy as well as sports medicine or Ortho referral but she wants to wait and see what x-ray does.  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  We also were able to give Toradol with confidence that it did not contain either of those allergens.  She was given his extensive instructions on managing elbow pain and using rest ice and compression sleeves.  I would have given her work restrictions but she is self-employed as a Theme park manager and does not intend to stop working even though it will make the healing slower  She was advised to call us or go to ER if her condition worsens    Subjective:   [name] is a 61 y.o. female with PMH significant for: Past Medical History:  Diagnosis Date   Adhesive capsulitis of shoulder    Anxiety    Aortic aneurysm ([place])    Breast cancer ([place])    Cancer ([place]) 2008   Breast   Congenital anomaly of aortic arch    Labyrinthitis, unspecified    Malignant neoplasm of breast (female), unspecified site    Other B-complex deficiencies    Other malaise and fatigue    Personal history of chemotherapy    Personal history of  radiation therapy 2008   Unspecified hearing loss    Unspecified hereditary and idiopathic peripheral neuropathy    Unspecified tinnitus      Presenting today with: Chief Complaint  Patient presents with   Elbow Injury    Right elbow has been swollen intermittently for about a week, gets worse as she works longer amounts of time and does more work. Pain radiates down to wrist sometimes. Pain is manageable.    Swelling over r olecranon and pain since a bag twisted her right elbow and she felt pop in right lateral elbow.  Now pain radiating down to hand from elbow.  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 Allergic to peg and polysorbate so cant have steroid injection.            Objective:  Physical Exam: BP 110/62 (BP Location: Left Arm, Patient Position: Sitting)   Pulse 82   Temp 98.1 F (36.7 C) (Temporal)   Resp 14   Ht 5' 6.5" (1.689 m)   Wt 133 lb 6.4 oz (60.5 kg)   SpO2 97%   BMI 21.21 kg/m    Gen: No acute distress, resting comfortably Psych: Normal affect and thought content  Problem specific physical exam findings:  FULL extension but weaker and painful at right elbow. Pain with extension Swelling mild over lateral r elbow  No results found for any  visits on 12/21/21.

## 2021-12-22 ENCOUNTER — Ambulatory Visit (HOSPITAL_BASED_OUTPATIENT_CLINIC_OR_DEPARTMENT_OTHER)
Admission: RE | Admit: 2021-12-22 | Discharge: 2021-12-22 | Disposition: A | Discharge status: Home / Self Care | Payer: 59 | Source: Ambulatory Visit | Attending: Internal Medicine | Admitting: Internal Medicine

## 2021-12-22 DIAGNOSIS — S59901D Unspecified injury of right elbow, subsequent encounter: Secondary | ICD-10-CM | POA: Diagnosis not present

## 2021-12-22 DIAGNOSIS — S59901A Unspecified injury of right elbow, initial encounter: Secondary | ICD-10-CM | POA: Diagnosis not present

## 2022-01-07 ENCOUNTER — Other Ambulatory Visit: Payer: Self-pay | Admitting: Family Medicine

## 2022-01-08 ENCOUNTER — Other Ambulatory Visit (HOSPITAL_COMMUNITY): Payer: Self-pay

## 2022-01-08 MED ORDER — ESCITALOPRAM OXALATE 5 MG PO TABS
7.5000 mg | ORAL_TABLET | Freq: Every day | ORAL | 3 refills | Status: DC
Start: 2022-01-08 — End: 2022-10-29
  Filled 2022-01-08: qty 45, 30d supply, fill #0
  Filled 2022-03-13: qty 45, 30d supply, fill #1
  Filled 2022-04-08: qty 45, 30d supply, fill #2
  Filled 2022-05-20: qty 45, 30d supply, fill #3
  Filled 2022-06-15: qty 45, 30d supply, fill #4
  Filled 2022-07-15: qty 45, 30d supply, fill #5
  Filled 2022-08-14: qty 45, 30d supply, fill #6
  Filled 2022-09-10: qty 45, 30d supply, fill #7
  Filled 2022-10-10: qty 45, 30d supply, fill #8

## 2022-01-15 ENCOUNTER — Other Ambulatory Visit (HOSPITAL_BASED_OUTPATIENT_CLINIC_OR_DEPARTMENT_OTHER): Payer: Self-pay

## 2022-01-15 MED ORDER — INFLUENZA VAC SPLIT QUAD 0.5 ML IM SUSY
PREFILLED_SYRINGE | INTRAMUSCULAR | 0 refills | Status: DC
  Filled 2022-01-15: qty 0.5, 1d supply, fill #0

## 2022-02-01 ENCOUNTER — Encounter: Payer: Self-pay | Admitting: Internal Medicine

## 2022-02-02 ENCOUNTER — Other Ambulatory Visit: Payer: Self-pay | Admitting: *Deleted

## 2022-02-02 DIAGNOSIS — S59901D Unspecified injury of right elbow, subsequent encounter: Secondary | ICD-10-CM

## 2022-02-26 ENCOUNTER — Other Ambulatory Visit (HOSPITAL_BASED_OUTPATIENT_CLINIC_OR_DEPARTMENT_OTHER): Payer: Self-pay

## 2022-02-26 ENCOUNTER — Telehealth: Payer: Self-pay | Admitting: Neurology

## 2022-02-26 ENCOUNTER — Telehealth (INDEPENDENT_AMBULATORY_CARE_PROVIDER_SITE_OTHER): Payer: 59 | Admitting: Neurology

## 2022-02-26 DIAGNOSIS — G500 Trigeminal neuralgia: Secondary | ICD-10-CM | POA: Diagnosis not present

## 2022-02-26 DIAGNOSIS — G43009 Migraine without aura, not intractable, without status migrainosus: Secondary | ICD-10-CM | POA: Diagnosis not present

## 2022-02-26 MED ORDER — CYCLOBENZAPRINE HCL 10 MG PO TABS
10.0000 mg | ORAL_TABLET | Freq: Three times a day (TID) | ORAL | 3 refills | Status: DC | PRN
  Filled 2022-02-26: qty 90, 30d supply, fill #0
  Filled 2022-10-10 – 2022-11-06 (×2): qty 90, 30d supply, fill #1
  Filled 2023-01-02: qty 90, 30d supply, fill #2
  Filled 2023-01-31: qty 90, 30d supply, fill #3
  Filled 2023-02-01: qty 20, 7d supply, fill #3

## 2022-02-26 MED ORDER — HYDROCODONE-ACETAMINOPHEN 5-325 MG PO TABS
1.0000 | ORAL_TABLET | ORAL | 0 refills | Status: DC | PRN
Start: 2022-02-26 — End: 2022-07-20
  Filled 2022-02-26: qty 60, 10d supply, fill #0

## 2022-02-26 NOTE — Telephone Encounter (Signed)
Can we schedule in one year via video with dr Jaynee Eagles

## 2022-02-26 NOTE — Patient Instructions (Addendum)
Nurtec and Ubrelvy new migraine acute management - look up for migraines  Non pharmaceutical treatments for migraines  Cefaly    Nerivio     gammaCore SapphireTM is the first and only FDA cleared non-invasive device to treat and prevent multiple types of headache pain via the vagus nerve. It's small, handheld and portable for quick and easy treatments whenever and wherever needed.     E-Neura - [email or web address].[email or web address]     Fascial release tools

## 2022-02-26 NOTE — Telephone Encounter (Signed)
See phone note

## 2022-02-26 NOTE — Progress Notes (Signed)
[facility]    Provider:  Dr Jaynee Eagles Referring Provider: Marin Olp, MD Primary Care Physician:  Marin Olp, MD  Virtual Visit via Video Note  I connected with [name] on 02/26/22 at  1:30 PM EDT by a video enabled telemedicine application and verified that I am speaking with the correct person using two identifiers.  Location: Patient: home Provider: office   I discussed the limitations of evaluation and management by telemedicine and the availability of in person appointments. The patient expressed understanding and agreed to proceed.  Follow Up Instructions:    I discussed the assessment and treatment plan with the patient. The patient was provided an opportunity to ask questions and all were answered. The patient agreed with the plan and demonstrated an understanding of the instructions.   The patient was advised to call back or seek an in-person evaluation if the symptoms worsen or if the condition fails to improve as anticipated.  I provided 30 minutes of non-face-to-face time during this encounter.   Melvenia Beam, MD  CC:  Atypical face pain, likely trigeminal neuralgia  02/26/2022: She did not do well on Topiramate. She likes flexeril. She likes topical magnesium and heat/ice. If really bad she will do flexeril. She hasnot had the TGN for 6 weeks and when she does the vicodin takes care of it. Migraines have decreased. We discussed non pharmaceutical ways to try to control migraines, and other options like the new CGRP medications. Refillmed meds  Patient complains of symptoms per HPI as well as the following symptoms: migraine,TGN . Pertinent negatives and positives per HPI. All others negative   June 27/2023: She is having more migraines and trigeminal neuralgia in V2/V3 shoots into the face. Having nausea. Migraines are on the right, can last > 4 hours, pulsating/throbbing, also pain in the right ear, she has light and  sound sensitivity, nausea, big shifts in barometric pressure can trigger, she went to rehab to work on neck and shoulders, she is having neck and shoulder pain. She tries topical magnesium, peppermint on her face, she tried flexeril, she takes 1/2 vicodin. She is trying anti-inflammatory herbs. She also has some chronic right ear issues after chemotherapy and she has a hearing aid. Variable she can have 5 migraine days a month and the TGN can be daily or 2x a month. TGN searing shooting pain. She will try going for a walk. We discussed procedures like RFA on the TGN but she is nervous about surgery, we discussed medications. She tried Lyrica and Gabapentin(side effects), flexeril gives her hang over, She has tried baclofen but can't remember what happened.   Patient complains of symptoms per HPI as well as the following symptoms: head pain . Pertinent negatives and positives per HPI. All others negative   Interval history August 15, 2017: Patient returns today with her husband today.  Her husband provides much information.  She continues to have trigeminal neuralgia and is very hesitant to try medications.  She also continues to have myofascial cervical pain which contributes to her headaches and migraines as well as her trigeminal neuralgia.  She is been to physical therapy and she has had dry needling.  She also has dizziness related to her neck pain discussed cervicogenic dizziness today as well.  Right trigeminal neuralgia she uses Vicodin very sparingly, I did discuss the risks of this long-term and we discussed botulinum toxin for trigeminal neuralgia, reviewed the literature and localization but the risks are definitely  facial droop as the trigeminal nerve is close to the facial nerve in the area as we would inject Botox for the trigeminal neuralgia.  For her significant cervical fascial pain we discussed Botox injections there as well and all the side effects which include decreased breathing, death,  weakness, difficulty swallowing and other significant side effects.  We will refill her Vicodin.  Also gave her literature on cervicogenic dizziness.  Xeomin SCM 5units bilaterally, Levator Scapulae 5 units bilaterally (posterior to the SCM), 15 units in each of 3 locations in the bilateral trapezius.   Interval history: She has pain 24x7, she is tingling with paresthesias on the right in the V2/V3 distribution, deep at the front of the ear and in the temple and jaw, her molares hurt and are throbbing. She has been to the dentist. She gets "squiggly lines" in the left eye and starting to have pain on the left side as well. She also has sharp, shooting pain, brief, severe on the right in the V2/V3 distribution. She takes feverfew, CoQ10, cumin seed oil, she takes a B complex with B2 in it. Discussed CoQ10. She has pulsatile Tinnitus in the left ear. She has pain in the right ear so difficult to wear hearing aids. She has negative pressure in both ears. She can't get them to pop. When they pop she can hear well and then she has. Discussed dry needling. Pain in her face is like a hot poker and travels down the V2/v3 area severe, pain is continuous.   HPI:  [name] is a 61 y.o. female here as a referral from Dr. Yong Channel for trigeminal neuralgia.   January 2017 in the setting of stress she had a migraine. She had a migraine and as January went on her right temple started hurting. She had a bite guard made. Starts at the ear very deep and it shoots to the right side of the face to the temple and cheek, diffuse numbness. Felt like her ear was underwater. She tried essential oils, herbs, massage and exercise. She went to a chiropractor. She thought she had an ear infection. It can be very painful. Vicadin helps. Ice/heat helps. She also puts on topical magnesium. Initially the pain was constant, they went to [facility]. She tried Lyrica and did not tolerate. She has tried gabapentin in the past and she gets  dizzy and off balance. She has a lot of side effects to medications. She is trying herbs like Black cumin, curcumin and boswella seed oil which is helping.  She has improved. Now the weather affects it. Not shooting or searing, a dull deep pain. Like Novacaine and ear and temple pain. She is having a migraine. No known triggers such as brushing or wind or eating. It is pounding and throbbing. Light does not bother. No sound sensitivity. Daily medications. She feels it is manageable. No other focal neurologic deficits, associated symptoms, inciting events or modifiable factors.   Reviewed notes, labs and imaging from outside physicians, which showed:   MRI brain : Unusually prominent and asymmetric loop of the RIGHT anterior inferior cerebellar artery projects far into the internal auditory canal, approaching the fundus. Correlate clinically as a cause for pulsatile tinnitus.   Review of Systems: Patient complains of symptoms per HPI as well as the following symptoms: no rash, no lesions, no SOB, no CP. Pertinent negatives and positives per HPI. All others negative.  Social History   Socioeconomic History   Marital status: Married  Spouse name: Not on file   Number of children: 0   Years of education: 43   Highest education level: Not on file  Occupational History   Occupation: HAIR STYLIST    Employer: [employer]   Occupation: vocalist   Occupation: model  Tobacco Use   Smoking status: Never   Smokeless tobacco: Never  Vaping Use   Vaping Use: Never used  Substance and Sexual Activity   Alcohol use: Yes    Comment: wine with dinner a few nights a week   Drug use: No   Sexual activity: Yes  Other Topics Concern   Not on file  Social History Narrative   Married      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 Married '85, no children.    PriorWork - vocalist/muscian, stylist, colorist, artisan, active in her community  around issues of breast cancer awareness. Strong interest in herbology and alternative healing.    Right-handed   Social Determinants of Health   Financial Resource Strain: Not on file  Food Insecurity: Not on file  Transportation Needs: Not on file  Physical Activity: Not on file  Stress: Not on file  Social Connections: Not on file  Intimate Partner Violence: Not on file    Family History  Problem Relation Age of Onset   Seizures Mother    Migraines Mother    Allergic rhinitis Mother    Diabetes Father    Hypertension Father    Stroke Father        several - first stroke late 20s and died 70   Hyperlipidemia Father    Colon polyps Sister    Breast cancer Sister    Cancer Sister        breast   Breast cancer Maternal Grandmother    Cancer Other        breast    Past Medical History:  Diagnosis Date   Adhesive capsulitis of shoulder    Anxiety    Aortic aneurysm ([place])    Breast cancer ([place])    Cancer ([place]) 2008   Breast   Congenital anomaly of aortic arch    Labyrinthitis, unspecified    Malignant neoplasm of breast (female), unspecified site    Other B-complex deficiencies    Other malaise and fatigue    Personal history of chemotherapy    Personal history of radiation therapy 2008   Unspecified hearing loss    Unspecified hereditary and idiopathic peripheral neuropathy    Unspecified tinnitus     Past Surgical History:  Procedure Laterality Date   ABDOMINAL HYSTERECTOMY  04/2010   robotic   BASAL CELL CARCINOMA EXCISION  2003   rt eye area   [place]   Left   BREAST LUMPECTOMY Right Oct.29 & Mar 19, 2007   Right   IR GENERIC HISTORICAL  04/16/2016   IR RADIOLOGIST EVAL & MGMT 04/16/2016 [facility] RAD   LAPAROSCOPY  1982   for endometriosis   wisdom teeth extracted      Current Outpatient Medications  Medication Sig Dispense Refill   cyclobenzaprine (FLEXERIL) 10 MG tablet Take 1 tablet (10 mg total) by mouth 3 (three)  times daily as needed for muscle spasms. 90 tablet 3   b complex vitamins tablet Take 1 tablet by mouth daily.     Cholecalciferol (VITAMIN D) 2000 UNITS CAPS Take by mouth daily. (Patient not taking: Reported on 12/21/2021)  Cholecalciferol 125 MCG (5000 UT) TABS      Coenzyme Q10 (COQ10 PO) Take 100 mg by mouth 2 (two) times daily.     cyanocobalamin (,VITAMIN B-12,) 1000 MCG/ML injection Inject 1000 mcg once per month. 4 mL 3   EPINEPHrine 0.3 mg/0.3 mL IJ SOAJ injection INJECT 1 PEN INTO THE MUSCLE FOR ONE DOSE FOR BEE STING ALLERGY AS DIRECTED 2 each 0   escitalopram (LEXAPRO) 5 MG tablet Take 1.5 tablets (7.5 mg total) by mouth daily. 135 tablet 3   estradiol (ESTRACE) 0.1 MG/GM vaginal cream Insert 2 grams per vagina daily for 2 weeks, then every 2-3 days for 1 week, then 1x/week for a week, then prn. 42.5 g 3   HYDROcodone-acetaminophen (NORCO/VICODIN) 5-325 MG tablet Take 1 tablet by mouth every 4 (four) hours as needed for moderate pain. 60 tablet 0   influenza vac split quadrivalent PF (FLUARIX) 0.5 ML injection Inject into the muscle. 0.5 mL 0   Omega-3 Fatty Acids (FISH OIL) 1000 MG CAPS Take 1,000 mg by mouth 3 (three) times daily.     predniSONE (DELTASONE) 20 MG tablet Take 2 pills for 3 days, 1 pill for 4 days 10 tablet 0   Probiotic Product (PROBIOTIC DAILY PO) Take by mouth daily.     SYRINGE-NEEDLE, DISP, 3 ML (B-D 3CC LUER-LOK SYR 25GX1") 25G X 1" 3 ML MISC Use with Vitamin B12 injection once a month 3 each 3   Turmeric 500 MG TABS Take 500 mg by mouth daily.     Zoster Vaccine Adjuvanted [facility]) injection      No current facility-administered medications for this visit.    Allergies as of 02/26/2022 - Review Complete 12/21/2021  Allergen Reaction Noted   Bee venom Anaphylaxis 12/14/2014   Methylprednisolone Other (See Comments) and Anaphylaxis    Other     Polysorbate Anaphylaxis 02/28/2012   Latex Hives    Lyrica [pregabalin] Nausea And Vomiting and Other (See  Comments) 09/17/2016   Miralax [polyethylene glycol] Hives 07/09/2019   Doxycycline hyclate Rash 09/17/2016    Vitals: There were no vitals taken for this visit. Last Weight:  Wt Readings from Last 1 Encounters:  12/21/21 133 lb 6.4 oz (60.5 kg)   Last Height:   Ht Readings from Last 1 Encounters:  12/21/21 5' 6.5" (1.689 m)    Physical exam: Exam: Gen: NAD, conversant      CV:  Denies palpitations or chest pain or SOB. VS: Breathing at a normal rate.  Not febrile. Eyes: Conjunctivae clear without exudates or hemorrhage  Neuro: Detailed Neurologic Exam  Speech:    Speech is normal; fluent and spontaneous with normal comprehension.  Cognition:    The patient is oriented to person, place, and time;     recent and remote memory intact;     language fluent;     normal attention, concentration,     fund of knowledge Cranial Nerves:    The pupils are equal, round, and reactive to light. Attempted, Cannot perform fundoscopic exam. Visual fields are full. Extraocular movements are intact.  The face is symmetric with normal sensation. The palate elevates in the midline. Hearing intact. Voice is normal. Shoulder shrug is normal. The tongue has normal motion without fasciculations.   Coordination:    Normal finger to nose  Gait:    Normal native gait  Motor Observation:   no involuntary movements noted. Tone:    Appears normal  Posture:    Posture is normal. normal erect  Strength:    Strength is anti-gravity and symmetric in the upper limbs.         Assessment/Plan:  Patient with TGN and migraines also myofascial cervical pain and cervicogenic dizziness. MRI showed a vascular loop possibly compressing the trigeminal nerve at the origin. Things have been going well so we will keep her on vicoden and flexeril, we discussed the cgrps and nurtec and ubrelvy but she has many allergies and reacts to medications poorly so has been heard to try new things. She has tried  feverfew.    - Trigeminal neuralgia vs Migraines or both also myofascial cervical pain and cervicogenic dizziness - Discussed dry needling, she did go to PT for this and still does her exercises - She tried Lyric and Gabapentin and flexeril and had significant side effects, recommend Tegretol or Trileptal or Topiramate.Continue Vicodin at this time very sparingly and will try Topiramate and baclofen(both caused side effects) - Continue vicodin and flexeril prn - Trigeminal and Trigger point injections, botox in the cervical muscles were other options - She is not interested in decompression therapy for the vascular loop seen on MRI or any other procedures Discussed non-pharmaceuticals - we will leave cephaly at the front for her to try  Non pharmaceutical treatments for migraines  Cefaly    Nerivio     gammaCore SapphireTM is the first and only FDA cleared non-invasive device to treat and prevent multiple types of headache pain via the vagus nerve. It's small, handheld and portable for quick and easy treatments whenever and wherever needed.     E-Neura - https://fuller.com/     Fascial release tools      Sarina Ill, MD  [facility] [address] [place], [place] [address]  Phone [phone] Fax [phone]

## 2022-02-26 NOTE — Telephone Encounter (Signed)
Sent mychart msg informing pt of follow up scheduled with Dr. Jaynee Eagles.

## 2022-02-27 ENCOUNTER — Other Ambulatory Visit (HOSPITAL_BASED_OUTPATIENT_CLINIC_OR_DEPARTMENT_OTHER): Payer: Self-pay

## 2022-03-06 ENCOUNTER — Other Ambulatory Visit: Payer: Self-pay | Admitting: Obstetrics and Gynecology

## 2022-03-06 DIAGNOSIS — Z1231 Encounter for screening mammogram for malignant neoplasm of breast: Secondary | ICD-10-CM

## 2022-03-13 ENCOUNTER — Other Ambulatory Visit (HOSPITAL_BASED_OUTPATIENT_CLINIC_OR_DEPARTMENT_OTHER): Payer: Self-pay

## 2022-03-13 NOTE — Progress Notes (Unsigned)
[place] [facility] Phone: [phone] Subjective:   [name], am serving as a scribe for Dr. Hulan Saas.  I'm seeing this patient by the request  of:  Marin Olp, MD  CC: right elbow pain   [id number]  [name] is a 61 y.o. female coming in with complaint of R elbow pain. Tripped and her tote bag hit her R elbow over olecranon and lateral epicondyle. Heard loud pop. Pain over lateral epicondyle now. Patient states that she is having weakness in arm. 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 Using compression and chopat strap. Pain can radiate up and down the arm.   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 Tried working with clay the other day and pain increased afterwards but by the next day her pain improved.   R elbow xray 12/22/2021 IMPRESSION: No evidence of acute fracture, dislocation or elbow joint effusion.       Past Medical History:  Diagnosis Date   Adhesive capsulitis of shoulder    Anxiety    Aortic aneurysm ([place])    Breast cancer ([place])    Cancer ([place]) 2008   Breast   Congenital anomaly of aortic arch    Labyrinthitis, unspecified    Malignant neoplasm of breast (female), unspecified site    Other B-complex deficiencies    Other malaise and fatigue    Personal history of chemotherapy    Personal history of radiation therapy 2008   Unspecified hearing loss    Unspecified hereditary and idiopathic peripheral neuropathy    Unspecified tinnitus    Past Surgical History:  Procedure Laterality Date   ABDOMINAL HYSTERECTOMY  04/2010   robotic   BASAL CELL CARCINOMA EXCISION  2003   rt eye area   [place]   Left   BREAST LUMPECTOMY Right Oct.29 & Mar 19, 2007   Right   IR GENERIC HISTORICAL  04/16/2016   IR RADIOLOGIST EVAL & MGMT 04/16/2016 [facility]   LAPAROSCOPY  1982   for endometriosis   wisdom teeth  extracted     Social History   Socioeconomic History   Marital status: Married    Spouse name: Not on file   Number of children: 0   Years of education: 12   Highest education level: Not on file  Occupational History   Occupation: HAIR STYLIST    Employer: [employer]   Occupation: vocalist   Occupation: model  Tobacco Use   Smoking status: Never   Smokeless tobacco: Never  Vaping Use   Vaping Use: Never used  Substance and Sexual Activity   Alcohol use: Yes    Comment: wine with dinner a few nights a week   Drug use: No   Sexual activity: Yes  Other Topics Concern   Not on file  Social History Narrative   Married      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 Married '85, no children.    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 Strong interest in herbology and alternative healing.    Right-handed   Social Determinants of Health   Financial Resource Strain: Not on file  Food Insecurity: Not on file  Transportation Needs: Not on file  Physical Activity: Not on file  Stress: Not on file  Social Connections: Not on file   Allergies  Allergen Reactions   Bee Venom Anaphylaxis   Methylprednisolone Other (See Comments) and Anaphylaxis    Injection only - has polysorbate 80 preservative   Other     kerajinan   Polysorbate Anaphylaxis    "Polysorbate 80 preservative"   Latex Hives   Lyrica [Pregabalin] Nausea And Vomiting and Other (See Comments)    Dizziness    Miralax [Polyethylene Glycol] Hives    pruritus   Doxycycline Hyclate Rash   Family History  Problem Relation Age of Onset   Seizures Mother    Migraines Mother    Allergic rhinitis Mother    Diabetes Father    Hypertension Father    Stroke Father        several - first stroke late 36s and died 70   Hyperlipidemia Father    Colon polyps Sister    Breast  cancer Sister    Cancer Sister        breast   Breast cancer Maternal Grandmother    Cancer Other        breast    Current Outpatient Medications (Endocrine & Metabolic):    predniSONE (DELTASONE) 20 MG tablet, Take 2 pills for 3 days, 1 pill for 4 days  Current Outpatient Medications (Cardiovascular):    EPINEPHrine 0.3 mg/0.3 mL IJ SOAJ injection, INJECT 1 PEN INTO THE MUSCLE FOR ONE DOSE FOR BEE STING ALLERGY AS DIRECTED   Current Outpatient Medications (Analgesics):    HYDROcodone-acetaminophen (NORCO/VICODIN) 5-325 MG tablet, Take 1 tablet by mouth every 4 (four) hours as needed for moderate pain.  Current Outpatient Medications (Hematological):    cyanocobalamin (,VITAMIN B-12,) 1000 MCG/ML injection, Inject 1000 mcg once per month.  Current Outpatient Medications (Other):    b complex vitamins tablet, Take 1 tablet by mouth daily.   Cholecalciferol 125 MCG (5000 UT) TABS,    Coenzyme Q10 (COQ10 PO), Take 100 mg by mouth 2 (two) times daily.   cyclobenzaprine (FLEXERIL) 10 MG tablet, Take 1 tablet (10 mg total) by mouth 3 (three) times daily as needed for muscle spasms.   escitalopram (LEXAPRO) 5 MG tablet, Take 1.5 tablets (7.5 mg total) by mouth daily.   estradiol (ESTRACE) 0.1 MG/GM vaginal cream, Insert 2 grams per vagina daily for 2 weeks, then every 2-3 days for 1 week, then 1x/week for a week, then prn.   influenza vac split quadrivalent PF (FLUARIX) 0.5 ML injection, Inject into the muscle.   Omega-3 Fatty Acids (FISH OIL) 1000 MG CAPS, Take 1,000 mg by mouth 3 (three) times daily.   Probiotic Product (PROBIOTIC DAILY PO), Take by mouth daily.   SYRINGE-NEEDLE, DISP, 3 ML (B-D 3CC LUER-LOK SYR 25GX1") 25G X 1" 3 ML MISC, Use with Vitamin B12 injection once a month   Turmeric 500 MG TABS, Take 500 mg by mouth daily.   Zoster Vaccine Adjuvanted [facility]) injection,    Cholecalciferol (VITAMIN D) 2000 UNITS CAPS, Take by mouth daily. (Patient not taking: Reported on  12/21/2021)   Reviewed prior external information including notes and imaging from  primary care provider As well as notes that were available from care everywhere and other healthcare systems.  Past medical history, social, surgical and family history all reviewed in electronic medical record.  No pertanent information unless stated regarding to the chief complaint.   Review of Systems:  No headache, visual changes, nausea, vomiting, diarrhea, constipation, dizziness, abdominal pain, skin rash, fevers, chills, night sweats, weight loss,  swollen lymph nodes, body aches, joint swelling, chest pain, shortness of breath, mood changes. POSITIVE muscle aches  Objective  Blood pressure 132/82, pulse 86, height 5' 6.5" (1.689 m), weight 135 lb (61.2 kg), SpO2 98 %.   General: No apparent distress alert and oriented x3 mood and affect normal, dressed appropriately.  HEENT: Pupils equal, extraocular movements intact  Respiratory: Patient's speak in full sentences and does not appear short of breath  Cardiovascular: No lower extremity edema, non tender, no erythema  Right elbow exam shows the patient does have some swelling over the lateral epicondylar region.  Patient does have pain with resisted extension of the wrist noted.  Patient has good grip strength though noted otherwise.  Limited muscular skeletal ultrasound was performed and interpreted by Hulan Saas, M   Limited ultrasound of patient's lateral elbow shows the patient does have significant hypoechoic changes and what appears to be a partial tear of the common extensor tendon.  Significant increase in Doppler flow in neovascularization in the area. Impression: Partial common extensor tendon tear    Impression and Recommendations:     The above documentation has been reviewed and is accurate and complete Lyndal Pulley, DO

## 2022-03-14 ENCOUNTER — Other Ambulatory Visit (HOSPITAL_BASED_OUTPATIENT_CLINIC_OR_DEPARTMENT_OTHER): Payer: Self-pay

## 2022-03-14 ENCOUNTER — Encounter: Payer: Self-pay | Admitting: Family Medicine

## 2022-03-14 ENCOUNTER — Ambulatory Visit: Payer: Self-pay

## 2022-03-14 ENCOUNTER — Ambulatory Visit: Payer: 59 | Admitting: Family Medicine

## 2022-03-14 VITALS — BP 132/82 | HR 86 | Ht 66.5 in | Wt 135.0 lb

## 2022-03-14 DIAGNOSIS — S56511A Strain of other extensor muscle, fascia and tendon at forearm level, right arm, initial encounter: Secondary | ICD-10-CM | POA: Diagnosis not present

## 2022-03-14 DIAGNOSIS — M25521 Pain in right elbow: Secondary | ICD-10-CM

## 2022-03-14 NOTE — Patient Instructions (Addendum)
Good to see you  Lateral epicondylitis exercises given  Wrist brace no thumb wear day and night for two weeks then nightly for two weeks Ice 20 minutes 2 times daily. Usually after activity and before bed. Voltaren OTC  Tennis ball in back right pocket Piriformis exercises given  See me again in 6 weeks

## 2022-03-14 NOTE — Assessment & Plan Note (Signed)
Partial tear noted.  Discussed with patient about icing regimen and home exercises, we discussed with patient about other treatment options such as nitroglycerin but unfortunately history of migraines patient should do relatively well with conservative therapy.  Wrist brace given today to avoid repetitive extension of the wrist.  Patient could be a candidate for the possibility of viscosupplementation.  Reviewed patient's x-rays from primary care provider that did not show any significant findings that were concerning for any other bony abnormality.  Follow-up again in 6 weeks

## 2022-04-09 ENCOUNTER — Other Ambulatory Visit (HOSPITAL_BASED_OUTPATIENT_CLINIC_OR_DEPARTMENT_OTHER): Payer: Self-pay

## 2022-04-25 NOTE — Progress Notes (Signed)
[name] Phone: [phone] Subjective:   [name], am serving as a scribe for Dr. Hulan Saas.  I'm seeing this patient by the request  of:  Marin Olp, MD  CC: Elbow pain follow-up  ERX:[id number]   03/16/2022 Partial tear noted.  Discussed with patient about icing regimen and home exercises, we discussed with patient about other treatment options such as nitroglycerin but unfortunately history of migraines patient should do relatively well with conservative therapy.  Wrist brace given today to avoid repetitive extension of the wrist.  Patient could be a candidate for the possibility of viscosupplementation.  Reviewed patient's x-rays from primary care provider that did not show any significant findings that were concerning for any other bony abnormality.  Follow-up again in 6 weeks     Update 05/01/2022 [name] is a 61 y.o. female coming in with complaint of R elbow pain. Patient states that she was diligent about wearing the brace. Sexton longer having shooting pain in forearm. Patient has a spot over lateral epicondyle that will throb from time to time. Overall, feeling much better.       Past Medical History:  Diagnosis Date   Adhesive capsulitis of shoulder    Anxiety    Aortic aneurysm ([place])    Breast cancer ([place])    Cancer ([place]) 2008   Breast   Congenital anomaly of aortic arch    Labyrinthitis, unspecified    Malignant neoplasm of breast (female), unspecified site    Other B-complex deficiencies    Other malaise and fatigue    Personal history of chemotherapy    Personal history of radiation therapy 2008   Unspecified hearing loss    Unspecified hereditary and idiopathic peripheral neuropathy    Unspecified tinnitus    Past Surgical History:  Procedure Laterality Date   ABDOMINAL HYSTERECTOMY  04/2010   robotic   BASAL CELL CARCINOMA EXCISION  2003   rt eye area    [place]   Left   BREAST LUMPECTOMY Right Oct.29 & Mar 19, 2007   Right   IR GENERIC HISTORICAL  04/16/2016   IR RADIOLOGIST EVAL & MGMT 04/16/2016 [facility]   LAPAROSCOPY  1982   for endometriosis   wisdom teeth extracted     Social History   Socioeconomic History   Marital status: Married    Spouse name: Not on file   Number of children: 0   Years of education: 12   Highest education level: Not on file  Occupational History   Occupation: HAIR STYLIST    Employer: [employer]   Occupation: vocalist   Occupation: model  Tobacco Use   Smoking status: Never   Smokeless tobacco: Never  Vaping Use   Vaping Use: Never used  Substance and Sexual Activity   Alcohol use: Yes    Comment: wine with dinner a few nights a week   Drug use: Sexton   Sexual activity: Yes  Other Topics Concern   Not on file  Social History Narrative   Married      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 Married '85, Sexton children.    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 Strong interest in herbology and alternative healing.    Right-handed   Social Determinants of Health  Financial Resource Strain: Not on file  Food Insecurity: Not on file  Transportation Needs: Not on file  Physical Activity: Not on file  Stress: Not on file  Social Connections: Not on file   Allergies  Allergen Reactions   Bee Venom Anaphylaxis   Methylprednisolone Other (See Comments) and Anaphylaxis    Injection only - has polysorbate 80 preservative   Other     kerajinan   Polysorbate Anaphylaxis    "Polysorbate 80 preservative"   Latex Hives   Lyrica [Pregabalin] Nausea And Vomiting and Other (See Comments)    Dizziness    Miralax [Polyethylene Glycol] Hives    pruritus   Doxycycline Hyclate Rash   Family History  Problem Relation Age of  Onset   Seizures Mother    Migraines Mother    Allergic rhinitis Mother    Diabetes Father    Hypertension Father    Stroke Father        several - first stroke late 2s and died 81   Hyperlipidemia Father    Colon polyps Sister    Breast cancer Sister    Cancer Sister        breast   Breast cancer Maternal Grandmother    Cancer Other        breast    Current Outpatient Medications (Endocrine & Metabolic):    predniSONE (DELTASONE) 20 MG tablet, Take 2 pills for 3 days, 1 pill for 4 days  Current Outpatient Medications (Cardiovascular):    EPINEPHrine 0.3 mg/0.3 mL IJ SOAJ injection, INJECT 1 PEN INTO THE MUSCLE FOR ONE DOSE FOR BEE STING ALLERGY AS DIRECTED   Current Outpatient Medications (Analgesics):    HYDROcodone-acetaminophen (NORCO/VICODIN) 5-325 MG tablet, Take 1 tablet by mouth every 4 (four) hours as needed for moderate pain.  Current Outpatient Medications (Hematological):    cyanocobalamin (,VITAMIN B-12,) 1000 MCG/ML injection, Inject 1000 mcg once per month.  Current Outpatient Medications (Other):    b complex vitamins tablet, Take 1 tablet by mouth daily.   Cholecalciferol 125 MCG (5000 UT) TABS,    Coenzyme Q10 (COQ10 PO), Take 100 mg by mouth 2 (two) times daily.   cyclobenzaprine (FLEXERIL) 10 MG tablet, Take 1 tablet (10 mg total) by mouth 3 (three) times daily as needed for muscle spasms.   escitalopram (LEXAPRO) 5 MG tablet, Take 1.5 tablets (7.5 mg total) by mouth daily.   estradiol (ESTRACE) 0.1 MG/GM vaginal cream, Insert 2 grams per vagina daily for 2 weeks, then every 2-3 days for 1 week, then 1x/week for a week, then prn.   influenza vac split quadrivalent PF (FLUARIX) 0.5 ML injection, Inject into the muscle.   Omega-3 Fatty Acids (FISH OIL) 1000 MG CAPS, Take 1,000 mg by mouth 3 (three) times daily.   Probiotic Product (PROBIOTIC DAILY PO), Take by mouth daily.   SYRINGE-NEEDLE, DISP, 3 ML (B-D 3CC LUER-LOK SYR 25GX1") 25G X 1" 3 ML MISC, Use  with Vitamin B12 injection once a month   Turmeric 500 MG TABS, Take 500 mg by mouth daily.   Zoster Vaccine Adjuvanted [facility]) injection,    Cholecalciferol (VITAMIN D) 2000 UNITS CAPS, Take by mouth daily. (Patient not taking: Reported on 12/21/2021)   Reviewed prior external information including notes and imaging from  primary care provider As well as notes that were available from care everywhere and other healthcare systems.  Past medical history, social, surgical and family history all reviewed in electronic medical record.  [place] pertanent information unless stated  regarding to the chief complaint.   Review of Systems:  [place] headache, visual changes, nausea, vomiting, diarrhea, constipation, dizziness, abdominal pain, skin rash, fevers, chills, night sweats, weight loss, swollen lymph nodes, body aches, joint swelling, chest pain, shortness of breath, mood changes. POSITIVE muscle aches  Objective  Blood pressure 122/82, height 5' 6.5" (1.689 m), weight 132 lb (59.9 kg).   General: Sexton apparent distress alert and oriented x3 mood and affect normal, dressed appropriately.  HEENT: Pupils equal, extraocular movements intact  Respiratory: Patient's speak in full sentences and does not appear short of breath  Cardiovascular: Sexton lower extremity edema, non tender, Sexton erythema  Right elbow does have tenderness to palpation noted over the lateral epicondylar area.  Patient now has good strength noted.  Good grip strength noted.  Limited muscular skeletal ultrasound was performed and interpreted by Hulan Saas, M  Limited ultrasound still shows some hypoechoic changes and some interstitial tearing noted of the common extensor tendon at the insertion on the lateral epicondylar area.  Patient does have neovascularization area noted. Impression: Interval improvement but continued interstitial tearing of the common extensor tendon      Impression and Recommendations:     The above  documentation has been reviewed and is accurate and complete Lyndal Pulley, DO

## 2022-05-01 ENCOUNTER — Other Ambulatory Visit: Payer: Self-pay

## 2022-05-01 ENCOUNTER — Encounter: Payer: Self-pay | Admitting: Family Medicine

## 2022-05-01 ENCOUNTER — Ambulatory Visit: Payer: Self-pay

## 2022-05-01 ENCOUNTER — Ambulatory Visit: Payer: 59 | Admitting: Family Medicine

## 2022-05-01 VITALS — BP 122/82 | Ht 66.5 in | Wt 132.0 lb

## 2022-05-01 DIAGNOSIS — M25521 Pain in right elbow: Secondary | ICD-10-CM | POA: Diagnosis not present

## 2022-05-01 DIAGNOSIS — S56511A Strain of other extensor muscle, fascia and tendon at forearm level, right arm, initial encounter: Secondary | ICD-10-CM | POA: Diagnosis not present

## 2022-05-01 DIAGNOSIS — M216X9 Other acquired deformities of unspecified foot: Secondary | ICD-10-CM

## 2022-05-01 MED ORDER — VITAMIN D (ERGOCALCIFEROL) 1.25 MG (50000 UNIT) PO CAPS
50000.0000 [IU] | ORAL_CAPSULE | ORAL | 0 refills | Status: DC
  Filled 2022-05-01 – 2022-05-02 (×2): qty 4, 28d supply, fill #0
  Filled 2022-05-15 – 2022-05-24 (×3): qty 4, 28d supply, fill #1

## 2022-05-01 NOTE — Patient Instructions (Signed)
You are amazing No overhand lifting Change the position of the counterforce brace  We will refer you to the fellowship for orthotics  See me again in 6-8 weeks

## 2022-05-01 NOTE — Assessment & Plan Note (Signed)
Patient is making some improvement but still has some partial tearing noted.  Some scar tissue formation noted in the area.  Discussed the possibility of the PRP.  Discussed with patient to continue to avoid overhand lifting.  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  Follow-up with me again in 6 to 8 weeks

## 2022-05-02 ENCOUNTER — Other Ambulatory Visit: Payer: Self-pay

## 2022-05-02 ENCOUNTER — Other Ambulatory Visit (HOSPITAL_BASED_OUTPATIENT_CLINIC_OR_DEPARTMENT_OTHER): Payer: Self-pay

## 2022-05-04 ENCOUNTER — Ambulatory Visit
Admission: RE | Admit: 2022-05-04 | Discharge: 2022-05-04 | Disposition: A | Discharge status: Home / Self Care | Payer: 59 | Source: Ambulatory Visit | Attending: Obstetrics and Gynecology | Admitting: Obstetrics and Gynecology

## 2022-05-04 DIAGNOSIS — Z1231 Encounter for screening mammogram for malignant neoplasm of breast: Secondary | ICD-10-CM

## 2022-05-07 ENCOUNTER — Ambulatory Visit (INDEPENDENT_AMBULATORY_CARE_PROVIDER_SITE_OTHER): Payer: 59 | Admitting: Internal Medicine

## 2022-05-07 ENCOUNTER — Encounter: Payer: Self-pay | Admitting: Internal Medicine

## 2022-05-07 ENCOUNTER — Other Ambulatory Visit (HOSPITAL_BASED_OUTPATIENT_CLINIC_OR_DEPARTMENT_OTHER): Payer: Self-pay

## 2022-05-07 VITALS — BP 124/74 | HR 75 | Temp 98.0°F | Ht 66.5 in | Wt 137.0 lb

## 2022-05-07 DIAGNOSIS — G500 Trigeminal neuralgia: Secondary | ICD-10-CM

## 2022-05-07 DIAGNOSIS — R3989 Other symptoms and signs involving the genitourinary system: Secondary | ICD-10-CM

## 2022-05-07 DIAGNOSIS — J321 Chronic frontal sinusitis: Secondary | ICD-10-CM

## 2022-05-07 LAB — POCT URINALYSIS DIPSTICK
Bilirubin, UA: NEGATIVE
Blood, UA: NEGATIVE
Glucose, UA: NEGATIVE
Ketones, UA: NEGATIVE
Leukocytes, UA: NEGATIVE
Nitrite, UA: NEGATIVE
Protein, UA: NEGATIVE
Spec Grav, UA: 1.01 (ref 1.010–1.025)
Urobilinogen, UA: 0.2 E.U./dL
pH, UA: 6 (ref 5.0–8.0)

## 2022-05-07 MED ORDER — AMOXICILLIN-POT CLAVULANATE 875-125 MG PO TABS
1.0000 | ORAL_TABLET | Freq: Two times a day (BID) | ORAL | 0 refills | Status: DC
  Filled 2022-05-07: qty 20, 10d supply, fill #0

## 2022-05-07 MED ORDER — OXCARBAZEPINE 150 MG PO TABS
150.0000 mg | ORAL_TABLET | Freq: Two times a day (BID) | ORAL | 2 refills | Status: DC
  Filled 2022-05-07 – 2022-05-08 (×2): qty 60, 30d supply, fill #0

## 2022-05-07 MED ORDER — SIMPLY SALINE 0.9 % NA AERS
2.0000 | INHALATION_SPRAY | Freq: Every day | NASAL | 1 refills | Status: AC | PRN
  Filled 2022-05-07: qty 500, fill #0

## 2022-05-07 MED ORDER — FLUTICASONE PROPIONATE 50 MCG/ACT NA SUSP
2.0000 | Freq: Every day | NASAL | 6 refills | Status: DC
  Filled 2022-05-07: qty 16, 30d supply, fill #0
  Filled 2022-07-20: qty 16, 30d supply, fill #1

## 2022-05-07 MED ORDER — PHENAZOPYRIDINE HCL 100 MG PO TABS
100.0000 mg | ORAL_TABLET | Freq: Three times a day (TID) | ORAL | 0 refills | Status: DC | PRN
  Filled 2022-05-07: qty 10, 4d supply, fill #0

## 2022-05-07 NOTE — Progress Notes (Unsigned)
[name] [place]: [phone]   Routine Medical Office Visit  Patient:  [name]      Age: 62 y.o.       Sex:  female  Date:   05/08/2022  PCP:    [name], [place] Provider: Loralee Pacas, MD  Assessment/Plan:   PROBLEM-FOCUSED CHARTING   Based on simultaneous computerized chart review and medical interview, the problem list overview sections were updated to provide updated problem history alongside the corresponding assessment and plan, as follows:  [name] was seen today for possible sinus infection, fatigue, facial pain, pelvic pain and rhinorrhea.  Trigeminal neuralgia of right side of face Overview: Mom also had this. R sid eof face near eye.  Since late 18s Has tried Lyrica (but nausea), topiramate, baclofen, ibuprofen, steroids, botox, trigeminal nerve block - none helped Herbal treatments  Has not tried humidity adjustments but its not affected by shower but season changes Has noticed high pressure chewing or brushing relieves pain and feels good.   Assessment & Plan: Today's symptom(s) were documented in the overview I theorized the problem is related with her hearing aids compressing the trigeminal nerve and showed images of the anatomic relationship Discussed doing trial of as needed oxcarbazepine she hasn't tried. Also recommended discuss with care team about possibly doing hearing aid free or alternative trial  Orders: -     OXcarbazepine; Take 1 tablet (150 mg total) by mouth 2 (two) times daily.  Dispense: 60 tablet; Refill: 2  Bladder pain -     POCT urinalysis dipstick -     Phenazopyridine HCl; Take 1 tablet (100 mg total) by mouth 3 (three) times daily as needed for pain.  Dispense: 10 tablet; Refill: 0  Chronic frontal sinusitis -     Fluticasone Propionate; Place 2 sprays into both nostrils daily.  Dispense: 16 g; Refill: 6 -     Simply Saline; Place 2 sprays each into the nose daily as needed.   Dispense: 500 mL; Refill: 1 -     Amoxicillin-Pot Clavulanate; Take 1 tablet by mouth 2 (two) times daily.  Dispense: 20 tablet; Refill: 0       Subjective:   [name] is a 62 y.o. female with the following chart data reviewed: Patient Active Problem List   Diagnosis Date Noted   Partial tear of common extensor tendon of right elbow 03/14/2022   Menopausal syndrome 12/21/2021   Malignant tumor of breast ([place]) 12/21/2021   Dyspareunia in female 11/22/2021   Migraine without aura and without status migrainosus, not intractable 10/24/2021   History of total hysterectomy 10/14/2019   Seborrheic dermatitis 08/11/2019   Malignant neoplasm of skin 08/11/2019   Breast lump 08/11/2019   Drug-induced anaphylaxis 07/09/2019   Allergy to hymenoptera venom 07/09/2019   History of urticaria 07/09/2019   Lipoma 02/12/2019   Aortic aneurysm ([place]) 12/11/2017   Bee sting-induced anaphylaxis 10/11/2017   Vitamin D deficiency 10/11/2017   Trigeminal neuralgia of right side of face 09/30/2016   Migraine with aura and without status migrainosus 09/30/2016   Abnormal MRA, brain 05/15/2016   Sensorineural hearing loss (SNHL) of right ear 05/15/2016   Aortic root aneurysm ([place]) 05/03/2016   Dyspnea 12/14/2014   Other allergic rhinitis 12/14/2014   Genetic testing 12/08/2014   History of right breast cancer 08/23/2014   Loss of transverse plantar arch 07/16/2014   Hyperlipidemia 11/28/2010   Hereditary and idiopathic peripheral neuropathy 01/10/2010   Tinnitus  01/10/2010   Hearing loss 01/10/2010   Vitamin B12 deficiency 01/09/2010   Fatigue 07/14/2008   History of skin cancer 05/26/2007   Outpatient Medications Prior to Visit  Medication Sig   b complex vitamins tablet Take 1 tablet by mouth daily.   Coenzyme Q10 (COQ10 PO) Take 100 mg by mouth 2 (two) times daily.   cyanocobalamin (,VITAMIN B-12,) 1000 MCG/ML injection Inject 1000 mcg once per month.   cyclobenzaprine (FLEXERIL)  10 MG tablet Take 1 tablet (10 mg total) by mouth 3 (three) times daily as needed for muscle spasms.   EPINEPHrine 0.3 mg/0.3 mL IJ SOAJ injection INJECT 1 PEN INTO THE MUSCLE FOR ONE DOSE FOR BEE STING ALLERGY AS DIRECTED   escitalopram (LEXAPRO) 5 MG tablet Take 1.5 tablets (7.5 mg total) by mouth daily.   estradiol (ESTRACE) 0.1 MG/GM vaginal cream Insert 2 grams per vagina daily for 2 weeks, then every 2-3 days for 1 week, then 1x/week for a week, then prn.   HYDROcodone-acetaminophen (NORCO/VICODIN) 5-325 MG tablet Take 1 tablet by mouth every 4 (four) hours as needed for moderate pain.   influenza vac split quadrivalent PF (FLUARIX) 0.5 ML injection Inject into the muscle.   Omega-3 Fatty Acids (FISH OIL) 1000 MG CAPS Take 1,000 mg by mouth 3 (three) times daily.   Probiotic Product (PROBIOTIC DAILY PO) Take by mouth daily.   SYRINGE-NEEDLE, DISP, 3 ML (B-D 3CC LUER-LOK SYR 25GX1") 25G X 1" 3 ML MISC Use with Vitamin B12 injection once a month   Turmeric 500 MG TABS Take 500 mg by mouth daily.   Vitamin D, Ergocalciferol, (DRISDOL) 1.25 MG (50000 UNIT) CAPS capsule Take 1 capsule (50,000 Units total) by mouth every 7 (seven) days.   Zoster Vaccine Adjuvanted [facility]) injection    [DISCONTINUED] Cholecalciferol (VITAMIN D) 2000 UNITS CAPS Take by mouth daily. (Patient not taking: Reported on 12/21/2021)   [DISCONTINUED] Cholecalciferol 125 MCG (5000 UT) TABS    [DISCONTINUED] predniSONE (DELTASONE) 20 MG tablet Take 2 pills for 3 days, 1 pill for 4 days   No facility-administered medications prior to visit.       HISTORY OF PRESENT ILLNESS   Chief Complaint  Patient presents with   Possible sinus infection    Started 04/27/22-has done at home remedies with no resolution.   Fatigue   Facial Pain    Around eyes, ear canal down to teeth.   Pelvic Pain   rhinorrhea    HPI  Migraines/trigeminal neuralgia managed with vicodin or flexeril from neurology when regular medications don't  work Feels like its coming down mandibular nerve into molar on right then sinuses ear canal and teeth and she started to suspect sinus infection  Has been doing neti pot Started getting pain in ear with blowing nose Has hearing loss so had to remove the hearing aid due to ear canal sensitivity  Tried nasal spray-  Feels like she has had sinus congestion and ear pain issues for about 10 days and thinks pain in face maybe being flared by sinus infection. Has been using himalyaan salt and peppermint nasal inhaler. 2 weeks ago noted bladder pressure- drinks lots of fluid, ? Suprapuic tenderness. Relief from peppermint oil applied to the area. UA negative  See problem "overview:" in assessment and plan section for most of the today's physician guided interview updates .        Objective:  Physical Exam  BP 124/74 (BP Location: Left Arm, Patient Position: Sitting)   Pulse 75  Temp 98 F (36.7 C) (Temporal)   Ht 5' 6.5" (1.689 m)   Wt 137 lb (62.1 kg)   SpO2 97%   BMI 21.78 kg/m  Wt Readings from Last 10 Encounters:  05/07/22 137 lb (62.1 kg)  05/01/22 132 lb (59.9 kg)  03/14/22 135 lb (61.2 kg)  12/21/21 133 lb 6.4 oz (60.5 kg)  11/22/21 132 lb 6.4 oz (60.1 kg)  10/23/21 133 lb 3.2 oz (60.4 kg)  05/31/21 134 lb (60.8 kg)  03/30/21 131 lb 12.8 oz (59.8 kg)  01/26/21 134 lb 6.4 oz (61 kg)  10/18/20 136 lb 12.8 oz (62.1 kg)   Today's vital signs reviewed.  Nursing notes reviewed. Weight trend reviewed. General Appearance/Constitutional:  Well developed, well nourished female.  She is in no acute distress. Musculoskeletal: All extremities are intact.  Neurological:  Awake, alert,  No obvious focal neurological deficits or cognitive impairments Psychiatric:  Appropriate mood, pleasant demeanor Problem-specific findings:  congested, sniffling, exquisite tenderness trigeminal R face v2/3   Results Reviewed: Results for orders placed or performed in visit on 05/07/22  POCT  Urinalysis Dipstick  Result Value Ref Range   Color, UA yellow    Clarity, UA clear    Glucose, UA Negative Negative   Bilirubin, UA Negative    Ketones, UA Negative    Spec Grav, UA 1.010 1.010 - 1.025   Blood, UA Negative    pH, UA 6.0 5.0 - 8.0   Protein, UA Negative Negative   Urobilinogen, UA 0.2 0.2 or 1.0 E.U./dL   Nitrite, UA Negative    Leukocytes, UA Negative Negative   Appearance     Odor       Recent Results (from the past 2160 hour(s))  POCT Urinalysis Dipstick     Status: Normal   Collection Time: 05/07/22  5:30 PM  Result Value Ref Range   Color, UA yellow    Clarity, UA clear    Glucose, UA Negative Negative   Bilirubin, UA Negative    Ketones, UA Negative    Spec Grav, UA 1.010 1.010 - 1.025   Blood, UA Negative    pH, UA 6.0 5.0 - 8.0   Protein, UA Negative Negative   Urobilinogen, UA 0.2 0.2 or 1.0 E.U./dL   Nitrite, UA Negative    Leukocytes, UA Negative Negative   Appearance     Odor       See assessment and plan section for additional chart review elements completed today.  Mary Pacas, MD

## 2022-05-07 NOTE — Patient Instructions (Signed)
This shows  how the eustachian canal works to drain the inner ear and how it is connected to the nasopharynx.  Nasal sinus rinses with saline nasal mist sprays can rinse all of the pollen and allergens and other irritants and pathogens out of his sinuses and nasopharynx, reducing the plugging/swelling around the eustachian tube.  The sinus rinse clears away the mucus and allows nasal steroid sprays to help reach the eustachian tube and reduce swellling to open it up.    Basic Sinus Care: Mist each nostril nightly with sterile saline nasal mist, then spray each nostril immediately after with fluticasone nasal spray (Flonase) or other steroid or antihistamine nasal pray Next, if symptoms persist, add a daily allergy pill.  Benadryl is the strongest but will make you very drowsy so only take when you can sleep after.  Ear Pressure Relief Maneuvers: Step 1 If the congestion is mild, you can often use simple maneuvers to quickly alter the pressure in your middle ear, such as: Swallowing Yawning Chewing gum Sucking on hard candy Similar methods can be used on children. If traveling with an infant or toddler, try giving them a bottle, pacifier, or something to drink or suck on.  Warm compress: Applying a warm, moist cloth to the back of your ear can help reduce swelling and help drain congested passages. In some cases, these interventions will cause the ears will pop without trying. If they don't, give it 20 minutes and see if swallowing, yawning, chewing gum, or sucking on hard candy helps.  Step 2 If these methods alone don't help, you can try other interventions like:  Decongestants: OTC drugs like Afrin (oxymetazoline) or Sudafed (pseudoephedrine) work by reducing the swelling of blood vessels in the nasal passages and Eustachian tubes.  Never use these medications for more than a few days at a time, especially afrin is dependency-forming  If this isn't working or you need more than a few days  of afrin or sudafed... you should make an appointment.   Step 3 (just for mod severe ear pressure and pain) If these interventions don't help, there are three other advanced strategies you can try called the Valsalva maneuver, the Toynbee maneuver, and the Frenzel maneuver.  Advanced Strategy 1:  The Valsalva maneuver- prefer you avoid due to risk of eardrum rupture do not blow out with nose closed.  Advanced Strategy 2:  The Toynbee maneuver The Toynbee maneuver may also be safer than the Valsalva maneuver if you've had a previous eardrum injury. The Valsalva method exerts much more pressure on the eardrum and can possibly cause a rupture if you blow too forcefully.  Keep your mouth tightly shut. Pinch your nose shut with your fingers. Swallow hard.  Advanced Strategy 3: The Frenzel maneuver Pinch your nose shut with your fingers Close your mouth and place the tip of your tongue behind your upper front teeth. Push the back of your tongue to the roof of your mouth as if making a hard "G" or "K" sound. The back of your tongue will touch the roof. While doing this, close your vocal folds at the back of your throat and lift your larynx (voice box) up to push the air out of your mouth and into your nose.  ------------------------------------------------------------------------ If all this fails despite extensive efforts then you need to go to an ear nose and throat for surgical correction - but this should not be tried until everything else fails.

## 2022-05-07 NOTE — Assessment & Plan Note (Signed)
Today's symptom(s) were documented in the overview I theorized the problem is related with her hearing aids compressing the trigeminal nerve and showed images of the anatomic relationship Discussed doing trial of as needed oxcarbazepine she hasn't tried. Also recommended discuss with care team about possibly doing hearing aid free or alternative trial

## 2022-05-08 ENCOUNTER — Other Ambulatory Visit (HOSPITAL_BASED_OUTPATIENT_CLINIC_OR_DEPARTMENT_OTHER): Payer: Self-pay

## 2022-05-08 MED ORDER — AMOXICILLIN-POT CLAVULANATE 875-125 MG PO TABS
1.0000 | ORAL_TABLET | Freq: Two times a day (BID) | ORAL | 0 refills | Status: DC
  Filled 2022-05-08: qty 20, 10d supply, fill #0

## 2022-05-09 ENCOUNTER — Other Ambulatory Visit (HOSPITAL_BASED_OUTPATIENT_CLINIC_OR_DEPARTMENT_OTHER): Payer: Self-pay

## 2022-05-15 ENCOUNTER — Other Ambulatory Visit (HOSPITAL_BASED_OUTPATIENT_CLINIC_OR_DEPARTMENT_OTHER): Payer: Self-pay

## 2022-05-15 ENCOUNTER — Encounter (HOSPITAL_BASED_OUTPATIENT_CLINIC_OR_DEPARTMENT_OTHER): Payer: Self-pay | Admitting: Pharmacist

## 2022-05-21 ENCOUNTER — Other Ambulatory Visit (HOSPITAL_COMMUNITY): Payer: Self-pay

## 2022-05-22 ENCOUNTER — Encounter: Payer: Self-pay | Admitting: Neurology

## 2022-05-24 ENCOUNTER — Other Ambulatory Visit (HOSPITAL_BASED_OUTPATIENT_CLINIC_OR_DEPARTMENT_OTHER): Payer: Self-pay

## 2022-05-24 ENCOUNTER — Other Ambulatory Visit: Payer: Self-pay

## 2022-05-24 MED ORDER — GABAPENTIN 100 MG PO CAPS
100.0000 mg | ORAL_CAPSULE | Freq: Three times a day (TID) | ORAL | 2 refills | Status: DC
  Filled 2022-05-24: qty 90, 30d supply, fill #0
  Filled 2022-09-10: qty 90, 30d supply, fill #1
  Filled 2023-03-15: qty 90, 30d supply, fill #2

## 2022-05-24 NOTE — Telephone Encounter (Signed)
Called and spoke to [place] at [facility] for the Lidocaine 4% NS use one spray to affected nostril every 15 min (up to 6 times daily as needed). 30 ml.  Verbal order from Dr. Jaynee Eagles. Order for gabapentin to be escribed to pts pharmacy (pt to let us know which one).

## 2022-05-29 ENCOUNTER — Encounter: Payer: 59 | Admitting: Sports Medicine

## 2022-06-06 ENCOUNTER — Encounter: Payer: Self-pay | Admitting: Neurology

## 2022-06-11 NOTE — Progress Notes (Unsigned)
[place] Phone: [phone] Subjective:   [name], am serving as a scribe for Dr. Hulan Saas.  I'm seeing this patient by the request  of:  Marin Olp, MD  CC: right elbow pain follow up   [id number]  05/01/2022 Patient is making some improvement but still has some partial tearing noted.  Some scar tissue formation noted in the area.  Discussed the possibility of the PRP.  Discussed with patient to continue to avoid overhand lifting.  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  Follow-up with me again in 6 to 8 weeks      Update 06/12/2022 [name] is a 62 y.o. female coming in with complaint of R elbow pain. Patient states that she does still get some pain in the elbow from time to time. Overall is much better. Uses brace when she has pain.   Still using Once weekly Vit D. Has 2 doses remaining.       Past Medical History:  Diagnosis Date   Adhesive capsulitis of shoulder    Anxiety    Aortic aneurysm ([place])    Breast cancer ([place])    Cancer ([place]) 2008   Breast   Congenital anomaly of aortic arch    Labyrinthitis, unspecified    Malignant neoplasm of breast (female), unspecified site    Other B-complex deficiencies    Other malaise and fatigue    Personal history of chemotherapy    Personal history of radiation therapy 2008   Unspecified hearing loss    Unspecified hereditary and idiopathic peripheral neuropathy    Unspecified tinnitus    Past Surgical History:  Procedure Laterality Date   ABDOMINAL HYSTERECTOMY  04/2010   robotic   BASAL CELL CARCINOMA EXCISION  2003   rt eye area   [place]   Left   BREAST LUMPECTOMY Right Oct.29 & Mar 19, 2007   Right   IR GENERIC HISTORICAL  04/16/2016   IR RADIOLOGIST EVAL & MGMT 04/16/2016 [facility]   LAPAROSCOPY  1982   for endometriosis    wisdom teeth extracted     Social History   Socioeconomic History   Marital status: Married    Spouse name: Not on file   Number of children: 0   Years of education: 12   Highest education level: Not on file  Occupational History   Occupation: HAIR STYLIST    Employer: [employer]   Occupation: vocalist   Occupation: model  Tobacco Use   Smoking status: Never   Smokeless tobacco: Never  Vaping Use   Vaping Use: Never used  Substance and Sexual Activity   Alcohol use: Yes    Comment: wine with dinner a few nights a week   Drug use: No   Sexual activity: Yes  Other Topics Concern   Not on file  Social History Narrative   Married      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 Married '85, no children.    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 Strong interest in herbology and alternative healing.    Right-handed   Social Determinants of Health   Financial Resource Strain: Not on file  Food Insecurity: Not on file  Transportation Needs: Not on file  Physical Activity: Not on file  Stress: Not on file  Social Connections: Not on file   Allergies  Allergen Reactions   Bee Venom Anaphylaxis   Methylprednisolone Other (See Comments) and Anaphylaxis    Injection only - has polysorbate 80 preservative   Other     kerajinan   Polysorbate Anaphylaxis    "Polysorbate 80 preservative"   Latex Hives   Lyrica [Pregabalin] Nausea And Vomiting and Other (See Comments)    Dizziness    Miralax [Polyethylene Glycol] Hives    pruritus   Doxycycline Hyclate Rash   Family History  Problem Relation Age of Onset   Seizures Mother    Migraines Mother    Allergic rhinitis Mother    Diabetes Father    Hypertension Father    Stroke Father        several - first stroke late 76s and died 69   Hyperlipidemia Father    Colon polyps  Sister    Breast cancer Sister    Cancer Sister        breast   Breast cancer Maternal Grandmother    Cancer Other        breast     Current Outpatient Medications (Cardiovascular):    EPINEPHrine 0.3 mg/0.3 mL IJ SOAJ injection, INJECT 1 PEN INTO THE MUSCLE FOR ONE DOSE FOR BEE STING ALLERGY AS DIRECTED  Current Outpatient Medications (Respiratory):    beclomethasone (QVAR REDIHALER) 40 MCG/ACT inhaler, Inhale 1 puff into the lungs 2 (two) times daily.   fluticasone (FLONASE) 50 MCG/ACT nasal spray, Place 2 sprays into both nostrils daily.   Saline (SIMPLY SALINE) 0.9 % AERS, Place 2 sprays each into the nose daily as needed.  Current Outpatient Medications (Analgesics):    HYDROcodone-acetaminophen (NORCO/VICODIN) 5-325 MG tablet, Take 1 tablet by mouth every 4 (four) hours as needed for moderate pain.  Current Outpatient Medications (Hematological):    cyanocobalamin (,VITAMIN B-12,) 1000 MCG/ML injection, Inject 1000 mcg once per month.  Current Outpatient Medications (Other):    b complex vitamins tablet, Take 1 tablet by mouth daily.   Coenzyme Q10 (COQ10 PO), Take 100 mg by mouth 2 (two) times daily.   cyclobenzaprine (FLEXERIL) 10 MG tablet, Take 1 tablet (10 mg total) by mouth 3 (three) times daily as needed for muscle spasms.   escitalopram (LEXAPRO) 5 MG tablet, Take 1.5 tablets (7.5 mg total) by mouth daily.   estradiol (ESTRACE) 0.1 MG/GM vaginal cream, Insert 2 grams per vagina daily for 2 weeks, then every 2-3 days for 1 week, then 1x/week for a week, then prn.   gabapentin (NEURONTIN) 100 MG capsule, Take 1 capsule (100 mg total) by mouth 3 (three) times daily.   influenza vac split quadrivalent PF (FLUARIX) 0.5 ML injection, Inject into the muscle.   Omega-3 Fatty Acids (FISH OIL) 1000 MG CAPS, Take 1,000 mg by mouth 3 (three) times daily.   phenazopyridine (PYRIDIUM) 100 MG tablet, Take 1 tablet (100 mg total) by mouth 3 (three) times daily as needed for pain.    Probiotic Product (PROBIOTIC DAILY PO), Take by mouth daily.   SYRINGE-NEEDLE, DISP, 3 ML (B-D 3CC LUER-LOK SYR 25GX1") 25G X 1" 3 ML MISC, Use with Vitamin B12 injection once a month   Turmeric 500 MG TABS, Take 500 mg by mouth daily.   Vitamin D, Ergocalciferol, (DRISDOL) 1.25 MG (50000 UNIT) CAPS capsule, Take 1 capsule (50,000 Units total) by mouth every 7 (seven) days.   Zoster Vaccine Adjuvanted [facility]) injection,  Reviewed prior external information including notes and imaging from  primary care provider As well as notes that were available from care everywhere and other healthcare systems.  Past medical history, social, surgical and family history all reviewed in electronic medical record.  No pertanent information unless stated regarding to the chief complaint.   Review of Systems:  No headache, visual changes, nausea, vomiting, diarrhea, constipation, dizziness, abdominal pain, skin rash, fevers, chills, night sweats, weight loss, swollen lymph nodes, body aches, joint swelling, chest pain,  mood changes. POSITIVE muscle aches mild dyspnea on activity only she states.  No chest pain  Objective  Blood pressure 124/88, pulse 72, height 5' 6.5" (1.689 m), weight 136 lb (61.7 kg), SpO2 99 %.   General: No apparent distress alert and oriented x3 mood and affect normal, dressed appropriately.  HEENT: Pupils equal, extraocular movements intact  Respiratory: Patient's speak in full sentences and does not appear short of breath  Cardiovascular: No lower extremity edema, non tender, no erythema  Patient's right elbow still has some tenderness to palpation but significant decrease in the swelling in that area.  Minimal discomfort noted with resistance of extension of the wrist against resistance.  Limited muscular skeletal ultrasound was performed and interpreted by Hulan Saas, M  Limited ultrasound does show that patient does have some significant increase in neovascularization  over the tendon itself.  Cortical irregularity that was noted does have some callus formation noted.  Questionable new tendon is noted in the area. Impression: Interval improvement with increasing in neovascularization    Impression and Recommendations:     The above documentation has been reviewed and is accurate and complete Lyndal Pulley, DO

## 2022-06-12 ENCOUNTER — Ambulatory Visit: Payer: 59 | Admitting: Family Medicine

## 2022-06-12 ENCOUNTER — Other Ambulatory Visit (HOSPITAL_BASED_OUTPATIENT_CLINIC_OR_DEPARTMENT_OTHER): Payer: Self-pay

## 2022-06-12 ENCOUNTER — Other Ambulatory Visit: Payer: Self-pay

## 2022-06-12 ENCOUNTER — Ambulatory Visit: Payer: Self-pay

## 2022-06-12 VITALS — BP 124/88 | HR 72 | Ht 66.5 in | Wt 136.0 lb

## 2022-06-12 DIAGNOSIS — M25521 Pain in right elbow: Secondary | ICD-10-CM | POA: Diagnosis not present

## 2022-06-12 DIAGNOSIS — S56511A Strain of other extensor muscle, fascia and tendon at forearm level, right arm, initial encounter: Secondary | ICD-10-CM | POA: Diagnosis not present

## 2022-06-12 DIAGNOSIS — R0600 Dyspnea, unspecified: Secondary | ICD-10-CM | POA: Diagnosis not present

## 2022-06-12 MED ORDER — QVAR REDIHALER 40 MCG/ACT IN AERB
1.0000 | INHALATION_SPRAY | Freq: Two times a day (BID) | RESPIRATORY_TRACT | 3 refills | Status: DC
  Filled 2022-06-12: qty 10.6, 30d supply, fill #0

## 2022-06-12 MED ORDER — VITAMIN D (ERGOCALCIFEROL) 1.25 MG (50000 UNIT) PO CAPS
50000.0000 [IU] | ORAL_CAPSULE | ORAL | 0 refills | Status: DC
  Filled 2022-06-12 – 2022-06-15 (×4): qty 4, 28d supply, fill #0
  Filled 2022-07-20 (×2): qty 4, 28d supply, fill #1
  Filled 2022-08-14: qty 4, 28d supply, fill #2

## 2022-06-12 NOTE — Patient Instructions (Addendum)
Refilled Vit D Qvar inhaler See me again in 8 weeks

## 2022-06-12 NOTE — Assessment & Plan Note (Signed)
Patient has had some difficulty previously.  Has had PFT showing that patient did have some mild airway obstruction and will start Qvar.  Patient has had a CT angio of the chest done as well that was unremarkable.  We discussed with patient that if this continues I do feel advanced imaging would be warranted but hopefully patient does well.  Patient is in agreement with the plan

## 2022-06-12 NOTE — Assessment & Plan Note (Addendum)
Some improvement noted, discussed HEP  Discussed continuing to use the brace at night, icing regimen, home exercises which activities to do F/u in 6 weeks .  Continue vitamin supplementation and continue the once weekly vitamin D.

## 2022-06-15 ENCOUNTER — Other Ambulatory Visit: Payer: Self-pay

## 2022-06-15 ENCOUNTER — Other Ambulatory Visit (HOSPITAL_BASED_OUTPATIENT_CLINIC_OR_DEPARTMENT_OTHER): Payer: Self-pay

## 2022-06-25 ENCOUNTER — Telehealth: Payer: Self-pay | Admitting: Pulmonary Disease

## 2022-06-25 ENCOUNTER — Encounter: Payer: Self-pay | Admitting: Family Medicine

## 2022-06-25 NOTE — Telephone Encounter (Signed)
PT calling for FU appt. Dr. On AVS notes stated:    CT Chest Wo Contrast   Please call to sched before appt I am going to sched for her now.   (PT said something about [name]. She has a breast cancer history. I am not 100% sure if the CT request is related to the Lung Cancer Screening or not. Please review chart and arrange w/Ms. Groce if this is the case. The AVS says nothing about this. She said her [facility] references [name].)

## 2022-06-26 NOTE — Telephone Encounter (Signed)
Pt scheduled for a follow up with Dr. Valeta Harms 3/11. At her last OV with Dr. Valeta Harms 11/22/21, Dr. Valeta Harms stated for pt to return in about 9 months after having a CT scan. Order has been placed for that scan.  Routing to [facility] so they can take care of scheduling CT prior to upcoming appt.

## 2022-06-27 NOTE — Telephone Encounter (Signed)
ATC x2, she did not answer. Will call back later.

## 2022-06-27 NOTE — Telephone Encounter (Signed)
I have pulled the Ct and it states due in April then follow up after are we wanting to move this up it could cause issues with getting the auth for the ct    Please schedule in April 2024 Please schedule follow up with Eric Form, NP after ct chest complete.  This is what the Ct states

## 2022-07-04 ENCOUNTER — Encounter: Payer: Self-pay | Admitting: Pulmonary Disease

## 2022-07-05 NOTE — Telephone Encounter (Signed)
PCC's please advise. Patient has appt with Dr. Valeta Harms 3/11

## 2022-07-05 NOTE — Telephone Encounter (Signed)
Spoke with the pt  She is aware of the response per [facility]  She now states that she would like to change the ct to a cardiac score CT  She says this is bc this is the type of scan she initially had, and she will be charged more for another type of CT  Please advise, thanks!

## 2022-07-05 NOTE — Telephone Encounter (Signed)
Patient is returning phone call. Patient phone number is [phone].Patient scheduled 07/09/2022.

## 2022-07-06 NOTE — Telephone Encounter (Signed)
Garner Nash, DO  Rosana Berger, CMA; [facility] Triage Pool; Magdalen Spatz, NP19 minutes ago (8:14 AM)     Ct type cannot be changed. A cardiac scoring ct only looks at the heart  BLI     Called and spoke with pt letting her know the info per [facility] and she verbalized understanding. Nothing further needed.

## 2022-07-09 ENCOUNTER — Ambulatory Visit: Payer: 59 | Admitting: Pulmonary Disease

## 2022-07-09 NOTE — Telephone Encounter (Signed)
I have spoken to pt and gave her appt info and have given her follow up appt wth BI.  Message can be closed.

## 2022-07-09 NOTE — Telephone Encounter (Signed)
Per AVS pt is to have CT in April and follow up with BI/SG after CT.  I scheduled CT for 4/8 at 1:20 with 1:00 arrival at [facility].  This location will need to be used due to pt's insurance.  Left vm for pt to call me back for appt info and can schedule follow up appt.  Will route message back to triage so pt can be made aware thru [place].

## 2022-07-15 ENCOUNTER — Other Ambulatory Visit (HOSPITAL_BASED_OUTPATIENT_CLINIC_OR_DEPARTMENT_OTHER): Payer: Self-pay

## 2022-07-20 ENCOUNTER — Other Ambulatory Visit: Payer: Self-pay | Admitting: Neurology

## 2022-07-20 ENCOUNTER — Other Ambulatory Visit: Payer: Self-pay

## 2022-07-20 ENCOUNTER — Other Ambulatory Visit (HOSPITAL_BASED_OUTPATIENT_CLINIC_OR_DEPARTMENT_OTHER): Payer: Self-pay

## 2022-07-23 ENCOUNTER — Other Ambulatory Visit (HOSPITAL_BASED_OUTPATIENT_CLINIC_OR_DEPARTMENT_OTHER): Payer: Self-pay

## 2022-07-26 NOTE — Telephone Encounter (Signed)
Controlled substance. Routing to provider to fill.  Dispensed last:   Dispensed Days Supply Quantity Provider Pharmacy  HYDROcodone-acetaminophen (NORCO/VICODIN) 5-325 MG tablet 02/27/2022 10 60 tablet Melvenia Beam, MD [facility]...    Last seen by Dr. Jaynee Eagles 02/26/22

## 2022-07-27 ENCOUNTER — Other Ambulatory Visit (HOSPITAL_BASED_OUTPATIENT_CLINIC_OR_DEPARTMENT_OTHER): Payer: Self-pay

## 2022-07-29 ENCOUNTER — Other Ambulatory Visit (HOSPITAL_BASED_OUTPATIENT_CLINIC_OR_DEPARTMENT_OTHER): Payer: Self-pay

## 2022-07-29 MED ORDER — HYDROCODONE-ACETAMINOPHEN 5-325 MG PO TABS
1.0000 | ORAL_TABLET | ORAL | 0 refills | Status: DC | PRN
  Filled 2022-07-29: qty 60, 10d supply, fill #0

## 2022-07-30 ENCOUNTER — Other Ambulatory Visit: Payer: Self-pay

## 2022-07-30 ENCOUNTER — Other Ambulatory Visit (HOSPITAL_BASED_OUTPATIENT_CLINIC_OR_DEPARTMENT_OTHER): Payer: Self-pay

## 2022-08-03 NOTE — Progress Notes (Unsigned)
[facility] [address] [place] [address] Phone: [phone] Subjective:   [name], am serving as a scribe for Dr. Antoine Primas.  I'm seeing this patient by the request  of:  Shelva Majestic, MD  CC: Back pain, elbow pain follow-up  [id number]  06/12/2022 Patient has had some difficulty previously.  Has had PFT showing that patient did have some mild airway obstruction and will start Qvar.  Patient has had a CT angio of the chest done as well that was unremarkable.  We discussed with patient that if this continues I do feel advanced imaging would be warranted but hopefully patient does well.  Patient is in agreement with the plan     Some improvement noted, discussed HEP  Discussed continuing to use the brace at night, icing regimen, home exercises which activities to do F/u in 6 weeks .  Continue vitamin supplementation and continue the once weekly vitamin D.      Update 08/07/2022 [name] is a 62 y.o. female coming in with complaint of R elbow pain. Patient states that her elbow is doing well but she will still get some aching at the actual elbow. Has been doing yard work so the pain will be more frequent has been immobilizing it but not like she was doing months ago. Patient states you are following her Vit D and wants to talk about her inhaler she got last time.        Past Medical History:  Diagnosis Date   Adhesive capsulitis of shoulder    Anxiety    Aortic aneurysm ([facility])    Breast cancer ([facility])    Cancer ([facility]) 2008   Breast   Congenital anomaly of aortic arch    Labyrinthitis, unspecified    Malignant neoplasm of breast (female), unspecified site    Other B-complex deficiencies    Other malaise and fatigue    Personal history of chemotherapy    Personal history of radiation therapy 2008   Unspecified hearing loss    Unspecified hereditary and idiopathic peripheral neuropathy    Unspecified tinnitus     Past Surgical History:  Procedure Laterality Date   ABDOMINAL HYSTERECTOMY  04/2010   robotic   BASAL CELL CARCINOMA EXCISION  2003   rt eye area   BREAST FIBROADENOMA SURGERY  1981   Left   BREAST LUMPECTOMY Right Oct.29 & Mar 19, 2007   Right   IR GENERIC HISTORICAL  04/16/2016   IR RADIOLOGIST EVAL & MGMT 04/16/2016 [facility]   LAPAROSCOPY  1982   for endometriosis   wisdom teeth extracted     Social History   Socioeconomic History   Marital status: Married    Spouse name: Not on file   Number of children: 0   Years of education: 12   Highest education level: Not on file  Occupational History   Occupation: HAIR STYLIST    Employer: [employer]   Occupation: vocalist   Occupation: model  Tobacco Use   Smoking status: Never   Smokeless tobacco: Never  Vaping Use   Vaping Use: Never used  Substance and Sexual Activity   Alcohol use: Yes    Comment: wine with dinner a few nights a week   Drug use: No   Sexual activity: Yes  Other Topics Concern   Not on file  Social History Narrative   Married      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 Married '85, no children.    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 Strong interest in herbology and alternative healing.    Right-handed   Social Determinants of Corporate investment banker Strain: Not on file  Food Insecurity: Not on file  Transportation Needs: Not on file  Physical Activity: Not on file  Stress: Not on file  Social Connections: Not on file   Allergies  Allergen Reactions   Bee Venom Anaphylaxis   Methylprednisolone Other (See Comments) and Anaphylaxis    Injection only - has polysorbate 80 preservative   Other     kerajinan   Polysorbate Anaphylaxis    "Polysorbate 80 preservative"   Latex Hives   Lyrica [Pregabalin] Nausea And Vomiting and  Other (See Comments)    Dizziness    Miralax [Polyethylene Glycol] Hives    pruritus   Doxycycline Hyclate Rash   Family History  Problem Relation Age of Onset   Seizures Mother    Migraines Mother    Allergic rhinitis Mother    Diabetes Father    Hypertension Father    Stroke Father        several - first stroke late 73s and died 3   Hyperlipidemia Father    Colon polyps Sister    Breast cancer Sister    Cancer Sister        breast   Breast cancer Maternal Grandmother    Cancer Other        breast     Current Outpatient Medications (Cardiovascular):    EPINEPHrine 0.3 mg/0.3 mL IJ SOAJ injection, INJECT 1 PEN INTO THE MUSCLE FOR ONE DOSE FOR BEE STING ALLERGY AS DIRECTED  Current Outpatient Medications (Respiratory):    beclomethasone (QVAR REDIHALER) 40 MCG/ACT inhaler, Inhale 1 puff into the lungs 2 (two) times daily.   fluticasone (FLONASE) 50 MCG/ACT nasal spray, Place 2 sprays into both nostrils daily.   Saline (SIMPLY SALINE) 0.9 % AERS, Place 2 sprays each into the nose daily as needed.  Current Outpatient Medications (Analgesics):    HYDROcodone-acetaminophen (NORCO/VICODIN) 5-325 MG tablet, Take 1 tablet by mouth every 4 (four) hours as needed for moderate pain.  Current Outpatient Medications (Hematological):    cyanocobalamin (,VITAMIN B-12,) 1000 MCG/ML injection, Inject 1000 mcg once per month.  Current Outpatient Medications (Other):    b complex vitamins tablet, Take 1 tablet by mouth daily.   Coenzyme Q10 (COQ10 PO), Take 100 mg by mouth 2 (two) times daily.   cyclobenzaprine (FLEXERIL) 10 MG tablet, Take 1 tablet (10 mg total) by mouth 3 (three) times daily as needed for muscle spasms.   escitalopram (LEXAPRO) 5 MG tablet, Take 1.5 tablets (7.5 mg total) by mouth daily.   estradiol (ESTRACE) 0.1 MG/GM vaginal cream, Insert 2 grams per vagina daily for 2 weeks, then every 2-3 days for 1 week, then 1x/week for a week, then prn.   gabapentin (NEURONTIN)  100 MG capsule, Take 1 capsule (100 mg total) by mouth 3 (three) times daily.   influenza vac split quadrivalent PF (FLUARIX) 0.5 ML injection, Inject into the muscle.   Omega-3 Fatty Acids (FISH OIL) 1000 MG CAPS, Take 1,000 mg by mouth 3 (three) times daily.   phenazopyridine (PYRIDIUM) 100 MG tablet, Take 1 tablet (100 mg total) by mouth 3 (three) times daily as needed for pain.   Probiotic Product (PROBIOTIC DAILY PO),  Take by mouth daily.   SYRINGE-NEEDLE, DISP, 3 ML (B-D 3CC LUER-LOK SYR 25GX1") 25G X 1" 3 ML MISC, Use with Vitamin B12 injection once a month   Turmeric 500 MG TABS, Take 500 mg by mouth daily.   Vitamin D, Ergocalciferol, (DRISDOL) 1.25 MG (50000 UNIT) CAPS capsule, Take 1 capsule (50,000 Units total) by mouth every 7 (seven) days.   Zoster Vaccine Adjuvanted [facility](SHINGRIX) injection,    Reviewed prior external information including notes and imaging from  primary care provider As well as notes that were available from care everywhere and other healthcare systems.  Past medical history, social, surgical and family history all reviewed in electronic medical record.  No pertanent information unless stated regarding to the chief complaint.   Review of Systems:  No headache, visual changes, nausea, vomiting, diarrhea, constipation, dizziness, abdominal pain, skin rash, fevers, chills, night sweats, weight loss, swollen lymph nodes, body aches, joint swelling, chest pain, shortness of breath, mood changes. POSITIVE muscle aches  Objective  Blood pressure 110/60, pulse 87, height 5' 6.5" (1.689 m), weight 137 lb (62.1 kg), SpO2 98 %.   General: No apparent distress alert and oriented x3 mood and affect normal, dressed appropriately.  HEENT: Pupils equal, extraocular movements intact  Respiratory: Patient's speak in full sentences and does not appear short of breath  Cardiovascular: No lower extremity edema, non tender, no erythema  Patient does have tenderness to palpation over  the right elbow still noted.  Tender to palpation in this area.  Good grip strength though noted. Patient's low back does have significant instability noted on the right side.  Also significant tenderness and trigger points noted in the parascapular region on the right side.  Limited muscular skeletal ultrasound was performed and interpreted by Antoine PrimasSMITH, Samyak Sackmann, M  Limited ultrasound shows the patient does have hypoechoic changes seen individually.  Patient does have some increasing in Doppler aspect. No increase tendon hypoechoic changes.  Impression: improvement noted   Osteopathic findings C2 flexed rotated and side bent right C4 flexed rotated and side bent left C6 flexed rotated and side bent right  T2 extended rotated and side bent right inhaled third rib    Impression and Recommendations:    The above documentation has been reviewed and is accurate and complete Judi SaaZachary M Rusti Arizmendi, DO

## 2022-08-06 ENCOUNTER — Ambulatory Visit
Admission: RE | Admit: 2022-08-06 | Discharge: 2022-08-06 | Disposition: A | Discharge status: Home / Self Care | Payer: 59 | Source: Ambulatory Visit | Attending: Pulmonary Disease | Admitting: Pulmonary Disease

## 2022-08-06 DIAGNOSIS — R911 Solitary pulmonary nodule: Secondary | ICD-10-CM | POA: Diagnosis not present

## 2022-08-06 DIAGNOSIS — I700 Atherosclerosis of aorta: Secondary | ICD-10-CM | POA: Diagnosis not present

## 2022-08-06 DIAGNOSIS — R918 Other nonspecific abnormal finding of lung field: Secondary | ICD-10-CM | POA: Diagnosis not present

## 2022-08-07 ENCOUNTER — Other Ambulatory Visit: Payer: Self-pay

## 2022-08-07 ENCOUNTER — Encounter: Payer: Self-pay | Admitting: Family Medicine

## 2022-08-07 ENCOUNTER — Ambulatory Visit: Payer: 59 | Admitting: Family Medicine

## 2022-08-07 VITALS — BP 110/60 | HR 87 | Ht 66.5 in | Wt 137.0 lb

## 2022-08-07 DIAGNOSIS — M9902 Segmental and somatic dysfunction of thoracic region: Secondary | ICD-10-CM

## 2022-08-07 DIAGNOSIS — M9901 Segmental and somatic dysfunction of cervical region: Secondary | ICD-10-CM | POA: Diagnosis not present

## 2022-08-07 DIAGNOSIS — M25521 Pain in right elbow: Secondary | ICD-10-CM | POA: Diagnosis not present

## 2022-08-07 DIAGNOSIS — M9908 Segmental and somatic dysfunction of rib cage: Secondary | ICD-10-CM

## 2022-08-07 DIAGNOSIS — G2589 Other specified extrapyramidal and movement disorders: Secondary | ICD-10-CM | POA: Diagnosis not present

## 2022-08-07 DIAGNOSIS — S56511A Strain of other extensor muscle, fascia and tendon at forearm level, right arm, initial encounter: Secondary | ICD-10-CM

## 2022-08-07 NOTE — Assessment & Plan Note (Addendum)
   Decision today to treat with OMT was based on Physical Exam  After verbal consent patient was treated with HVLA, ME, FPR techniques in cervical, thoracic, rib,  areas, all areas are chronic   Patient tolerated the procedure well with improvement in symptoms  Patient given exercises, stretches and lifestyle modifications  See medications in patient instructions if given  Patient will follow up in 4-8 weeks she

## 2022-08-07 NOTE — Assessment & Plan Note (Signed)
Exercises given today, discussed posture ergonomics, discussed home exercises fairly well to osteopathic manipulation.  Follow-up again in 6 to 8 weeks for further evaluation

## 2022-08-07 NOTE — Assessment & Plan Note (Signed)
Tearing has completely healed at this time but continues to have hypoechoic changes and some increasing in Doppler flow that does still see some chronic tendinopathy.  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  Discussed with patient about icing regimen and home exercises and still avoiding overhead activity.  Patient does not want to do anything more aggressive at this moment but could be a candidate if this continues for PRP.  Follow-up again in 6 to 8 weeks otherwise

## 2022-08-07 NOTE — Patient Instructions (Addendum)
Good to see you  Scapular exercises given   Started manipulation  Follow up in 6 weeks

## 2022-08-13 ENCOUNTER — Encounter: Payer: Self-pay | Admitting: Pulmonary Disease

## 2022-08-13 ENCOUNTER — Ambulatory Visit: Payer: 59 | Admitting: Pulmonary Disease

## 2022-08-13 VITALS — BP 120/74 | HR 72 | Temp 98.2°F | Ht 66.5 in | Wt 138.8 lb

## 2022-08-13 DIAGNOSIS — Z789 Other specified health status: Secondary | ICD-10-CM | POA: Diagnosis not present

## 2022-08-13 DIAGNOSIS — Z853 Personal history of malignant neoplasm of breast: Secondary | ICD-10-CM

## 2022-08-13 DIAGNOSIS — R911 Solitary pulmonary nodule: Secondary | ICD-10-CM | POA: Diagnosis not present

## 2022-08-13 DIAGNOSIS — I7121 Aneurysm of the ascending aorta, without rupture: Secondary | ICD-10-CM | POA: Diagnosis not present

## 2022-08-13 NOTE — Progress Notes (Unsigned)
Synopsis: Referred in July 2023 for lung nodule by Shelva Majestic, MD  Subjective:   PATIENT ID: [name] GENDER: female DOB: January 12, 1961, MRN: [id number]  Chief Complaint  Patient presents with   Follow-up    Overall doing pretty well. Patient states she struggles to get a good deep breath    This is a 62 year old female, past medical history of breast cancer, diagnosed 2008.  History of chemotherapy and radiation related to this.  Lifelong non-smoker except for a few cigarettes at age 6.  Had a coronary calcium scoring CT scan complete which revealed a small 5 mm pulmonary nodule.  When I compare his CT imaging from 2018 until this scan in July 2023 there is a persistent nodule.  It may look now slightly more solid in comparison.  We reviewed the images and compared them today in the office.  No other significant risk factors for nodule or cancer development.  OV 08/13/2022:Patient here today for repeat CT chest of April 2020 for follow-up.  Patient found to have multiple pulmonary nodules that are stable.  Largest being 4 mm in size.  Some of these are dating back since 2018 felt to be definitively benign.  She does have a few other very small punctate lesions that are new.  She also has stable 4 cm dilatation of her ascending aorta.    Past Medical History:  Diagnosis Date   Adhesive capsulitis of shoulder    Anxiety    Aortic aneurysm    Breast cancer    Cancer 2008   Breast   Congenital anomaly of aortic arch    Labyrinthitis, unspecified    Malignant neoplasm of breast (female), unspecified site    Other B-complex deficiencies    Other malaise and fatigue    Personal history of chemotherapy    Personal history of radiation therapy 2008   Unspecified hearing loss    Unspecified hereditary and idiopathic peripheral neuropathy    Unspecified tinnitus      Family History  Problem Relation Age of Onset   Seizures Mother    Migraines Mother    Allergic  rhinitis Mother    Diabetes Father    Hypertension Father    Stroke Father        several - first stroke late 34s and died 82   Hyperlipidemia Father    Colon polyps Sister    Breast cancer Sister    Cancer Sister        breast   Breast cancer Maternal Grandmother    Cancer Other        breast     Past Surgical History:  Procedure Laterality Date   ABDOMINAL HYSTERECTOMY  04/2010   robotic   BASAL CELL CARCINOMA EXCISION  2003   rt eye area   BREAST FIBROADENOMA SURGERY  1981   Left   BREAST LUMPECTOMY Right Oct.29 & Mar 19, 2007   Right   IR GENERIC HISTORICAL  04/16/2016   IR RADIOLOGIST EVAL & MGMT 04/16/2016 [facility]   LAPAROSCOPY  1982   for endometriosis   wisdom teeth extracted      Social History   Socioeconomic History   Marital status: Married    Spouse name: Not on file   Number of children: 0   Years of education: 12   Highest education level: Not on file  Occupational History   Occupation: HAIR STYLIST    Employer: [employer]   Occupation: Museum/gallery curator  Occupation: model  Tobacco Use   Smoking status: Never   Smokeless tobacco: Never  Vaping Use   Vaping Use: Never used  Substance and Sexual Activity   Alcohol use: Yes    Comment: wine with dinner a few nights a week   Drug use: No   Sexual activity: Yes  Other Topics Concern   Not on file  Social History Narrative   Married      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 Married '85, no children.    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 Strong interest in herbology and alternative healing.    Right-handed   Social Determinants of Corporate investment banker Strain: Not on file  Food Insecurity: Not on file  Transportation Needs: Not on file  Physical Activity: Not on file  Stress: Not on file  Social Connections: Not on file  Intimate  Partner Violence: Not on file     Allergies  Allergen Reactions   Bee Venom Anaphylaxis   Methylprednisolone Other (See Comments) and Anaphylaxis    Injection only - has polysorbate 80 preservative   Other     kerajinan   Polysorbate Anaphylaxis    "Polysorbate 80 preservative"   Latex Hives   Lyrica [Pregabalin] Nausea And Vomiting and Other (See Comments)    Dizziness    Miralax [Polyethylene Glycol] Hives    pruritus   Doxycycline Hyclate Rash     Outpatient Medications Prior to Visit  Medication Sig Dispense Refill   b complex vitamins tablet Take 1 tablet by mouth daily.     beclomethasone (QVAR REDIHALER) 40 MCG/ACT inhaler Inhale 1 puff into the lungs 2 (two) times daily. 10.6 g 3   Coenzyme Q10 (COQ10 PO) Take 100 mg by mouth 2 (two) times daily.     cyanocobalamin (,VITAMIN B-12,) 1000 MCG/ML injection Inject 1000 mcg once per month. 4 mL 3   cyclobenzaprine (FLEXERIL) 10 MG tablet Take 1 tablet (10 mg total) by mouth 3 (three) times daily as needed for muscle spasms. 90 tablet 3   EPINEPHrine 0.3 mg/0.3 mL IJ SOAJ injection INJECT 1 PEN INTO THE MUSCLE FOR ONE DOSE FOR BEE STING ALLERGY AS DIRECTED 2 each 0   escitalopram (LEXAPRO) 5 MG tablet Take 1.5 tablets (7.5 mg total) by mouth daily. 135 tablet 3   estradiol (ESTRACE) 0.1 MG/GM vaginal cream Insert 2 grams per vagina daily for 2 weeks, then every 2-3 days for 1 week, then 1x/week for a week, then prn. 42.5 g 3   fluticasone (FLONASE) 50 MCG/ACT nasal spray Place 2 sprays into both nostrils daily. 16 g 6   gabapentin (NEURONTIN) 100 MG capsule Take 1 capsule (100 mg total) by mouth 3 (three) times daily. 90 capsule 2   HYDROcodone-acetaminophen (NORCO/VICODIN) 5-325 MG tablet Take 1 tablet by mouth every 4 (four) hours as needed for moderate pain. 60 tablet 0   influenza vac split quadrivalent PF (FLUARIX) 0.5 ML injection Inject into the muscle. 0.5 mL 0   Omega-3 Fatty Acids (FISH OIL) 1000 MG CAPS Take 1,000 mg  by mouth 3 (three) times daily.     phenazopyridine (PYRIDIUM) 100 MG tablet Take 1 tablet (100 mg total) by mouth 3 (three) times daily as needed for pain. 10 tablet 0   Probiotic Product (PROBIOTIC DAILY PO) Take by mouth daily.     Saline (  SIMPLY SALINE) 0.9 % AERS Place 2 sprays each into the nose daily as needed. 500 mL 1   SYRINGE-NEEDLE, DISP, 3 ML (B-D 3CC LUER-LOK SYR 25GX1") 25G X 1" 3 ML MISC Use with Vitamin B12 injection once a month 3 each 3   Turmeric 500 MG TABS Take 500 mg by mouth daily.     Vitamin D, Ergocalciferol, (DRISDOL) 1.25 MG (50000 UNIT) CAPS capsule Take 1 capsule (50,000 Units total) by mouth every 7 (seven) days. 12 capsule 0   Zoster Vaccine Adjuvanted [facility]) injection      No facility-administered medications prior to visit.    Review of Systems  Constitutional:  Negative for chills, fever, malaise/fatigue and weight loss.  HENT:  Negative for hearing loss, sore throat and tinnitus.   Eyes:  Negative for blurred vision and double vision.  Respiratory:  Negative for cough, hemoptysis, sputum production, shortness of breath, wheezing and stridor.   Cardiovascular:  Negative for chest pain, palpitations, orthopnea, leg swelling and PND.  Gastrointestinal:  Negative for abdominal pain, constipation, diarrhea, heartburn, nausea and vomiting.  Genitourinary:  Negative for dysuria, hematuria and urgency.  Musculoskeletal:  Negative for joint pain and myalgias.  Skin:  Negative for itching and rash.  Neurological:  Negative for dizziness, tingling, weakness and headaches.  Endo/Heme/Allergies:  Negative for environmental allergies. Does not bruise/bleed easily.  Psychiatric/Behavioral:  Negative for depression. The patient is not nervous/anxious and does not have insomnia.   All other systems reviewed and are negative.    Objective:  Physical Exam Vitals reviewed.  Constitutional:      General: She is not in acute distress.    Appearance: She is  well-developed.  HENT:     Head: Normocephalic and atraumatic.  Eyes:     General: No scleral icterus.    Conjunctiva/sclera: Conjunctivae normal.     Pupils: Pupils are equal, round, and reactive to light.  Neck:     Vascular: No JVD.     Trachea: No tracheal deviation.  Cardiovascular:     Rate and Rhythm: Normal rate and regular rhythm.     Heart sounds: Normal heart sounds. No murmur heard. Pulmonary:     Effort: Pulmonary effort is normal. No tachypnea, accessory muscle usage or respiratory distress.     Breath sounds: Normal breath sounds. No stridor. No wheezing, rhonchi or rales.  Abdominal:     General: Bowel sounds are normal. There is no distension.     Palpations: Abdomen is soft.     Tenderness: There is no abdominal tenderness.  Musculoskeletal:        General: No tenderness.     Cervical back: Neck supple.  Lymphadenopathy:     Cervical: No cervical adenopathy.  Skin:    General: Skin is warm and dry.     Capillary Refill: Capillary refill takes less than 2 seconds.     Findings: No rash.  Neurological:     Mental Status: She is alert and oriented to person, place, and time.  Psychiatric:        Behavior: Behavior normal.      Vitals:   08/13/22 0830  BP: 120/74  Pulse: 72  Temp: 98.2 F (36.8 C)  TempSrc: Oral  SpO2: 99%  Weight: 138 lb 12.8 oz (63 kg)  Height: 5' 6.5" (1.689 m)   99% on RA BMI Readings from Last 3 Encounters:  08/13/22 22.07 kg/m  08/07/22 21.78 kg/m  06/12/22 21.62 kg/m   Wt Readings from Last  3 Encounters:  08/13/22 138 lb 12.8 oz (63 kg)  08/07/22 137 lb (62.1 kg)  06/12/22 136 lb (61.7 kg)     CBC    Component Value Date/Time   WBC 6.3 10/23/2021 0851   RBC 4.91 10/23/2021 0851   HGB 15.1 (H) 10/23/2021 0851   HGB 14.6 12/14/2016 0925   HCT 45.0 10/23/2021 0851   HCT 43.1 12/14/2016 0925   PLT 269.0 10/23/2021 0851   PLT 305 12/14/2016 0925   MCV 91.7 10/23/2021 0851   MCV 90.1 12/14/2016 0925   MCH  30.5 12/14/2016 0925   MCH 30.8 07/18/2012 1250   MCHC 33.6 10/23/2021 0851   RDW 13.2 10/23/2021 0851   RDW 13.0 12/14/2016 0925   LYMPHSABS 1.9 10/23/2021 0851   LYMPHSABS 1.6 12/14/2016 0925   MONOABS 0.5 10/23/2021 0851   MONOABS 0.5 12/14/2016 0925   EOSABS 0.2 10/23/2021 0851   EOSABS 0.3 12/14/2016 0925   BASOSABS 0.1 10/23/2021 0851   BASOSABS 0.1 12/14/2016 0925     Chest Imaging: 2018 CT chest: Faint to 5 mm subsolid appearing lesion in the right lateral aspect of the lung.  July 2023 coronary calcium CT scan: Appears similar size 5 mm lesion may be slightly more solid in comparison however the lung window imaging is slightly different based on the different types of CT imaging completed. The patient's images have been independently reviewed by me.    April 2024 CT chest: Bilateral pulmonary nodule stable. Few small new punctate pulmonary nodules. Stable ascending thoracic aneurysm. The patient's images have been independently reviewed by me.    Pulmonary Functions Testing Results:    Latest Ref Rng & Units 11/29/2014    3:00 PM  PFT Results  FVC-Pre L 3.73   FVC-Predicted Pre % 100   FVC-Post L 3.67   FVC-Predicted Post % 98   Pre FEV1/FVC % % 78   Post FEV1/FCV % % 81   FEV1-Pre L 2.90   FEV1-Predicted Pre % 99   FEV1-Post L 2.97   DLCO uncorrected ml/min/mmHg 20.42   DLCO UNC% % 73   DLVA Predicted % 74   TLC L 5.39   TLC % Predicted % 99   RV % Predicted % 81     FeNO:   Pathology:   Echocardiogram:   Heart Catheterization:     Assessment & Plan:     ICD-10-CM   1. Lung nodule  R91.1     2. History of breast cancer  Z85.3     3. Nonsmoker  Z78.9     4. Aneurysm of ascending aorta without rupture  I71.21       Discussion:  This is a 62 year old female follow-up today CT imaging with bilateral pulmonary nodules.  She also has a few more small pulmonary nodules others are stable.  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  Plan: Will have a repeat noncontrasted CT chest in 1 year. I expect her nodules are inflammatory related to her history as being a Veterinary surgeon.  She does not wear a mask when she throws clay. However with her history of breast cancer reasonable to have a repeat scan in 1 year. Follow-up with [place] 1 year after repeat CT complete to see me or SG, NP.    Current Outpatient Medications:    b complex vitamins tablet, Take 1 tablet by mouth daily., Disp: , Rfl:    beclomethasone (QVAR REDIHALER) 40 MCG/ACT inhaler, Inhale 1  puff into the lungs 2 (two) times daily., Disp: 10.6 g, Rfl: 3   Coenzyme Q10 (COQ10 PO), Take 100 mg by mouth 2 (two) times daily., Disp: , Rfl:    cyanocobalamin (,VITAMIN B-12,) 1000 MCG/ML injection, Inject 1000 mcg once per month., Disp: 4 mL, Rfl: 3   cyclobenzaprine (FLEXERIL) 10 MG tablet, Take 1 tablet (10 mg total) by mouth 3 (three) times daily as needed for muscle spasms., Disp: 90 tablet, Rfl: 3   EPINEPHrine 0.3 mg/0.3 mL IJ SOAJ injection, INJECT 1 PEN INTO THE MUSCLE FOR ONE DOSE FOR BEE STING ALLERGY AS DIRECTED, Disp: 2 each, Rfl: 0   escitalopram (LEXAPRO) 5 MG tablet, Take 1.5 tablets (7.5 mg total) by mouth daily., Disp: 135 tablet, Rfl: 3   estradiol (ESTRACE) 0.1 MG/GM vaginal cream, Insert 2 grams per vagina daily for 2 weeks, then every 2-3 days for 1 week, then 1x/week for a week, then prn., Disp: 42.5 g, Rfl: 3   fluticasone (FLONASE) 50 MCG/ACT nasal spray, Place 2 sprays into both nostrils daily., Disp: 16 g, Rfl: 6   gabapentin (NEURONTIN) 100 MG capsule, Take 1 capsule (100 mg total) by mouth 3 (three) times daily., Disp: 90 capsule, Rfl: 2   HYDROcodone-acetaminophen (NORCO/VICODIN) 5-325 MG tablet, Take 1 tablet by mouth every 4 (four) hours as needed for moderate pain., Disp: 60 tablet, Rfl: 0   influenza vac split quadrivalent PF (FLUARIX) 0.5 ML injection, Inject into the muscle., Disp: 0.5 mL, Rfl: 0   Omega-3 Fatty Acids (FISH OIL) 1000 MG  CAPS, Take 1,000 mg by mouth 3 (three) times daily., Disp: , Rfl:    phenazopyridine (PYRIDIUM) 100 MG tablet, Take 1 tablet (100 mg total) by mouth 3 (three) times daily as needed for pain., Disp: 10 tablet, Rfl: 0   Probiotic Product (PROBIOTIC DAILY PO), Take by mouth daily., Disp: , Rfl:    Saline (SIMPLY SALINE) 0.9 % AERS, Place 2 sprays each into the nose daily as needed., Disp: 500 mL, Rfl: 1   SYRINGE-NEEDLE, DISP, 3 ML (B-D 3CC LUER-LOK SYR 25GX1") 25G X 1" 3 ML MISC, Use with Vitamin B12 injection once a month, Disp: 3 each, Rfl: 3   Turmeric 500 MG TABS, Take 500 mg by mouth daily., Disp: , Rfl:    Vitamin D, Ergocalciferol, (DRISDOL) 1.25 MG (50000 UNIT) CAPS capsule, Take 1 capsule (50,000 Units total) by mouth every 7 (seven) days., Disp: 12 capsule, Rfl: 0   Zoster Vaccine Adjuvanted [facility]) injection, , Disp: , Rfl:     Josephine Igo, DO [facility] 08/13/2022 8:33 AM

## 2022-08-13 NOTE — Patient Instructions (Signed)
Thank you for visiting Dr. Tonia Brooms at [facility]. Today we recommend the following:  Orders Placed This Encounter  Procedures   CT ANGIO CHEST AORTA W/CM & OR WO/CM   Return in about 1 year (around 08/13/2023) for with APP or Dr. Tonia Brooms, after CT Chest.    Please do your part to reduce the spread of COVID-19.

## 2022-08-14 ENCOUNTER — Other Ambulatory Visit (HOSPITAL_BASED_OUTPATIENT_CLINIC_OR_DEPARTMENT_OTHER): Payer: Self-pay

## 2022-08-23 ENCOUNTER — Ambulatory Visit: Payer: 59 | Admitting: Pulmonary Disease

## 2022-09-01 NOTE — Progress Notes (Signed)
Reviewed at last office visit   Thanks,  BLI  Josephine Igo, DO  Pulmonary Critical Care 09/01/2022 6:39 PM

## 2022-09-04 ENCOUNTER — Telehealth: Payer: Self-pay | Admitting: Neurology

## 2022-09-04 NOTE — Telephone Encounter (Signed)
Sent mychart msg informing pt of appt change due to provider schedule change 

## 2022-09-10 ENCOUNTER — Other Ambulatory Visit (HOSPITAL_BASED_OUTPATIENT_CLINIC_OR_DEPARTMENT_OTHER): Payer: Self-pay

## 2022-09-13 NOTE — Progress Notes (Signed)
[facility] [address] [place] [address] Phone: [phone] Subjective:   Bruce Donath, am serving as a scribe for Dr. Antoine Primas.  I'm seeing this patient by the request  of:  Shelva Majestic, MD  CC: Upper back and lower back pain follow-up  [id number]  STARLET BERGFELD is a 62 y.o. female coming in with complaint of back and neck pain. OMT 08/07/2022. Patient states that she has been doing exercises. Getting sore in thoracic spine the following day after doing HEP. Using shiatsu massager that she lays on.   R elbow pain is improving. Has occasional discomfort over lateral epi. Immobilizes wrist at night.    Medications patient has been prescribed: Vit D, QVAR  Taking:         Reviewed prior external information including notes and imaging from previsou exam, outside providers and external EMR if available.   As well as notes that were available from care everywhere and other healthcare systems.  Past medical history, social, surgical and family history all reviewed in electronic medical record.  No pertanent information unless stated regarding to the chief complaint.   Past Medical History:  Diagnosis Date   Adhesive capsulitis of shoulder    Anxiety    Aortic aneurysm (HCC)    Breast cancer (HCC)    Cancer (HCC) 2008   Breast   Congenital anomaly of aortic arch    Labyrinthitis, unspecified    Malignant neoplasm of breast (female), unspecified site    Other B-complex deficiencies    Other malaise and fatigue    Personal history of chemotherapy    Personal history of radiation therapy 2008   Unspecified hearing loss    Unspecified hereditary and idiopathic peripheral neuropathy    Unspecified tinnitus     Allergies  Allergen Reactions   Bee Venom Anaphylaxis   Methylprednisolone Other (See Comments) and Anaphylaxis    Injection only - has polysorbate 80 preservative   Other     kerajinan   Polysorbate  Anaphylaxis    "Polysorbate 80 preservative"   Latex Hives   Lyrica [Pregabalin] Nausea And Vomiting and Other (See Comments)    Dizziness    Miralax [Polyethylene Glycol] Hives    pruritus   Doxycycline Hyclate Rash     Review of Systems:  No headache, visual changes, nausea, vomiting, diarrhea, constipation, dizziness, abdominal pain, skin rash, fevers, chills, night sweats, weight loss, swollen lymph nodes, body aches, joint swelling, chest pain, shortness of breath, mood changes. POSITIVE muscle aches  Objective  Blood pressure 120/88, pulse 75, height 5' 6.5" (1.689 m), weight 135 lb (61.2 kg), SpO2 98 %.   General: No apparent distress alert and oriented x3 mood and affect normal, dressed appropriately.  HEENT: Pupils equal, extraocular movements intact  Respiratory: Patient's speak in full sentences and does not appear short of breath  Cardiovascular: No lower extremity edema, non tender, no erythema upper back seems to be more tender to palpation in the paraspinal musculature in the parascapular area right greater than left.  Does have some limited sidebending of the neck bilaterally.  Patient does have tightness noted in the thoracolumbar juncture  Osteopathic findings C7 flexed rotated and side bent right T3 extended rotated and side bent right inhaled rib T11 extended rotated and side bent left L1 flexed rotated and side bent right Sacrum right on right       Assessment and Plan:  Scapular dyskinesis Patient does have scapular  dyskinesis.  He is making some progress at the moment. Extremely well though to osteopathic manipulation.  Was able to get significant more movement noted patient is to continue to work on home exercises and icing regimen.  Increase activity slowly otherwise.  Follow-up with me again in 6 to 8 weeks Did discuss patient's gabapentin with him using it on an intermittent basis and will refill accordingly  Nonallopathic problems  Decision today  to treat with OMT was based on Physical Exam  After verbal consent patient was treated with HVLA, ME, FPR techniques in cervical, rib, thoracic, lumbar, and sacral  areas  Patient tolerated the procedure well with improvement in symptoms  Patient given exercises, stretches and lifestyle modifications  See medications in patient instructions if given  Patient will follow up in 4-8 weeks    The above documentation has been reviewed and is accurate and complete Judi Saa, DO          Note: This dictation was prepared with Dragon dictation along with smaller phrase technology. Any transcriptional errors that result from this process are unintentional.

## 2022-09-14 ENCOUNTER — Encounter: Payer: Self-pay | Admitting: Family Medicine

## 2022-09-14 ENCOUNTER — Ambulatory Visit: Payer: 59 | Admitting: Family Medicine

## 2022-09-14 VITALS — BP 120/88 | HR 75 | Ht 66.5 in | Wt 135.0 lb

## 2022-09-14 DIAGNOSIS — M9901 Segmental and somatic dysfunction of cervical region: Secondary | ICD-10-CM | POA: Diagnosis not present

## 2022-09-14 DIAGNOSIS — M9903 Segmental and somatic dysfunction of lumbar region: Secondary | ICD-10-CM | POA: Diagnosis not present

## 2022-09-14 DIAGNOSIS — G2589 Other specified extrapyramidal and movement disorders: Secondary | ICD-10-CM | POA: Diagnosis not present

## 2022-09-14 DIAGNOSIS — M9902 Segmental and somatic dysfunction of thoracic region: Secondary | ICD-10-CM | POA: Diagnosis not present

## 2022-09-14 DIAGNOSIS — M9904 Segmental and somatic dysfunction of sacral region: Secondary | ICD-10-CM

## 2022-09-14 DIAGNOSIS — M9908 Segmental and somatic dysfunction of rib cage: Secondary | ICD-10-CM | POA: Diagnosis not present

## 2022-09-14 NOTE — Assessment & Plan Note (Signed)
Patient does have scapular dyskinesis.  He is making some progress at the moment. Extremely well though to osteopathic manipulation.  Was able to get significant more movement noted patient is to continue to work on home exercises and icing regimen.  Increase activity slowly otherwise.  Follow-up with me again in 6 to 8 weeks

## 2022-09-14 NOTE — Patient Instructions (Signed)
Good to see you! You are making great progress Had wonderful movement See you again in 2-3 months

## 2022-09-17 ENCOUNTER — Other Ambulatory Visit (HOSPITAL_BASED_OUTPATIENT_CLINIC_OR_DEPARTMENT_OTHER): Payer: Self-pay

## 2022-09-17 ENCOUNTER — Other Ambulatory Visit: Payer: Self-pay | Admitting: Family Medicine

## 2022-09-17 ENCOUNTER — Other Ambulatory Visit: Payer: Self-pay | Admitting: Neurology

## 2022-09-17 MED ORDER — HYDROCODONE-ACETAMINOPHEN 5-325 MG PO TABS
1.0000 | ORAL_TABLET | ORAL | 0 refills | Status: DC | PRN
  Filled 2022-09-17: qty 60, 10d supply, fill #0

## 2022-09-18 ENCOUNTER — Other Ambulatory Visit (HOSPITAL_BASED_OUTPATIENT_CLINIC_OR_DEPARTMENT_OTHER): Payer: Self-pay

## 2022-09-18 MED ORDER — VITAMIN D (ERGOCALCIFEROL) 1.25 MG (50000 UNIT) PO CAPS
50000.0000 [IU] | ORAL_CAPSULE | ORAL | 0 refills | Status: DC
  Filled 2022-09-18: qty 4, 28d supply, fill #0
  Filled 2022-10-10: qty 4, 28d supply, fill #1
  Filled 2022-11-06: qty 4, 28d supply, fill #2

## 2022-10-10 ENCOUNTER — Other Ambulatory Visit (HOSPITAL_BASED_OUTPATIENT_CLINIC_OR_DEPARTMENT_OTHER): Payer: Self-pay

## 2022-10-11 ENCOUNTER — Other Ambulatory Visit (HOSPITAL_BASED_OUTPATIENT_CLINIC_OR_DEPARTMENT_OTHER): Payer: Self-pay

## 2022-10-12 ENCOUNTER — Other Ambulatory Visit (HOSPITAL_BASED_OUTPATIENT_CLINIC_OR_DEPARTMENT_OTHER): Payer: Self-pay

## 2022-10-15 ENCOUNTER — Other Ambulatory Visit (HOSPITAL_BASED_OUTPATIENT_CLINIC_OR_DEPARTMENT_OTHER): Payer: Self-pay

## 2022-10-16 ENCOUNTER — Other Ambulatory Visit (HOSPITAL_BASED_OUTPATIENT_CLINIC_OR_DEPARTMENT_OTHER): Payer: Self-pay

## 2022-10-17 ENCOUNTER — Other Ambulatory Visit (HOSPITAL_BASED_OUTPATIENT_CLINIC_OR_DEPARTMENT_OTHER): Payer: Self-pay

## 2022-10-22 ENCOUNTER — Other Ambulatory Visit (HOSPITAL_BASED_OUTPATIENT_CLINIC_OR_DEPARTMENT_OTHER): Payer: Self-pay

## 2022-10-23 ENCOUNTER — Telehealth: Payer: Self-pay | Admitting: Neurology

## 2022-10-23 NOTE — Telephone Encounter (Signed)
LVM and sent mychart msg informing pt of appt change due to MD being out 

## 2022-10-24 ENCOUNTER — Other Ambulatory Visit (HOSPITAL_BASED_OUTPATIENT_CLINIC_OR_DEPARTMENT_OTHER): Payer: Self-pay

## 2022-10-25 ENCOUNTER — Other Ambulatory Visit (HOSPITAL_BASED_OUTPATIENT_CLINIC_OR_DEPARTMENT_OTHER): Payer: Self-pay

## 2022-10-26 ENCOUNTER — Other Ambulatory Visit (HOSPITAL_BASED_OUTPATIENT_CLINIC_OR_DEPARTMENT_OTHER): Payer: Self-pay

## 2022-10-29 ENCOUNTER — Encounter: Payer: Self-pay | Admitting: Family Medicine

## 2022-10-29 ENCOUNTER — Ambulatory Visit (INDEPENDENT_AMBULATORY_CARE_PROVIDER_SITE_OTHER): Payer: 59 | Admitting: Family Medicine

## 2022-10-29 ENCOUNTER — Other Ambulatory Visit (HOSPITAL_BASED_OUTPATIENT_CLINIC_OR_DEPARTMENT_OTHER): Payer: Self-pay

## 2022-10-29 VITALS — BP 110/60 | HR 75 | Temp 97.8°F | Ht 66.5 in | Wt 133.2 lb

## 2022-10-29 DIAGNOSIS — Z0000 Encounter for general adult medical examination without abnormal findings: Secondary | ICD-10-CM

## 2022-10-29 DIAGNOSIS — R739 Hyperglycemia, unspecified: Secondary | ICD-10-CM

## 2022-10-29 DIAGNOSIS — E538 Deficiency of other specified B group vitamins: Secondary | ICD-10-CM

## 2022-10-29 DIAGNOSIS — Z131 Encounter for screening for diabetes mellitus: Secondary | ICD-10-CM

## 2022-10-29 DIAGNOSIS — Z1283 Encounter for screening for malignant neoplasm of skin: Secondary | ICD-10-CM | POA: Diagnosis not present

## 2022-10-29 DIAGNOSIS — E785 Hyperlipidemia, unspecified: Secondary | ICD-10-CM

## 2022-10-29 DIAGNOSIS — I7121 Aneurysm of the ascending aorta, without rupture: Secondary | ICD-10-CM

## 2022-10-29 DIAGNOSIS — E559 Vitamin D deficiency, unspecified: Secondary | ICD-10-CM

## 2022-10-29 DIAGNOSIS — Z90710 Acquired absence of both cervix and uterus: Secondary | ICD-10-CM

## 2022-10-29 DIAGNOSIS — G43109 Migraine with aura, not intractable, without status migrainosus: Secondary | ICD-10-CM | POA: Diagnosis not present

## 2022-10-29 LAB — COMPREHENSIVE METABOLIC PANEL
ALT: 21 U/L (ref 0–35)
AST: 24 U/L (ref 0–37)
Albumin: 4.5 g/dL (ref 3.5–5.2)
Alkaline Phosphatase: 65 U/L (ref 39–117)
BUN: 18 mg/dL (ref 6–23)
CO2: 29 mEq/L (ref 19–32)
Calcium: 9.6 mg/dL (ref 8.4–10.5)
Chloride: 101 mEq/L (ref 96–112)
Creatinine, Ser: 0.85 mg/dL (ref 0.40–1.20)
GFR: 72.69 mL/min (ref >60.00)
Glucose, Bld: 89 mg/dL (ref 70–99)
Potassium: 5.4 mEq/L — ABNORMAL HIGH (ref 3.5–5.1)
Sodium: 137 mEq/L (ref 135–145)
Total Bilirubin: 0.5 mg/dL (ref 0.2–1.2)
Total Protein: 6.9 g/dL (ref 6.0–8.3)

## 2022-10-29 LAB — VITAMIN B12: Vitamin B-12: 411 pg/mL (ref 211–911)

## 2022-10-29 LAB — HEMOGLOBIN A1C: Hgb A1c MFr Bld: 5.7 % (ref 4.6–6.5)

## 2022-10-29 LAB — CBC WITH DIFFERENTIAL/PLATELET
Basophils Absolute: 0 10*3/uL (ref 0.0–0.1)
Basophils Relative: 1 % (ref 0.0–3.0)
Eosinophils Absolute: 0.2 10*3/uL (ref 0.0–0.7)
Eosinophils Relative: 4.6 % (ref 0.0–5.0)
HCT: 45.4 % (ref 36.0–46.0)
Hemoglobin: 15 g/dL (ref 12.0–15.0)
Lymphocytes Relative: 31.6 % (ref 12.0–46.0)
Lymphs Abs: 1.5 10*3/uL (ref 0.7–4.0)
MCHC: 33 g/dL (ref 30.0–36.0)
MCV: 92 fl (ref 78.0–100.0)
Monocytes Absolute: 0.4 10*3/uL (ref 0.1–1.0)
Monocytes Relative: 9.1 % (ref 3.0–12.0)
Neutro Abs: 2.6 10*3/uL (ref 1.4–7.7)
Neutrophils Relative %: 53.7 % (ref 43.0–77.0)
Platelets: 311 10*3/uL (ref 150.0–400.0)
RBC: 4.93 Mil/uL (ref 3.87–5.11)
RDW: 12.8 % (ref 11.5–15.5)
WBC: 4.9 10*3/uL (ref 4.0–10.5)

## 2022-10-29 LAB — MAGNESIUM: Magnesium: 2.3 mg/dL (ref 1.5–2.5)

## 2022-10-29 LAB — LIPID PANEL
Cholesterol: 206 mg/dL — ABNORMAL HIGH (ref 0–200)
HDL: 53 mg/dL (ref >39.00)
LDL Cholesterol: 129 mg/dL — ABNORMAL HIGH (ref 0–99)
NonHDL: 153.27
Total CHOL/HDL Ratio: 4
Triglycerides: 123 mg/dL (ref 0.0–149.0)
VLDL: 24.6 mg/dL (ref 0.0–40.0)

## 2022-10-29 LAB — VITAMIN D 25 HYDROXY (VIT D DEFICIENCY, FRACTURES): VITD: 51.17 ng/mL (ref 30.00–100.00)

## 2022-10-29 MED ORDER — CYANOCOBALAMIN 1000 MCG/ML IJ SOLN
INTRAMUSCULAR | 3 refills | Status: AC
  Filled 2022-10-29: qty 1, 30d supply, fill #0
  Filled 2022-12-06: qty 1, 30d supply, fill #1
  Filled 2023-01-05 – 2023-01-07 (×2): qty 1, 30d supply, fill #2
  Filled 2023-02-12: qty 1, 30d supply, fill #3

## 2022-10-29 MED ORDER — ESCITALOPRAM OXALATE 5 MG PO TABS
7.5000 mg | ORAL_TABLET | Freq: Every day | ORAL | 3 refills | Status: DC
  Filled 2022-10-29 – 2022-11-06 (×2): qty 45, 30d supply, fill #0
  Filled 2022-12-06: qty 45, 30d supply, fill #1
  Filled 2023-01-05 – 2023-01-07 (×2): qty 45, 30d supply, fill #2

## 2022-10-29 NOTE — Patient Instructions (Addendum)
Let us know if you get any COVID vaccines this fall.  Voltaren gel has not helped-could trial 4x a day for 2 weeks to see if we can settle it.   We have placed a referral for you today to Dr. Onalee Hua dermatology.you will see # listed below- you can call this if you have not heard within a week.  Please stop by lab before you go If you have mychart- we will send your results within 3 business days of [place] receiving them.  If you do not have mychart- we will call you about results within 5 business days of [place] receiving them.  *please also note that you will see labs on mychart as soon as they post. I will later go in and write notes on them- will say "notes from Dr. Durene Cal"   Recommended follow up: Return in about 1 year (around 10/29/2023) for physical or sooner if needed.Schedule b4 you leave.

## 2022-10-29 NOTE — Progress Notes (Signed)
Phone [phone]   Subjective:  Patient presents today for their annual physical. Chief complaint-noted.   See problem oriented charting- ROS- full  review of systems was completed and negative Per full ROS sheet completed by patient other than some arthritic pains- such as in fingers  The following were reviewed and entered/updated in epic: Past Medical History:  Diagnosis Date   Adhesive capsulitis of shoulder    Anxiety    Aortic aneurysm (HCC)    Breast cancer (HCC)    Cancer (HCC) 2008   Breast   Congenital anomaly of aortic arch    Labyrinthitis, unspecified    Malignant neoplasm of breast (female), unspecified site    Other B-complex deficiencies    Other malaise and fatigue    Personal history of chemotherapy    Personal history of radiation therapy 2008   Unspecified hearing loss    Unspecified hereditary and idiopathic peripheral neuropathy    Unspecified tinnitus    Patient Active Problem List   Diagnosis Date Noted   Trigeminal neuralgia of right side of face 09/30/2016    Priority: High   Migraine with aura and without status migrainosus 09/30/2016    Priority: High   Abnormal MRA, brain 05/15/2016    Priority: High   Aortic root aneurysm (HCC) 05/03/2016    Priority: High   History of right breast cancer 08/23/2014    Priority: High   Tinnitus 01/10/2010    Priority: High   Dyspareunia in female 11/22/2021    Priority: Medium    Vitamin D deficiency 10/11/2017    Priority: Medium    Hyperlipidemia 11/28/2010    Priority: Medium    Hearing loss 01/10/2010    Priority: Medium    Vitamin B12 deficiency 01/09/2010    Priority: Medium    History of total hysterectomy 10/14/2019    Priority: Low   Seborrheic dermatitis 08/11/2019    Priority: Low   Malignant neoplasm of skin 08/11/2019    Priority: Low   Breast lump 08/11/2019    Priority: Low   Drug-induced anaphylaxis 07/09/2019    Priority: Low   Allergy to hymenoptera venom 07/09/2019     Priority: Low   Lipoma 02/12/2019    Priority: Low   Bee sting-induced anaphylaxis 10/11/2017    Priority: Low   Sensorineural hearing loss (SNHL) of right ear 05/15/2016    Priority: Low   Dyspnea 12/14/2014    Priority: Low   Other allergic rhinitis 12/14/2014    Priority: Low   Genetic testing 12/08/2014    Priority: Low   Loss of transverse plantar arch 07/16/2014    Priority: Low   Hereditary and idiopathic peripheral neuropathy 01/10/2010    Priority: Low   Fatigue 07/14/2008    Priority: Low   History of skin cancer 05/26/2007    Priority: Low   Scapular dyskinesis 08/07/2022    Priority: 1.   Somatic dysfunction of spine, thoracic 08/07/2022    Priority: 1.   Partial tear of common extensor tendon of right elbow 03/14/2022    Priority: 1.   Menopausal syndrome 12/21/2021    Priority: 1.   History of urticaria 07/09/2019    Priority: 1.   Past Surgical History:  Procedure Laterality Date   ABDOMINAL HYSTERECTOMY  04/2010   robotic   BASAL CELL CARCINOMA EXCISION  2003   rt eye area   BREAST FIBROADENOMA SURGERY  1981   Left   BREAST LUMPECTOMY Right Oct.29 & Mar 19, 2007  Right   IR GENERIC HISTORICAL  04/16/2016   IR RADIOLOGIST EVAL & MGMT 04/16/2016 [facility]   LAPAROSCOPY  1982   for endometriosis   wisdom teeth extracted      Family History  Problem Relation Age of Onset   Seizures Mother    Migraines Mother    Allergic rhinitis Mother    Diabetes Father    Hypertension Father    Stroke Father        several - first stroke late 56s and died 15   Hyperlipidemia Father    Colon polyps Sister    Breast cancer Sister    Cancer Sister        breast   Breast cancer Maternal Grandmother    Cancer Other        breast    Medications- reviewed and updated Current Outpatient Medications  Medication Sig Dispense Refill   b complex vitamins tablet Take 1 tablet by mouth daily.     Coenzyme Q10 (COQ10 PO) Take 100 mg by mouth 2 (two) times  daily.     EPINEPHrine 0.3 mg/0.3 mL IJ SOAJ injection INJECT 1 PEN INTO THE MUSCLE FOR ONE DOSE FOR BEE STING ALLERGY AS DIRECTED 2 each 0   estradiol (ESTRACE) 0.1 MG/GM vaginal cream Insert 2 grams into the vagina daily for 2 weeks, then every 2-3 days for 1 week, then once weekly for 1 week, then as needed. 42.5 g 3   fluticasone (FLONASE) 50 MCG/ACT nasal spray Place 2 sprays into both nostrils daily. 16 g 6   gabapentin (NEURONTIN) 100 MG capsule Take 1 capsule (100 mg total) by mouth 3 (three) times daily. 90 capsule 2   Omega-3 Fatty Acids (FISH OIL) 1000 MG CAPS Take 1,000 mg by mouth 3 (three) times daily.     Probiotic Product (PROBIOTIC DAILY PO) Take by mouth daily.     Saline (SIMPLY SALINE) 0.9 % AERS Place 2 sprays each into the nose daily as needed. 500 mL 1   SYRINGE-NEEDLE, DISP, 3 ML (B-D 3CC LUER-LOK SYR 25GX1") 25G X 1" 3 ML MISC Use with Vitamin B12 injection once a month 3 each 3   Turmeric 500 MG TABS Take 500 mg by mouth daily.     Vitamin D, Ergocalciferol, (DRISDOL) 1.25 MG (50000 UNIT) CAPS capsule Take 1 capsule (50,000 Units total) by mouth every 7 (seven) days. 12 capsule 0   cyanocobalamin (VITAMIN B12) 1000 MCG/ML injection Inject 1000 mcg once per month. 4 mL 3   cyclobenzaprine (FLEXERIL) 10 MG tablet Take 1 tablet (10 mg total) by mouth 3 (three) times daily as needed for muscle spasms. (Patient not taking: Reported on 10/29/2022) 90 tablet 3   escitalopram (LEXAPRO) 5 MG tablet Take 1.5 tablets (7.5 mg total) by mouth daily. 135 tablet 3   HYDROcodone-acetaminophen (NORCO/VICODIN) 5-325 MG tablet Take 1 tablet by mouth every 4 (four) hours as needed for moderate pain. (Patient not taking: Reported on 10/29/2022) 60 tablet 0   phenazopyridine (PYRIDIUM) 100 MG tablet Take 1 tablet (100 mg total) by mouth 3 (three) times daily as needed for pain. 10 tablet 0   No current facility-administered medications for this visit.    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 Married '85, no children.    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 Strong interest in herbology and alternative healing.    Right-handed   Objective  Objective:  BP 110/60   Pulse 75   Temp 97.8 F (36.6 C)   Ht 5' 6.5" (1.689 m)   Wt 133 lb 3.2 oz (60.4 kg)   SpO2 97%   BMI 21.18 kg/m  Gen: NAD, resting comfortably HEENT: Mucous membranes are moist. Oropharynx normal Neck: no thyromegaly CV: RRR no murmurs rubs or gallops Lungs: CTAB no crackles, wheeze, rhonchi Abdomen: soft/nontender/nondistended/normal bowel sounds. No rebound or guarding.  Ext: no edema Skin: warm, dry Neuro: grossly normal, moves all extremities, PERRLA   Assessment and Plan   62 y.o. female presenting for annual physical.  Health Maintenance counseling: 1. Anticipatory guidance: Patient counseled regarding regular dental exams -q6 months, eye exams - every other year may go to yearly,  avoiding smoking and second hand smoke , limiting alcohol to 1 beverage per day- 4x a week with dinner , no illicit drugs .   2. Risk factor reduction:  Advised patient of need for regular exercise and diet rich and fruits and vegetables to reduce risk of heart attack and stroke.  Exercise- 4-7 days per week- walking 25 miles a week, yoga, some weights- doing some upper  body, also stretches.  Diet/weight management-healthy weight and continued healthy diet and good hydration.  Wt Readings from Last 3 Encounters:  10/29/22 133 lb 3.2 oz (60.4 kg)  09/14/22 135 lb (61.2 kg)  08/13/22 138 lb 12.8 oz (63 kg)  3. Immunizations/screenings/ancillary studies- had fall COVID shot 2023- recommend again 2024 fall  Immunization History  Administered Date(s) Administered   Influenza Inj Mdck Quad Pf 02/05/2018   Influenza Whole 03/04/2008, 02/26/2010   Influenza,inj,Quad PF,6+ Mos 01/28/2014, 01/10/2017, 02/05/2019, 02/02/2020, 01/15/2022   Influenza,trivalent, recombinat, inj, PF 02/24/2015   Influenza-Unspecified 03/03/2013, 01/25/2016, 02/05/2018, 03/31/2021   PFIZER Comirnaty(Gray Top)Covid-19 Tri-Sucrose Vaccine 07/12/2020, 02/03/2022   PFIZER(Purple Top)SARS-COV-2 Vaccination 12/28/2019, 01/18/2020   Pfizer Covid-19 Vaccine Bivalent Booster 45yrs & up 04/14/2021   Td 03/04/2008   Tdap 10/13/2018   Zoster Recombinant(Shingrix) 05/14/2018, 07/15/2018, 12/29/2018   Zoster, Live 12/26/2018  4. Cervical cancer screening- sees GYN- history of total hysterectomy for benign reasons-still gets Pap smears with GYN per their recommendations 5. Breast cancer screening-history of right breast cancer-gets breast exam with GYN and mammogram most recently- 05/05/22 with 3d  6. Colon cancer screening - 11/25/2014 with 10-year repeat planned 7. Skin cancer screening-sees Dr. Dorita Sciara office Dr. Wallace Cullens previously- would like to see Dr. Onalee Hua. advised regular sunscreen use.  8. Birth control/STD check- hysterectomy and monogamous  9. Osteoporosis screening at 65- reports had done with oncology in 2018  - consider repeat at 63 10. Smoking associated screening -never smoker  Status of chronic or acute concerns   #hyperlipidemia- lpa not elevated and   ct cardiac 11/15/21 of 0  S: Medication:none Lab Results  Component Value Date   CHOL 207 (H) 10/23/2021   HDL  58.40 10/23/2021    LDLCALC 125 (H) 10/23/2021   LDLDIRECT 110.1 04/26/2011   TRIG 116.0 10/23/2021   CHOLHDL 4 10/23/2021  A/P: hopefully stable or improved- update lipid panel today. Continue without meds for now   # Anxiety S:Medication:   Lexapro 7.5 mg Control: reasonable control no thoughts of self harm A/P: stable- continue current medicines    # Migraines and history trigeminal neuralgia right sided- sees neurology Dr. Lucia Gaskins S:Medication:  hydrocodone available as needed- half tablet every 4 hours as needed on bad trigeminal neuralgia - uses topical magnesium and peppermint as well - has been somewhat worse lately- working with Dr. Lucia Gaskins A/P: overall pattern slightly worse- continue close follow up with neurology    # Aortic root aneurysm S:follow up with cardiothoracic surgery in past- will be checked again with CT angio with Dr. Tonia Brooms A/P: over 5 years up about 0.2 - will continue to monitor    # B12 deficiency S: Current treatment/medication (oral vs. IM):  b complex plus injections monthly (out lately) A/P: hopefully stable off injections- update b12 today. Continue current meds for now    #Vitamin D deficiency- she prefers higher end of range S: Medication: prior boost still finishing this- prior 2000 units daily A/P: hopefully stable- update vitamin D today. Continue current meds for now    #hyperglycemia- capillary blood glucose 101 in past- will get a1c in 2024   #lung nodules- follow up with Dr. Tonia Brooms 08/13/22  last visit- plan for 1 year follow up   #arthritis- starting to note more pains- has in 2nd finger- trying tart cherry. Voltaren gel has not helped-could trial 4x a day for 2 weeks to see if we can settle it. Already on turmeric   #magnesium- started a month ago  Recommended follow up: Return in about 1 year (around 10/29/2023) for physical or sooner if needed.Schedule b4 you leave. Future Appointments  Date Time Provider Department Center  11/26/2022 10:00 AM Judi Saa, DO [facility] None  03/11/2023  9:30 AM Anson Fret, MD [facility] None   Lab/Order associations:NOT fasting   ICD-10-CM   1. Preventative health care  Z00.00 Comprehensive metabolic panel    CBC with Differential/Platelet    Lipid panel    Vitamin B12    VITAMIN D 25 Hydroxy (Vit-D Deficiency, Fractures)    Hemoglobin A1c    Magnesium    2. Aortic root aneurysm (HCC)  I71.21     3. Migraine with aura and without status migrainosus, not intractable  G43.109 Magnesium    4. Hyperlipidemia, unspecified hyperlipidemia type  E78.5 Comprehensive metabolic panel    CBC with Differential/Platelet    Lipid panel    5. Vitamin D deficiency  E55.9 VITAMIN D 25 Hydroxy (Vit-D Deficiency, Fractures)    6. Vitamin B12 deficiency  E53.8 Vitamin B12    7. Hyperglycemia  R73.9 Hemoglobin A1c    8. Screening for diabetes mellitus  Z13.1 Hemoglobin A1c    9. History of total hysterectomy  Z90.710     10. Screening exam for skin cancer  Z12.83 Ambulatory referral to Dermatology     Meds ordered this encounter  Medications   cyanocobalamin (VITAMIN B12) 1000 MCG/ML injection    Sig: Inject 1000 mcg once per month.    Dispense:  4 mL    Refill:  3    Please provide syringes to inject (contact our office if order needed with preferred syringes)   escitalopram (LEXAPRO) 5 MG tablet  Sig: Take 1.5 tablets (7.5 mg total) by mouth daily.    Dispense:  135 tablet    Refill:  3   Return precautions advised.  Tana Conch, MD

## 2022-10-30 ENCOUNTER — Other Ambulatory Visit: Payer: Self-pay

## 2022-10-30 DIAGNOSIS — E875 Hyperkalemia: Secondary | ICD-10-CM

## 2022-11-06 ENCOUNTER — Other Ambulatory Visit (HOSPITAL_BASED_OUTPATIENT_CLINIC_OR_DEPARTMENT_OTHER): Payer: Self-pay

## 2022-11-06 ENCOUNTER — Other Ambulatory Visit: Payer: Self-pay | Admitting: Family Medicine

## 2022-11-07 ENCOUNTER — Other Ambulatory Visit: Payer: Self-pay

## 2022-11-07 ENCOUNTER — Other Ambulatory Visit (HOSPITAL_BASED_OUTPATIENT_CLINIC_OR_DEPARTMENT_OTHER): Payer: Self-pay

## 2022-11-08 ENCOUNTER — Other Ambulatory Visit (HOSPITAL_BASED_OUTPATIENT_CLINIC_OR_DEPARTMENT_OTHER): Payer: Self-pay

## 2022-11-08 ENCOUNTER — Encounter (HOSPITAL_BASED_OUTPATIENT_CLINIC_OR_DEPARTMENT_OTHER): Payer: Self-pay

## 2022-11-08 MED ORDER — EPINEPHRINE 0.3 MG/0.3ML IJ SOAJ
INTRAMUSCULAR | 0 refills | Status: AC
  Filled 2022-11-08: qty 2, 30d supply, fill #0

## 2022-11-09 ENCOUNTER — Other Ambulatory Visit (HOSPITAL_BASED_OUTPATIENT_CLINIC_OR_DEPARTMENT_OTHER): Payer: Self-pay

## 2022-11-09 ENCOUNTER — Other Ambulatory Visit (INDEPENDENT_AMBULATORY_CARE_PROVIDER_SITE_OTHER): Payer: 59

## 2022-11-09 DIAGNOSIS — E875 Hyperkalemia: Secondary | ICD-10-CM

## 2022-11-09 LAB — BASIC METABOLIC PANEL
BUN: 17 mg/dL (ref 6–23)
CO2: 29 mEq/L (ref 19–32)
Calcium: 9.4 mg/dL (ref 8.4–10.5)
Chloride: 101 mEq/L (ref 96–112)
Creatinine, Ser: 0.78 mg/dL (ref 0.40–1.20)
GFR: 80.57 mL/min (ref >60.00)
Glucose, Bld: 106 mg/dL — ABNORMAL HIGH (ref 70–99)
Potassium: 4.6 mEq/L (ref 3.5–5.1)
Sodium: 138 mEq/L (ref 135–145)

## 2022-11-20 DIAGNOSIS — Z853 Personal history of malignant neoplasm of breast: Secondary | ICD-10-CM | POA: Diagnosis not present

## 2022-11-20 DIAGNOSIS — Z1231 Encounter for screening mammogram for malignant neoplasm of breast: Secondary | ICD-10-CM | POA: Diagnosis not present

## 2022-11-20 DIAGNOSIS — N951 Menopausal and female climacteric states: Secondary | ICD-10-CM | POA: Diagnosis not present

## 2022-11-20 DIAGNOSIS — Z139 Encounter for screening, unspecified: Secondary | ICD-10-CM | POA: Diagnosis not present

## 2022-11-20 DIAGNOSIS — N952 Postmenopausal atrophic vaginitis: Secondary | ICD-10-CM | POA: Diagnosis not present

## 2022-11-20 DIAGNOSIS — Z1211 Encounter for screening for malignant neoplasm of colon: Secondary | ICD-10-CM | POA: Diagnosis not present

## 2022-11-20 DIAGNOSIS — Z01419 Encounter for gynecological examination (general) (routine) without abnormal findings: Secondary | ICD-10-CM | POA: Diagnosis not present

## 2022-11-20 DIAGNOSIS — Z90711 Acquired absence of uterus with remaining cervical stump: Secondary | ICD-10-CM | POA: Diagnosis not present

## 2022-11-23 NOTE — Progress Notes (Unsigned)
[facility] [address] [place] [address] Phone: [phone] Subjective:   [name], am serving as a scribe for Dr. Antoine Primas.  I'm seeing this patient by the request  of:  Shelva Majestic, MD  CC: back and neck pain   [id number]  [name] is a 62 y.o. female coming in with complaint of back and neck pain. OMT 09/14/2022. Patient states that she continues to have pain in lateral hip and glute when walking. Doing a lot of stretching. Has been doing more upper body work and she notes tightness in upper back and chest.   Wants to know if she could get a prescription for singulair. Patient has post nasal drip, hot flashes, nausea, and itching. Feels like her mast cells are have been overactive. Took bendryl yesterday. Is about to get a migraine today and feels that overactive mast cells might be the cause.   Medications patient has been prescribed: Vit D  Taking:         Reviewed prior external information including notes and imaging from previsou exam, outside providers and external EMR if available.   As well as notes that were available from care everywhere and other healthcare systems.  Past medical history, social, surgical and family history all reviewed in electronic medical record.  No pertanent information unless stated regarding to the chief complaint.   Past Medical History:  Diagnosis Date   Adhesive capsulitis of shoulder    Anxiety    Aortic aneurysm (HCC)    Breast cancer (HCC)    Cancer (HCC) 2008   Breast   Congenital anomaly of aortic arch    Labyrinthitis, unspecified    Malignant neoplasm of breast (female), unspecified site    Other B-complex deficiencies    Other malaise and fatigue    Personal history of chemotherapy    Personal history of radiation therapy 2008   Unspecified hearing loss    Unspecified hereditary and idiopathic peripheral neuropathy    Unspecified tinnitus      Allergies  Allergen Reactions   Bee Venom Anaphylaxis   Methylprednisolone Other (See Comments) and Anaphylaxis    Injection only - has polysorbate 80 preservative   Other     kerajinan   Polysorbate Anaphylaxis    "Polysorbate 80 preservative"   Latex Hives   Lyrica [Pregabalin] Nausea And Vomiting and Other (See Comments)    Dizziness    Miralax [Polyethylene Glycol] Hives    pruritus   Doxycycline Hyclate Rash     Review of Systems:  No headache, visual changes, nausea, vomiting, diarrhea, constipation, dizziness, abdominal pain, skin rash, fevers, chills, night sweats, weight loss, swollen lymph nodes, body aches, joint swelling, chest pain, shortness of breath, mood changes. POSITIVE muscle aches  Objective  There were no vitals taken for this visit.   General: No apparent distress alert and oriented x3 mood and affect normal, dressed appropriately.  HEENT: Pupils equal, extraocular movements intact  Respiratory: Patient's speak in full sentences and does not appear short of breath  Cardiovascular: No lower extremity edema, non tender, no erythema  Low back does have some loss lordosis noted.  Patient does have scapular dyskinesis noted.  Patient does have some neuropathy and appears of the lower extremities.  Osteopathic findings  C2 flexed rotated and side bent right C4 flexed rotated and side bent left C6 flexed rotated and side bent left T3 extended rotated and side bent right inhaled rib T8  extended rotated and side bent left inhaled rib L3 flexed rotated and side bent right Sacrum right on right       Assessment and Plan:  No problem-specific Assessment & Plan notes found for this encounter.    Nonallopathic problems  Decision today to treat with OMT was based on Physical Exam  After verbal consent patient was treated with HVLA, ME, FPR techniques in cervical, rib, thoracic, lumbar, and sacral  areas  Patient tolerated the procedure well with  improvement in symptoms  Patient given exercises, stretches and lifestyle modifications  See medications in patient instructions if given  Patient will follow up in 4-8 weeks     The above documentation has been reviewed and is accurate and complete Judi Saa, DO         Note: This dictation was prepared with Dragon dictation along with smaller phrase technology. Any transcriptional errors that result from this process are unintentional.

## 2022-11-26 ENCOUNTER — Encounter: Payer: Self-pay | Admitting: Family Medicine

## 2022-11-26 ENCOUNTER — Other Ambulatory Visit (HOSPITAL_BASED_OUTPATIENT_CLINIC_OR_DEPARTMENT_OTHER): Payer: Self-pay

## 2022-11-26 ENCOUNTER — Other Ambulatory Visit: Payer: Self-pay

## 2022-11-26 ENCOUNTER — Ambulatory Visit: Payer: 59 | Admitting: Family Medicine

## 2022-11-26 VITALS — BP 126/82 | HR 64 | Ht 66.5 in | Wt 134.0 lb

## 2022-11-26 DIAGNOSIS — M9902 Segmental and somatic dysfunction of thoracic region: Secondary | ICD-10-CM | POA: Diagnosis not present

## 2022-11-26 DIAGNOSIS — Z872 Personal history of diseases of the skin and subcutaneous tissue: Secondary | ICD-10-CM | POA: Diagnosis not present

## 2022-11-26 DIAGNOSIS — M9903 Segmental and somatic dysfunction of lumbar region: Secondary | ICD-10-CM

## 2022-11-26 DIAGNOSIS — M9901 Segmental and somatic dysfunction of cervical region: Secondary | ICD-10-CM | POA: Diagnosis not present

## 2022-11-26 DIAGNOSIS — G2589 Other specified extrapyramidal and movement disorders: Secondary | ICD-10-CM

## 2022-11-26 DIAGNOSIS — M9904 Segmental and somatic dysfunction of sacral region: Secondary | ICD-10-CM | POA: Diagnosis not present

## 2022-11-26 MED ORDER — MONTELUKAST SODIUM 10 MG PO TABS
10.0000 mg | ORAL_TABLET | Freq: Every day | ORAL | 0 refills | Status: DC
  Filled 2022-11-26 (×2): qty 30, 30d supply, fill #0

## 2022-11-26 MED ORDER — VITAMIN D (ERGOCALCIFEROL) 1.25 MG (50000 UNIT) PO CAPS
50000.0000 [IU] | ORAL_CAPSULE | ORAL | 0 refills | Status: DC
  Filled 2022-11-26: qty 12, 84d supply, fill #0
  Filled 2022-12-06: qty 4, 28d supply, fill #0
  Filled 2023-01-02: qty 4, 28d supply, fill #1
  Filled 2023-01-31: qty 4, 28d supply, fill #2

## 2022-11-26 NOTE — Patient Instructions (Addendum)
Singulair  Vit D Rx For bday month you need the mountains See me in 8-10 weeks

## 2022-11-26 NOTE — Assessment & Plan Note (Signed)
Singulair prescribed.

## 2022-11-26 NOTE — Assessment & Plan Note (Signed)
Continue to work on the scapular dyskinesis.  Discussed posture and ergonomics.  Discussed that I do believe that some of the histamine release could be potentially contributing and patient would like to try Singulair.  Responds well though to osteopathic manipulation.  Increase activity slowly.  Follow-up again in 6 to 8 weeks.

## 2022-12-06 ENCOUNTER — Other Ambulatory Visit (HOSPITAL_BASED_OUTPATIENT_CLINIC_OR_DEPARTMENT_OTHER): Payer: Self-pay

## 2023-01-02 ENCOUNTER — Other Ambulatory Visit (HOSPITAL_BASED_OUTPATIENT_CLINIC_OR_DEPARTMENT_OTHER): Payer: Self-pay

## 2023-01-03 ENCOUNTER — Other Ambulatory Visit (HOSPITAL_BASED_OUTPATIENT_CLINIC_OR_DEPARTMENT_OTHER): Payer: Self-pay

## 2023-01-03 ENCOUNTER — Other Ambulatory Visit: Payer: Self-pay

## 2023-01-05 ENCOUNTER — Other Ambulatory Visit (HOSPITAL_BASED_OUTPATIENT_CLINIC_OR_DEPARTMENT_OTHER): Payer: Self-pay

## 2023-01-07 ENCOUNTER — Other Ambulatory Visit (HOSPITAL_BASED_OUTPATIENT_CLINIC_OR_DEPARTMENT_OTHER): Payer: Self-pay

## 2023-01-17 NOTE — Progress Notes (Signed)
[facility] [address] [place] [address] Phone: [phone] Subjective:   Bruce Donath, am serving as a scribe for Dr. Antoine Primas.  I'm seeing this patient by the request  of:  Shelva Majestic, MD  CC: Back pain, neck pain and stiffness  [id number]  [name] is a 62 y.o. female coming in with complaint of back and neck pain. OMT 11/26/2022. Patient states that she has been doing well since last visit. A bit stiff today.  Patient states more tightness on the right side of her upper back.  Medications patient has been prescribed: Vit D  Taking:         Reviewed prior external information including notes and imaging from previsou exam, outside providers and external EMR if available.   As well as notes that were available from care everywhere and other healthcare systems.  Past medical history, social, surgical and family history all reviewed in electronic medical record.  No pertanent information unless stated regarding to the chief complaint.   Past Medical History:  Diagnosis Date   Adhesive capsulitis of shoulder    Anxiety    Aortic aneurysm (HCC)    Breast cancer (HCC)    Cancer (HCC) 2008   Breast   Congenital anomaly of aortic arch    Labyrinthitis, unspecified    Malignant neoplasm of breast (female), unspecified site    Other B-complex deficiencies    Other malaise and fatigue    Personal history of chemotherapy    Personal history of radiation therapy 2008   Unspecified hearing loss    Unspecified hereditary and idiopathic peripheral neuropathy    Unspecified tinnitus     Allergies  Allergen Reactions   Bee Venom Anaphylaxis   Methylprednisolone Other (See Comments) and Anaphylaxis    Injection only - has polysorbate 80 preservative   Other     kerajinan   Polysorbate Anaphylaxis    "Polysorbate 80 preservative"   Latex Hives   Lyrica [Pregabalin] Nausea And Vomiting and Other (See  Comments)    Dizziness    Miralax [Polyethylene Glycol] Hives    pruritus   Doxycycline Hyclate Rash     Review of Systems:  No headache, visual changes, nausea, vomiting, diarrhea, constipation, dizziness, abdominal pain, skin rash, fevers, chills, night sweats, weight loss, swollen lymph nodes, body aches, joint swelling, chest pain, shortness of breath, mood changes. POSITIVE muscle aches  Objective  Blood pressure 128/82, pulse 93, height 5' 6.5" (1.689 m), weight 135 lb (61.2 kg), SpO2 92%.   General: No apparent distress alert and oriented x3 mood and affect normal, dressed appropriately.  HEENT: Pupils equal, extraocular movements intact  Respiratory: Patient's speak in full sentences and does not appear short of breath  Cardiovascular: No lower extremity edema, non tender, no erythema  Low back does have some loss of lordosis noted.  Some tenderness to palpation in the paraspinal musculature. Neck exam does have some limited sidebending bilaterally.  Osteopathic findings  C7 flexed rotated and side bent right T3 extended rotated and side bent right inhaled rib T9 extended rotated and side bent left L1 flexed rotated and side bent right Sacrum right on right       Assessment and Plan:  Scapular dyskinesis Continue to work on the scapular dyskinesis.  Patient has had difficulty with migraines and some menopausal syndrome.  Will change patient's Lexapro to Cymbalta to see if this will help with some of the discomfort and  pain.  Patient does have a good primary care provider as well.  Discussed icing regimen and home exercises.  Follow-up again in 8 weeks    Nonallopathic problems  Decision today to treat with OMT was based on Physical Exam  After verbal consent patient was treated with HVLA, ME, FPR techniques in cervical, rib, thoracic, lumbar, and sacral  areas  Patient tolerated the procedure well with improvement in symptoms  Patient given exercises, stretches  and lifestyle modifications  See medications in patient instructions if given  Patient will follow up in 4-8 weeks    The above documentation has been reviewed and is accurate and complete Judi Saa, DO          Note: This dictation was prepared with Dragon dictation along with smaller phrase technology. Any transcriptional errors that result from this process are unintentional.

## 2023-01-21 ENCOUNTER — Encounter: Payer: Self-pay | Admitting: Family Medicine

## 2023-01-21 ENCOUNTER — Other Ambulatory Visit (HOSPITAL_BASED_OUTPATIENT_CLINIC_OR_DEPARTMENT_OTHER): Payer: Self-pay

## 2023-01-21 ENCOUNTER — Ambulatory Visit: Payer: 59 | Admitting: Family Medicine

## 2023-01-21 VITALS — BP 128/82 | HR 93 | Ht 66.5 in | Wt 135.0 lb

## 2023-01-21 DIAGNOSIS — M9908 Segmental and somatic dysfunction of rib cage: Secondary | ICD-10-CM | POA: Diagnosis not present

## 2023-01-21 DIAGNOSIS — M9901 Segmental and somatic dysfunction of cervical region: Secondary | ICD-10-CM | POA: Diagnosis not present

## 2023-01-21 DIAGNOSIS — G2589 Other specified extrapyramidal and movement disorders: Secondary | ICD-10-CM | POA: Diagnosis not present

## 2023-01-21 DIAGNOSIS — M9904 Segmental and somatic dysfunction of sacral region: Secondary | ICD-10-CM | POA: Diagnosis not present

## 2023-01-21 DIAGNOSIS — M9902 Segmental and somatic dysfunction of thoracic region: Secondary | ICD-10-CM | POA: Diagnosis not present

## 2023-01-21 DIAGNOSIS — M9903 Segmental and somatic dysfunction of lumbar region: Secondary | ICD-10-CM

## 2023-01-21 MED ORDER — DULOXETINE HCL 20 MG PO CPEP
20.0000 mg | ORAL_CAPSULE | Freq: Every day | ORAL | 0 refills | Status: DC
  Filled 2023-01-21: qty 30, 30d supply, fill #0

## 2023-01-21 NOTE — Patient Instructions (Signed)
Cymbalta 20mg  stop Lexapro Stay active Will get back to you on DHEA  See me in 2 months

## 2023-01-21 NOTE — Assessment & Plan Note (Signed)
Continue to work on the scapular dyskinesis.  Patient has had difficulty with migraines and some menopausal syndrome.  Will change patient's Lexapro to Cymbalta to see if this will help with some of the discomfort and pain.  Patient does have a good primary care provider as well.  Discussed icing regimen and home exercises.  Follow-up again in 8 weeks

## 2023-02-01 ENCOUNTER — Other Ambulatory Visit (HOSPITAL_BASED_OUTPATIENT_CLINIC_OR_DEPARTMENT_OTHER): Payer: Self-pay

## 2023-02-01 ENCOUNTER — Other Ambulatory Visit: Payer: Self-pay

## 2023-02-01 ENCOUNTER — Other Ambulatory Visit (HOSPITAL_COMMUNITY): Payer: Self-pay

## 2023-02-12 ENCOUNTER — Other Ambulatory Visit: Payer: Self-pay | Admitting: Family Medicine

## 2023-02-12 ENCOUNTER — Other Ambulatory Visit (HOSPITAL_BASED_OUTPATIENT_CLINIC_OR_DEPARTMENT_OTHER): Payer: Self-pay

## 2023-02-12 ENCOUNTER — Other Ambulatory Visit: Payer: Self-pay

## 2023-02-12 MED ORDER — DULOXETINE HCL 20 MG PO CPEP
20.0000 mg | ORAL_CAPSULE | Freq: Every day | ORAL | 0 refills | Status: DC
  Filled 2023-02-12 – 2023-02-15 (×2): qty 30, 30d supply, fill #0

## 2023-02-12 MED ORDER — "BD LUER-LOK SYRINGE 25G X 1"" 3 ML MISC"
3 refills | Status: AC
  Filled 2023-02-12 – 2023-03-15 (×2): qty 3, 84d supply, fill #0

## 2023-02-15 ENCOUNTER — Other Ambulatory Visit (HOSPITAL_BASED_OUTPATIENT_CLINIC_OR_DEPARTMENT_OTHER): Payer: Self-pay

## 2023-02-15 MED ORDER — FLULAVAL 0.5 ML IM SUSY
0.5000 mL | PREFILLED_SYRINGE | Freq: Once | INTRAMUSCULAR | 0 refills | Status: AC
  Filled 2023-02-15: qty 0.5, 1d supply, fill #0

## 2023-02-19 ENCOUNTER — Other Ambulatory Visit (HOSPITAL_BASED_OUTPATIENT_CLINIC_OR_DEPARTMENT_OTHER): Payer: Self-pay

## 2023-02-19 ENCOUNTER — Other Ambulatory Visit: Payer: Self-pay

## 2023-02-20 ENCOUNTER — Other Ambulatory Visit (HOSPITAL_COMMUNITY): Payer: Self-pay

## 2023-02-25 ENCOUNTER — Telehealth: Payer: 59 | Admitting: Neurology

## 2023-02-28 ENCOUNTER — Telehealth: Payer: 59 | Admitting: Neurology

## 2023-03-11 ENCOUNTER — Telehealth: Payer: 59 | Admitting: Neurology

## 2023-03-11 ENCOUNTER — Other Ambulatory Visit: Payer: Self-pay

## 2023-03-11 ENCOUNTER — Other Ambulatory Visit (HOSPITAL_BASED_OUTPATIENT_CLINIC_OR_DEPARTMENT_OTHER): Payer: Self-pay

## 2023-03-11 ENCOUNTER — Encounter: Payer: Self-pay | Admitting: Neurology

## 2023-03-11 DIAGNOSIS — G43009 Migraine without aura, not intractable, without status migrainosus: Secondary | ICD-10-CM | POA: Diagnosis not present

## 2023-03-11 DIAGNOSIS — G500 Trigeminal neuralgia: Secondary | ICD-10-CM

## 2023-03-11 MED ORDER — HYDROCODONE-ACETAMINOPHEN 5-325 MG PO TABS
2.0000 | ORAL_TABLET | ORAL | 0 refills | Status: DC | PRN
  Filled 2023-03-11: qty 56, 7d supply, fill #0

## 2023-03-11 MED ORDER — CYCLOBENZAPRINE HCL 10 MG PO TABS
10.0000 mg | ORAL_TABLET | Freq: Three times a day (TID) | ORAL | 3 refills | Status: AC | PRN
  Filled 2023-03-11: qty 90, 30d supply, fill #0

## 2023-03-11 NOTE — Progress Notes (Signed)
[facility]    Provider:  Dr Lucia Gaskins Referring Provider: Shelva Majestic, MD Primary Care Physician:  Shelva Majestic, MD  Virtual Visit via Video Note  I connected with [name] on 03/11/23 at  9:30 AM EST by a video enabled telemedicine application and verified that I am speaking with the correct person using two identifiers.  Location: Patient: home Provider: office   I discussed the limitations of evaluation and management by telemedicine and the availability of in person appointments. The patient expressed understanding and agreed to proceed.  Follow Up Instructions:    I discussed the assessment and treatment plan with the patient. The patient was provided an opportunity to ask questions and all were answered. The patient agreed with the plan and demonstrated an understanding of the instructions.   The patient was advised to call back or seek an in-person evaluation if the symptoms worsen or if the condition fails to improve as anticipated.  I provided over 30 minutes of non-face-to-face time during this encounter.   Mary Fret, MD  CC:  Atypical face pain, likely trigeminal neuralgia  03/11/2023: f/u trigeminal neuralgia. She is not having it that often. When it starts to kick in she takes a vicoden and at bedtime for the next week she will take 2 gabapentin at bedtime. Changes in the weather make a big difference. Allegra helps with increased pollen she takes one to anticipate a migraine.she reduced products with poysirbate 80 and she has not had a migraine for 2 months. She will go into the hot tub. And she sees dr Terrilee Files and she gets adjusted and loves him. Gabapentin never helped for hot flashes. She spoke to Dr. Terrilee Files and he started her on cymbalta and she feels a little edgy. She needs to keep her BP low. She went on Taurine. We discussed that Taurine is not FDA approved so I cannot give her. Doing better on Migraines. When  get trigeminal neuropathy take 1/2-1 vicodin and gabapentin which is fine, labs are good. Last B12 was > 400 *and she was low in the past 183 several years ago), Vit D 51 excellent. She is a breat cancer survivor and she takes D3 and now on 50,000U a week. She tried lidocaine nasal spray with cardiac problems.     Latest Ref Rng & Units 10/29/2022    8:43 AM 10/23/2021    8:51 AM 01/26/2021    9:00 AM  CBC  WBC 4.0 - 10.5 K/uL 4.9  6.3  6.6   Hemoglobin 12.0 - 15.0 g/dL 38.7  56.4  33.2   Hematocrit 36.0 - 46.0 % 45.4  45.0  43.0   Platelets 150.0 - 400.0 K/uL 311.0  269.0  381.0       Latest Ref Rng & Units 11/09/2022    8:47 AM 10/29/2022    8:43 AM 10/23/2021    8:51 AM  CMP  Glucose 70 - 99 mg/dL 951  89  884   BUN 6 - 23 mg/dL 17  18  20    Creatinine 0.40 - 1.20 mg/dL 1.66  0.63  0.16   Sodium 135 - 145 mEq/L 138  137  140   Potassium 3.5 - 5.1 mEq/L 4.6  5.4 No hemolysis seen  4.5   Chloride 96 - 112 mEq/L 101  101  101   CO2 19 - 32 mEq/L 29  29  28    Calcium 8.4 - 10.5 mg/dL 9.4  9.6  9.4   Total Protein 6.0 - 8.3 g/dL  6.9  6.9   Total Bilirubin 0.2 - 1.2 mg/dL  0.5  0.6   Alkaline Phos 39 - 117 U/L  65  56   AST 0 - 37 U/L  24  21   ALT 0 - 35 U/L  21  18      02/26/2022: She did not do well on Topiramate. She likes flexeril. She likes topical magnesium and heat/ice. If really bad she will do flexeril. She hasnot had the TGN for 6 weeks and when she does the vicodin takes care of it. Migraines have decreased. We discussed non pharmaceutical ways to try to control migraines, and other options like the new CGRP medications. Refillmed meds  Patient complains of symptoms per HPI as well as the following symptoms: migraine,TGN . Pertinent negatives and positives per HPI. All others negative   June 27/2023: She is having more migraines and trigeminal neuralgia in V2/V3 shoots into the face. Having nausea. Migraines are on the right, can last > 4 hours, pulsating/throbbing, also  pain in the right ear, she has light and sound sensitivity, nausea, big shifts in barometric pressure can trigger, she went to rehab to work on neck and shoulders, she is having neck and shoulder pain. She tries topical magnesium, peppermint on her face, she tried flexeril, she takes 1/2 vicodin. She is trying anti-inflammatory herbs. She also has some chronic right ear issues after chemotherapy and she has a hearing aid. Variable she can have 5 migraine days a month and the TGN can be daily or 2x a month. TGN searing shooting pain. She will try going for a walk. We discussed procedures like RFA on the TGN but she is nervous about surgery, we discussed medications. She tried Lyrica and Gabapentin(side effects), flexeril gives her hang over, She has tried baclofen but can't remember what happened.   Patient complains of symptoms per HPI as well as the following symptoms: head pain . Pertinent negatives and positives per HPI. All others negative   Interval history August 15, 2017: Patient returns today with her husband today.  Her husband provides much information.  She continues to have trigeminal neuralgia and is very hesitant to try medications.  She also continues to have myofascial cervical pain which contributes to her headaches and migraines as well as her trigeminal neuralgia.  She is been to physical therapy and she has had dry needling.  She also has dizziness related to her neck pain discussed cervicogenic dizziness today as well.  Right trigeminal neuralgia she uses Vicodin very sparingly, I did discuss the risks of this long-term and we discussed botulinum toxin for trigeminal neuralgia, reviewed the literature and localization but the risks are definitely facial droop as the trigeminal nerve is close to the facial nerve in the area as we would inject Botox for the trigeminal neuralgia.  For her significant cervical fascial pain we discussed Botox injections there as well and all the side effects  which include decreased breathing, death, weakness, difficulty swallowing and other significant side effects.  We will refill her Vicodin.  Also gave her literature on cervicogenic dizziness.  Xeomin SCM 5units bilaterally, Levator Scapulae 5 units bilaterally (posterior to the SCM), 15 units in each of 3 locations in the bilateral trapezius.   Interval history: She has pain 24x7, she is tingling with paresthesias on the right in the V2/V3 distribution, deep at the front of the ear and in the temple and jaw,  her molares hurt and are throbbing. She has been to the dentist. She gets "squiggly lines" in the left eye and starting to have pain on the left side as well. She also has sharp, shooting pain, brief, severe on the right in the V2/V3 distribution. She takes feverfew, CoQ10, cumin seed oil, she takes a B complex with B2 in it. Discussed CoQ10. She has pulsatile Tinnitus in the left ear. She has pain in the right ear so difficult to wear hearing aids. She has negative pressure in both ears. She can't get them to pop. When they pop she can hear well and then she has. Discussed dry needling. Pain in her face is like a hot poker and travels down the V2/v3 area severe, pain is continuous.   HPI:  [name] is a 62 y.o. female here as a referral from Dr. Durene Cal for trigeminal neuralgia.   January 2017 in the setting of stress she had a migraine. She had a migraine and as January went on her right temple started hurting. She had a bite guard made. Starts at the ear very deep and it shoots to the right side of the face to the temple and cheek, diffuse numbness. Felt like her ear was underwater. She tried essential oils, herbs, massage and exercise. She went to a chiropractor. She thought she had an ear infection. It can be very painful. Vicadin helps. Ice/heat helps. She also puts on topical magnesium. Initially the pain was constant, they went to [facility]. She tried Lyrica and did not tolerate. She has  tried gabapentin in the past and she gets dizzy and off balance. She has a lot of side effects to medications. She is trying herbs like Black cumin, curcumin and boswella seed oil which is helping.  She has improved. Now the weather affects it. Not shooting or searing, a dull deep pain. Like Novacaine and ear and temple pain. She is having a migraine. No known triggers such as brushing or wind or eating. It is pounding and throbbing. Light does not bother. No sound sensitivity. Daily medications. She feels it is manageable. No other focal neurologic deficits, associated symptoms, inciting events or modifiable factors.   Reviewed notes, labs and imaging from outside physicians, which showed:   MRI brain : Unusually prominent and asymmetric loop of the RIGHT anterior inferior cerebellar artery projects far into the internal auditory canal, approaching the fundus. Correlate clinically as a cause for pulsatile tinnitus.   Review of Systems: Patient complains of symptoms per HPI as well as the following symptoms: no rash, no lesions, no SOB, no CP. Pertinent negatives and positives per HPI. All others negative.  Social History   Socioeconomic History   Marital status: Married    Spouse name: Not on file   Number of children: 0   Years of education: 12   Highest education level: Not on file  Occupational History   Occupation: HAIR STYLIST    Employer: [employer]   Occupation: vocalist   Occupation: model  Tobacco Use   Smoking status: Never   Smokeless tobacco: Never  Vaping Use   Vaping status: Never Used  Substance and Sexual Activity   Alcohol use: Yes    Comment: wine with dinner a few nights a week   Drug use: No   Sexual activity: Yes  Other Topics Concern   Not on file  Social History Narrative   Married      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 Married '85, no children.    PriorWork - vocalist/muscian, stylist,  colorist, artisan, active in her community around issues of breast cancer awareness. Strong interest in herbology and alternative healing.    Right-handed   Social Determinants of Health   Financial Resource Strain: Not on file  Food Insecurity: Not on file  Transportation Needs: Not on file  Physical Activity: Not on file  Stress: Not on file  Social Connections: Not on file  Intimate Partner Violence: Not on file    Family History  Problem Relation Age of Onset   Seizures Mother    Migraines Mother    Allergic rhinitis Mother    Diabetes Father    Hypertension Father    Stroke Father        several - first stroke late 35s and died 59   Hyperlipidemia Father    Colon polyps Sister    Breast cancer Sister    Cancer Sister        breast   Breast cancer Maternal Grandmother    Cancer Other        breast    Past Medical History:  Diagnosis Date   Adhesive capsulitis of shoulder    Anxiety    Aortic aneurysm (HCC)    Breast cancer (HCC)    Cancer (HCC) 2008   Breast   Congenital anomaly of aortic arch    Labyrinthitis, unspecified    Malignant neoplasm of breast (female), unspecified site    Other B-complex deficiencies    Other malaise and fatigue    Personal history of chemotherapy    Personal history of radiation therapy 2008   Unspecified hearing loss    Unspecified hereditary and idiopathic peripheral neuropathy    Unspecified tinnitus     Past Surgical History:  Procedure Laterality Date   ABDOMINAL HYSTERECTOMY  04/2010   robotic   BASAL CELL CARCINOMA EXCISION  2003   rt eye area   BREAST FIBROADENOMA SURGERY  1981   Left   BREAST LUMPECTOMY Right Oct.29 & Mar 19, 2007   Right   IR GENERIC HISTORICAL  04/16/2016   IR RADIOLOGIST EVAL & MGMT 04/16/2016 [facility]   LAPAROSCOPY  1982   for endometriosis   wisdom teeth extracted      Current Outpatient Medications  Medication Sig Dispense Refill   b complex vitamins tablet Take 1 tablet by  mouth daily.     Coenzyme Q10 (COQ10 PO) Take 100 mg by mouth 2 (two) times daily.     cyanocobalamin (VITAMIN B12) 1000 MCG/ML injection Inject 1 mL (1000 mcg) once per month. 4 mL 3   cyclobenzaprine (FLEXERIL) 10 MG tablet Take 1 tablet (10 mg total) by mouth 3 (three) times daily as needed for muscle spasms. 90 tablet 3   DULoxetine (CYMBALTA) 20 MG capsule Take 1 capsule (20 mg total) by mouth daily. 30 capsule 0   EPINEPHrine 0.3 mg/0.3 mL IJ SOAJ injection INJECT 1 PEN INTO THE MUSCLE FOR ONE DOSE FOR BEE STING ALLERGY AS DIRECTED 2 each 0   fluticasone (FLONASE) 50 MCG/ACT nasal spray Place 2 sprays into both nostrils daily. 16 g 6   gabapentin (NEURONTIN) 100 MG capsule Take 1 capsule (100 mg total) by mouth 3 (three) times daily. 90 capsule 2   HYDROcodone-acetaminophen (NORCO/VICODIN) 5-325 MG tablet Take 2 tablets by mouth every 4 (four) hours as needed for moderate pain (pain score 4-6) (prn).  56 tablet 0   Omega-3 Fatty Acids (FISH OIL) 1000 MG CAPS Take 1,000 mg by mouth 3 (three) times daily.     Probiotic Product (PROBIOTIC DAILY PO) Take by mouth daily.     Saline (SIMPLY SALINE) 0.9 % AERS Place 2 sprays each into the nose daily as needed. 500 mL 1   SYRINGE-NEEDLE, DISP, 3 ML (B-D 3CC LUER-LOK SYR 25GX1") 25G X 1" 3 ML MISC Use with Vitamin B12 injection once a month 3 each 3   Turmeric 500 MG TABS Take 500 mg by mouth daily.     Vitamin D, Ergocalciferol, (DRISDOL) 1.25 MG (50000 UNIT) CAPS capsule Take 1 capsule (50,000 Units total) by mouth every 7 (seven) days. 12 capsule 0   No current facility-administered medications for this visit.    Allergies as of 03/11/2023 - Review Complete 03/11/2023  Allergen Reaction Noted   Bee venom Anaphylaxis 12/14/2014   Methylprednisolone Other (See Comments) and Anaphylaxis    Other     Polysorbate Anaphylaxis 02/28/2012   Latex Hives    Lyrica [pregabalin] Nausea And Vomiting and Other (See Comments) 09/17/2016   Miralax  [polyethylene glycol] Hives 07/09/2019   Doxycycline hyclate Rash 09/17/2016    Vitals: There were no vitals taken for this visit. Last Weight:  Wt Readings from Last 1 Encounters:  01/21/23 135 lb (61.2 kg)   Last Height:   Ht Readings from Last 1 Encounters:  01/21/23 5' 6.5" (1.689 m)    Physical exam: Exam: Gen: NAD, conversant      CV: No palpitations or chest pain or SOB. VS: Breathing at a normal rate. Weight appears within normal limits. Not febrile. Eyes: Conjunctivae clear without exudates or hemorrhage  Neuro: Detailed Neurologic Exam  Speech:    Speech is normal; fluent and spontaneous with normal comprehension.  Cognition:    The patient is oriented to person, place, and time;     recent and remote memory intact;     language fluent;     normal attention, concentration, fund of knowledge Cranial Nerves:    The pupils are equal, round, and reactive to light. Visual fields are full Extraocular movements are intact.  The face is symmetric with normal sensation. The palate elevates in the midline. Hearing intact. Voice is normal. Shoulder shrug is normal. The tongue has normal motion without fasciculations.   Coordination: normal  Gait:    No abnormalities noted or reported  Motor Observation:   no involuntary movements noted. Tone:    Appears normal  Posture:    Posture is normal. normal erect    Strength:    Strength is anti-gravity and symmetric in the upper and lower limbs.      Sensation: intact to LT, no reports of numbness or tingling or paresthesias             Assessment/Plan:  Patient with TGN and migraines also myofascial cervical pain and cervicogenic dizziness. MRI showed a vascular loop possibly compressing the trigeminal nerve at the origin. Things have been going well so we will keep her on vicoden and flexeril, we discussed the cgrps and nurtec and ubrelvy but she has many allergies and reacts to medications poorly so has been heard  to try new things. She has tried feverfew.   -Really lovely patient with trigeminal neuralgia, has the flares occasionally, when it kicks in she takes a Vicodin and at bedtime for the next week she will take gabapentin at bedtime.  Changes in the weather  make a big difference.   -She really tries to be tuned into what causes her migraines, being more careful about taking anything with polysorbate 80 and it has reduced her migraine she has not had one in several months, when she has a migraine or trigeminal neuralgia especially with what other differences she may take Allegra to help with the pollen and go into the hot tub -She has been seeing Dr. Ayesha Mohair who helps her a lot with her musculoskeletal pain.  Gabapentin never helped for hot flashes.  Dr. Katrinka Blazing started on Cymbalta and she feels better. -She went on a medication called tShe went on Taurine. We discussed that Taurine is not FDA approved so I cannot give her details on interactions unfortunately or a fax to liver kidney or other organs.  - labs have been good with essentially normal cbc,cmp, Last B12 was > 400 (she was low in the past 183 several years ago), Vit D 51 excellent. She is a breat cancer survivor and she takes D3 and now on 50,000U a week.  - She tried lidocaine nasal spray for migraines/TGN with cardiac symptoms, do not use again c/I   - Trigeminal neuralgia vs Migraines or both also myofascial cervical pain and cervicogenic dizziness: as above - Discussed dry needling, she did go to PT for this and still does her exercises - She tried Lyric and Gabapentin and flexeril and had significant side effects, recommend Tegretol or Trileptal or Topiramate.Continue Vicodin at this time very sparingly bc Topiramate and baclofen(both caused side effects) - Continue vicodin and flexeril prn - Trigeminal and Trigger point injections, botox in the cervical muscles were other options, again polysorbate and preservaives make it c/i - She is  not interested in decompression therapy for the vascular loop seen on MRI or any other procedures Discussed non-pharmaceuticals: tred cephaly  Non pharmaceutical treatments for migraines  Cefaly    Nerivio     gammaCore SapphireTM is the first and only FDA cleared non-invasive device to treat and prevent multiple types of headache pain via the vagus nerve. It's small, handheld and portable for quick and easy treatments whenever and wherever needed.     E-Neura - [email or web address]     Fascial release tools      Naomie Dean, MD  [facility] [address] [place], [place] [address]  Phone [phone] Fax [phone]

## 2023-03-12 ENCOUNTER — Other Ambulatory Visit (HOSPITAL_BASED_OUTPATIENT_CLINIC_OR_DEPARTMENT_OTHER): Payer: Self-pay

## 2023-03-14 NOTE — Progress Notes (Signed)
[facility] [address] [place] [address] Phone: [phone] Subjective:   INadine Counts, am serving as a scribe for Dr. Antoine Primas.  I'm seeing this patient by the request  of:  Shelva Majestic, MD  CC: Back and neck pain follow-up  IRJ:[id number]  [name] is a 62 y.o. female coming in with complaint of back and neck pain. OMT 01/21/2023. Patient states same per usual. Wants Vit D refill. Obtained yoga roller and fell when using it now right side ribs is hurting.  Medications patient has been prescribed:   Taking:         Reviewed prior external information including notes and imaging from previsou exam, outside providers and external EMR if available.   As well as notes that were available from care everywhere and other healthcare systems.  Past medical history, social, surgical and family history all reviewed in electronic medical record.  No pertanent information unless stated regarding to the chief complaint.   Past Medical History:  Diagnosis Date   Adhesive capsulitis of shoulder    Anxiety    Aortic aneurysm (HCC)    Breast cancer (HCC)    Cancer (HCC) 2008   Breast   Congenital anomaly of aortic arch    Labyrinthitis, unspecified    Malignant neoplasm of breast (female), unspecified site    Other B-complex deficiencies    Other malaise and fatigue    Personal history of chemotherapy    Personal history of radiation therapy 2008   Unspecified hearing loss    Unspecified hereditary and idiopathic peripheral neuropathy    Unspecified tinnitus     Allergies  Allergen Reactions   Bee Venom Anaphylaxis   Methylprednisolone Other (See Comments) and Anaphylaxis    Injection only - has polysorbate 80 preservative   Other     kerajinan   Polysorbate Anaphylaxis    "Polysorbate 80 preservative"   Latex Hives   Lyrica [Pregabalin] Nausea And Vomiting and Other (See Comments)    Dizziness    Miralax  [Polyethylene Glycol] Hives    pruritus   Doxycycline Hyclate Rash     Review of Systems:  No headache, visual changes, nausea, vomiting, diarrhea, constipation, dizziness, abdominal pain, skin rash, fevers, chills, night sweats, weight loss, swollen lymph nodes, body aches, joint swelling, chest pain, shortness of breath, mood changes. POSITIVE muscle aches  Objective  Blood pressure 124/86, pulse 71, height 5\' 6"  (1.676 m), weight 135 lb (61.2 kg), SpO2 96%.   General: No apparent distress alert and oriented x3 mood and affect normal, dressed appropriately.  HEENT: Pupils equal, extraocular movements intact  Respiratory: Patient's speak in full sentences and does not appear short of breath  Cardiovascular: No lower extremity edema, non tender, no erythema  Gait MSK:  Back does have tightness noted in the right side of the parascapular area patient does have some tightness noted in the costal angle on the right side.  Osteopathic findings  C2 flexed rotated and side bent right C6 flexed rotated and side bent left T3 extended rotated and side bent right inhaled rib T9 extended rotated and side bent right inhaled rib L2 flexed rotated and side bent right Sacrum right on right     Assessment and Plan:  Scapular dyskinesis Scapular dyskinesis, patient does have a past medical history is significant for breast cancer as well as radiation.  No masses appreciated.  Discussed with patient about different treatment options.  Discussed that  we should continue to monitor closely.  Discussed that there could be some postsurgical scar tissue that could also be potentially contributing.  Discussed which activities to do and which ones to avoid.  Increase activity slowly otherwise.  Follow-up again in 6-8 weeks     Nonallopathic problems  Decision today to treat with OMT was based on Physical Exam  After verbal consent patient was treated with HVLA, ME, FPR techniques in cervical, rib,  thoracic, lumbar, and sacral  areas  Patient tolerated the procedure well with improvement in symptoms  Patient given exercises, stretches and lifestyle modifications  See medications in patient instructions if given  Patient will follow up in 4-8 weeks    The above documentation has been reviewed and is accurate and complete Judi Saa, DO          Note: This dictation was prepared with Dragon dictation along with smaller phrase technology. Any transcriptional errors that result from this process are unintentional.

## 2023-03-15 ENCOUNTER — Other Ambulatory Visit: Payer: Self-pay | Admitting: Family Medicine

## 2023-03-15 ENCOUNTER — Other Ambulatory Visit (HOSPITAL_BASED_OUTPATIENT_CLINIC_OR_DEPARTMENT_OTHER): Payer: Self-pay

## 2023-03-15 ENCOUNTER — Other Ambulatory Visit: Payer: Self-pay

## 2023-03-18 ENCOUNTER — Ambulatory Visit: Payer: 59 | Admitting: Family Medicine

## 2023-03-18 ENCOUNTER — Encounter: Payer: Self-pay | Admitting: Family Medicine

## 2023-03-18 ENCOUNTER — Other Ambulatory Visit (HOSPITAL_BASED_OUTPATIENT_CLINIC_OR_DEPARTMENT_OTHER): Payer: Self-pay

## 2023-03-18 VITALS — BP 124/86 | HR 71 | Ht 66.0 in | Wt 135.0 lb

## 2023-03-18 DIAGNOSIS — M9903 Segmental and somatic dysfunction of lumbar region: Secondary | ICD-10-CM

## 2023-03-18 DIAGNOSIS — M9908 Segmental and somatic dysfunction of rib cage: Secondary | ICD-10-CM

## 2023-03-18 DIAGNOSIS — G2589 Other specified extrapyramidal and movement disorders: Secondary | ICD-10-CM

## 2023-03-18 DIAGNOSIS — M9901 Segmental and somatic dysfunction of cervical region: Secondary | ICD-10-CM

## 2023-03-18 DIAGNOSIS — M9904 Segmental and somatic dysfunction of sacral region: Secondary | ICD-10-CM | POA: Diagnosis not present

## 2023-03-18 DIAGNOSIS — M9902 Segmental and somatic dysfunction of thoracic region: Secondary | ICD-10-CM | POA: Diagnosis not present

## 2023-03-18 MED ORDER — VITAMIN D (ERGOCALCIFEROL) 1.25 MG (50000 UNIT) PO CAPS
50000.0000 [IU] | ORAL_CAPSULE | ORAL | 0 refills | Status: DC
  Filled 2023-03-18: qty 4, 28d supply, fill #0
  Filled 2023-04-29: qty 4, 28d supply, fill #1
  Filled 2023-05-24: qty 4, 28d supply, fill #2

## 2023-03-18 MED ORDER — DULOXETINE HCL 20 MG PO CPEP
20.0000 mg | ORAL_CAPSULE | Freq: Every day | ORAL | 0 refills | Status: DC
  Filled 2023-03-18: qty 30, 30d supply, fill #0

## 2023-03-18 NOTE — Assessment & Plan Note (Signed)
Scapular dyskinesis, patient does have a past medical history is significant for breast cancer as well as radiation.  No masses appreciated.  Discussed with patient about different treatment options.  Discussed that we should continue to monitor closely.  Discussed that there could be some postsurgical scar tissue that could also be potentially contributing.  Discussed which activities to do and which ones to avoid.  Increase activity slowly otherwise.  Follow-up again in 6-8 weeks

## 2023-03-18 NOTE — Patient Instructions (Addendum)
Refilled prescriptions Send a message in 2 weeks if having more difficulty and we can up medicines Graston tool for scar tissue Find your happy places See you again in 2 months

## 2023-03-22 ENCOUNTER — Other Ambulatory Visit (HOSPITAL_BASED_OUTPATIENT_CLINIC_OR_DEPARTMENT_OTHER): Payer: Self-pay

## 2023-03-22 MED ORDER — COVID-19 MRNA VAC-TRIS(PFIZER) 30 MCG/0.3ML IM SUSY
0.3000 mL | PREFILLED_SYRINGE | Freq: Once | INTRAMUSCULAR | 0 refills | Status: AC
  Filled 2023-03-22: qty 0.3, 1d supply, fill #0

## 2023-04-02 ENCOUNTER — Other Ambulatory Visit (HOSPITAL_BASED_OUTPATIENT_CLINIC_OR_DEPARTMENT_OTHER): Payer: Self-pay

## 2023-04-10 ENCOUNTER — Other Ambulatory Visit: Payer: Self-pay

## 2023-04-10 ENCOUNTER — Other Ambulatory Visit (HOSPITAL_BASED_OUTPATIENT_CLINIC_OR_DEPARTMENT_OTHER): Payer: Self-pay

## 2023-04-10 ENCOUNTER — Encounter: Payer: Self-pay | Admitting: Family Medicine

## 2023-04-10 MED ORDER — DULOXETINE HCL 30 MG PO CPEP
30.0000 mg | ORAL_CAPSULE | Freq: Every day | ORAL | 0 refills | Status: DC
  Filled 2023-04-10: qty 30, 30d supply, fill #0

## 2023-04-16 ENCOUNTER — Other Ambulatory Visit: Payer: Self-pay | Admitting: Family Medicine

## 2023-04-16 ENCOUNTER — Encounter: Payer: Self-pay | Admitting: Family Medicine

## 2023-04-16 DIAGNOSIS — Z0000 Encounter for general adult medical examination without abnormal findings: Secondary | ICD-10-CM

## 2023-04-17 ENCOUNTER — Other Ambulatory Visit: Payer: Self-pay

## 2023-04-17 ENCOUNTER — Other Ambulatory Visit: Payer: Self-pay | Admitting: Family Medicine

## 2023-04-17 DIAGNOSIS — N644 Mastodynia: Secondary | ICD-10-CM

## 2023-04-29 ENCOUNTER — Other Ambulatory Visit (HOSPITAL_BASED_OUTPATIENT_CLINIC_OR_DEPARTMENT_OTHER): Payer: Self-pay

## 2023-05-02 ENCOUNTER — Encounter: Payer: Self-pay | Admitting: Family Medicine

## 2023-05-06 ENCOUNTER — Other Ambulatory Visit (HOSPITAL_BASED_OUTPATIENT_CLINIC_OR_DEPARTMENT_OTHER): Payer: Self-pay

## 2023-05-06 MED ORDER — DULOXETINE HCL 20 MG PO CPEP
20.0000 mg | ORAL_CAPSULE | Freq: Every day | ORAL | 1 refills | Status: DC
  Filled 2023-05-06: qty 30, 30d supply, fill #0
  Filled 2023-06-01: qty 30, 30d supply, fill #1
  Filled 2023-10-29: qty 30, 30d supply, fill #2

## 2023-05-07 ENCOUNTER — Ambulatory Visit: Payer: 59

## 2023-05-07 ENCOUNTER — Ambulatory Visit
Admission: RE | Admit: 2023-05-07 | Discharge: 2023-05-07 | Disposition: A | Discharge status: Home / Self Care | Payer: 59 | Source: Ambulatory Visit | Attending: Family Medicine | Admitting: Family Medicine

## 2023-05-07 DIAGNOSIS — N644 Mastodynia: Secondary | ICD-10-CM | POA: Diagnosis not present

## 2023-05-07 DIAGNOSIS — R92333 Mammographic heterogeneous density, bilateral breasts: Secondary | ICD-10-CM | POA: Diagnosis not present

## 2023-05-08 ENCOUNTER — Other Ambulatory Visit: Payer: Self-pay | Admitting: Surgery

## 2023-05-08 DIAGNOSIS — I7121 Aneurysm of the ascending aorta, without rupture: Secondary | ICD-10-CM

## 2023-05-14 ENCOUNTER — Encounter: Payer: Self-pay | Admitting: Surgery

## 2023-05-16 ENCOUNTER — Ambulatory Visit: Payer: 59 | Admitting: Dermatology

## 2023-05-16 ENCOUNTER — Encounter: Payer: Self-pay | Admitting: Dermatology

## 2023-05-16 VITALS — BP 129/81

## 2023-05-16 DIAGNOSIS — L738 Other specified follicular disorders: Secondary | ICD-10-CM

## 2023-05-16 DIAGNOSIS — Z1283 Encounter for screening for malignant neoplasm of skin: Secondary | ICD-10-CM

## 2023-05-16 DIAGNOSIS — D1801 Hemangioma of skin and subcutaneous tissue: Secondary | ICD-10-CM | POA: Diagnosis not present

## 2023-05-16 DIAGNOSIS — L814 Other melanin hyperpigmentation: Secondary | ICD-10-CM

## 2023-05-16 DIAGNOSIS — W908XXA Exposure to other nonionizing radiation, initial encounter: Secondary | ICD-10-CM

## 2023-05-16 DIAGNOSIS — Z85828 Personal history of other malignant neoplasm of skin: Secondary | ICD-10-CM | POA: Diagnosis not present

## 2023-05-16 DIAGNOSIS — L578 Other skin changes due to chronic exposure to nonionizing radiation: Secondary | ICD-10-CM

## 2023-05-16 DIAGNOSIS — D229 Melanocytic nevi, unspecified: Secondary | ICD-10-CM

## 2023-05-16 DIAGNOSIS — L821 Other seborrheic keratosis: Secondary | ICD-10-CM

## 2023-05-16 NOTE — Progress Notes (Signed)
   New Patient Visit   Subjective  [name] is a 63 y.o. female who presents for the following: Skin Cancer Screening and Full Body Skin Exam - History of BCC of right eye area treated with Mohs in 2003  The patient presents for Total-Body Skin Exam (TBSE) for skin cancer screening and mole check. The patient has spots, moles and lesions to be evaluated, some may be new or changing and the patient may have concern these could be cancer.  The following portions of the chart were reviewed this encounter and updated as appropriate: medications, allergies, medical history  Review of Systems:  No other skin or systemic complaints except as noted in HPI or Assessment and Plan.  Objective  Well appearing patient in no apparent distress; mood and affect are within normal limits.  A full examination was performed including scalp, head, eyes, ears, nose, lips, neck, chest, axillae, abdomen, back, buttocks, bilateral upper extremities, bilateral lower extremities, hands, feet, fingers, toes, fingernails, and toenails. All findings within normal limits unless otherwise noted below.   Relevant physical exam findings are noted in the Assessment and Plan.    Assessment & Plan   SKIN CANCER SCREENING PERFORMED TODAY.  MILD ACTINIC DAMAGE - Chronic condition, secondary to cumulative UV/sun exposure - diffuse scaly erythematous macules with underlying dyspigmentation - Recommend daily broad spectrum sunscreen SPF 30+ to sun-exposed areas, reapply every 2 hours as needed.  - Staying in the shade or wearing long sleeves, sun glasses (UVA+UVB protection) and wide brim hats (4-inch brim around the entire circumference of the hat) are also recommended for sun protection.  - Call for new or changing lesions.  LENTIGINES, SEBORRHEIC KERATOSES, CHERRY ANGIOMAS - Benign normal skin lesions - Benign-appearing - Call for any changes  MELANOCYTIC & Congenital NEVI - Tan-brown and/or  pink-flesh-colored symmetric macules and papules - Benign appearing on exam today - Observation - Call clinic for new or changing moles - Recommend daily use of broad spectrum spf 30+ sunscreen to sun-exposed areas.   Sebaceous Hyperplasia - Small yellow papules with a central dell - Benign-appearing - Observe. Cosmetic appt quoted at $150 to treat. Advised to call to schedule  SKIN EXAM FOR MALIGNANT NEOPLASM   MULTIPLE BENIGN MELANOCYTIC NEVI   CHERRY ANGIOMA   LENTIGINES   SEBORRHEIC KERATOSIS   ACTINIC SKIN DAMAGE   SEBACEOUS HYPERPLASIA OF FACE    Return in about 1 year (around 05/15/2024) for TBSE.  Documentation: I have reviewed the above documentation for accuracy and completeness, and I agree with the above.  Stasia Cavalier, am acting as scribe for Langston Reusing, DO.  Langston Reusing, DO

## 2023-05-16 NOTE — Progress Notes (Signed)
[facility] [address] [place] [address] Phone: [phone] Subjective:   Bruce Donath, am serving as a scribe for Dr. Antoine Primas.  I'm seeing this patient by the request  of:  Shelva Majestic, MD  CC: Back and neck pain follow-up  [id number]  [name] is a 63 y.o. female coming in with complaint of back and neck pain. OMT 03/18/2023. Patient states that her L elbow is starting to bother her. Has been wearing wrist brace at night. Pain over lateral epicondyle.   Doing better with low dose Cymbalta. Felt like she was sweating and on edge when taking higher dose. Takes 20mg  in the morning and she develops some fogginess or "buzzing" in the forehead that lasts all day.   Started yoga recently and has a hard time doing downward dog as it presses against diaphragm. She feels as if organs are shifted upward now causing her to have to shift certain ways to inhale.   Medications patient has been prescribed: Vit D, Cymbalta  Taking: Yes         Reviewed prior external information including notes and imaging from previsou exam, outside providers and external EMR if available.   As well as notes that were available from care everywhere and other healthcare systems.  Past medical history, social, surgical and family history all reviewed in electronic medical record.  No pertanent information unless stated regarding to the chief complaint.   Past Medical History:  Diagnosis Date   Adhesive capsulitis of shoulder    Anxiety    Aortic aneurysm (HCC)    Breast cancer (HCC)    Cancer (HCC) 2008   Breast   Congenital anomaly of aortic arch    Labyrinthitis, unspecified    Malignant neoplasm of breast (female), unspecified site    Other B-complex deficiencies    Other malaise and fatigue    Personal history of chemotherapy    Personal history of radiation therapy 2008   Unspecified hearing loss    Unspecified hereditary and  idiopathic peripheral neuropathy    Unspecified tinnitus     Allergies  Allergen Reactions   Bee Venom Anaphylaxis   Methylprednisolone Other (See Comments) and Anaphylaxis    Injection only - has polysorbate 80 preservative   Other     kerajinan   Polysorbate Anaphylaxis    "Polysorbate 80 preservative"   Latex Hives   Lyrica [Pregabalin] Nausea And Vomiting and Other (See Comments)    Dizziness    Miralax [Polyethylene Glycol] Hives    pruritus   Doxycycline Hyclate Rash     Review of Systems:  No headache, visual changes, nausea, vomiting, diarrhea, constipation, dizziness, abdominal pain, skin rash, fevers, chills, night sweats, weight loss, swollen lymph nodes, body aches, joint swelling, chest pain, shortness of breath, mood changes. POSITIVE muscle aches  Objective  Blood pressure 118/78, height 5\' 6"  (1.676 m), weight 135 lb (61.2 kg).   General: No apparent distress alert and oriented x3 mood and affect normal, dressed appropriately.  HEENT: Pupils equal, extraocular movements intact  Respiratory: Patient's speak in full sentences and does not appear short of breath  Cardiovascular: No lower extremity edema, non tender, no erythema  Gait MSK:  Back does have some loss lordosis noted.  Scapular dyskinesis noted.  Left elbow does have some tenderness to palpation of the lateral epicondylar area.  Osteopathic findings  C3 flexed rotated and side bent right C7 flexed rotated and side bent  left T3 extended rotated and side bent right inhaled rib T9 extended rotated and side bent left L2 flexed rotated and side bent right L3 flexed rotated and side bent left Sacrum right on right       Assessment and Plan:  Scapular dyskinesis Scapular dyskinesis noted.  I am concerned that the patient is having some type of reflux that is also contributing to some of this as well.  Past medical history is significant for right-sided breast cancer..  Do feel that a CT scan of  the abdomen and pelvis would be the next step with the potential for the chest as well.  Patient is following up with pulmonology as well and is going to get some imaging in the near future.  Discussed with patient that we will continue to monitor otherwise.  Does respond relatively well though to osteopathic manipulation.  No change in medication including keeping the Cymbalta at 20 mg daily.  Follow-up again    Nonallopathic problems  Decision today to treat with OMT was based on Physical Exam  After verbal consent patient was treated with HVLA, ME, FPR techniques in cervical, rib, thoracic, lumbar, and sacral  areas  Patient tolerated the procedure well with improvement in symptoms  Patient given exercises, stretches and lifestyle modifications  See medications in patient instructions if given  Patient will follow up in 4-8 weeks    The above documentation has been reviewed and is accurate and complete Judi Saa, DO          Note: This dictation was prepared with Dragon dictation along with smaller phrase technology. Any transcriptional errors that result from this process are unintentional.

## 2023-05-16 NOTE — Patient Instructions (Signed)

## 2023-05-17 ENCOUNTER — Other Ambulatory Visit: Payer: 59

## 2023-05-21 ENCOUNTER — Encounter: Payer: Self-pay | Admitting: Family Medicine

## 2023-05-21 ENCOUNTER — Ambulatory Visit: Payer: 59 | Admitting: Family Medicine

## 2023-05-21 VITALS — BP 118/78 | Ht 66.0 in | Wt 135.0 lb

## 2023-05-21 DIAGNOSIS — M9903 Segmental and somatic dysfunction of lumbar region: Secondary | ICD-10-CM

## 2023-05-21 DIAGNOSIS — M9901 Segmental and somatic dysfunction of cervical region: Secondary | ICD-10-CM

## 2023-05-21 DIAGNOSIS — G2589 Other specified extrapyramidal and movement disorders: Secondary | ICD-10-CM

## 2023-05-21 DIAGNOSIS — M9908 Segmental and somatic dysfunction of rib cage: Secondary | ICD-10-CM

## 2023-05-21 DIAGNOSIS — M9904 Segmental and somatic dysfunction of sacral region: Secondary | ICD-10-CM

## 2023-05-21 DIAGNOSIS — M9902 Segmental and somatic dysfunction of thoracic region: Secondary | ICD-10-CM

## 2023-05-21 NOTE — Assessment & Plan Note (Signed)
Scapular dyskinesis noted.  I am concerned that the patient is having some type of reflux that is also contributing to some of this as well.  Past medical history is significant for right-sided breast cancer..  Do feel that a CT scan of the abdomen and pelvis would be the next step with the potential for the chest as well.  Patient is following up with pulmonology as well and is going to get some imaging in the near future.  Discussed with patient that we will continue to monitor otherwise.  Does respond relatively well though to osteopathic manipulation.  No change in medication including keeping the Cymbalta at 20 mg daily.  Follow-up again

## 2023-05-21 NOTE — Patient Instructions (Addendum)
Thank you for everything You all are wonderful No overhand lifting See what changes See me in 8 weeks

## 2023-05-24 ENCOUNTER — Other Ambulatory Visit: Payer: Self-pay | Admitting: Neurology

## 2023-05-24 ENCOUNTER — Other Ambulatory Visit: Payer: Self-pay

## 2023-05-24 ENCOUNTER — Other Ambulatory Visit (HOSPITAL_BASED_OUTPATIENT_CLINIC_OR_DEPARTMENT_OTHER): Payer: Self-pay

## 2023-05-26 ENCOUNTER — Other Ambulatory Visit (HOSPITAL_BASED_OUTPATIENT_CLINIC_OR_DEPARTMENT_OTHER): Payer: Self-pay

## 2023-05-26 MED ORDER — GABAPENTIN 100 MG PO CAPS
100.0000 mg | ORAL_CAPSULE | Freq: Three times a day (TID) | ORAL | 2 refills | Status: AC
  Filled 2023-05-26: qty 90, 30d supply, fill #0
  Filled 2023-09-28: qty 90, 30d supply, fill #1
  Filled 2023-12-26: qty 90, 30d supply, fill #2

## 2023-05-27 ENCOUNTER — Other Ambulatory Visit (HOSPITAL_BASED_OUTPATIENT_CLINIC_OR_DEPARTMENT_OTHER): Payer: Self-pay

## 2023-05-31 ENCOUNTER — Other Ambulatory Visit (HOSPITAL_BASED_OUTPATIENT_CLINIC_OR_DEPARTMENT_OTHER): Payer: Self-pay

## 2023-05-31 MED ORDER — CAPVAXIVE 0.5 ML IM SOSY
PREFILLED_SYRINGE | INTRAMUSCULAR | 0 refills | Status: DC
  Filled 2023-05-31: qty 0.5, 1d supply, fill #0

## 2023-06-01 ENCOUNTER — Other Ambulatory Visit (HOSPITAL_COMMUNITY): Payer: Self-pay

## 2023-06-01 ENCOUNTER — Other Ambulatory Visit: Payer: Self-pay | Admitting: Family Medicine

## 2023-06-04 ENCOUNTER — Other Ambulatory Visit (HOSPITAL_COMMUNITY): Payer: Self-pay

## 2023-06-04 MED ORDER — VITAMIN D (ERGOCALCIFEROL) 1.25 MG (50000 UNIT) PO CAPS
50000.0000 [IU] | ORAL_CAPSULE | ORAL | 0 refills | Status: DC
  Filled 2023-06-04: qty 12, 84d supply, fill #0
  Filled 2023-06-21: qty 4, 28d supply, fill #0
  Filled 2023-07-17: qty 4, 28d supply, fill #1
  Filled 2023-08-18: qty 4, 28d supply, fill #2

## 2023-06-12 ENCOUNTER — Ambulatory Visit: Payer: 59 | Admitting: Surgery

## 2023-06-12 ENCOUNTER — Ambulatory Visit
Admission: RE | Admit: 2023-06-12 | Discharge: 2023-06-12 | Disposition: A | Discharge status: Home / Self Care | Payer: 59 | Source: Ambulatory Visit | Attending: Pulmonary Disease | Admitting: Pulmonary Disease

## 2023-06-12 ENCOUNTER — Encounter: Payer: Self-pay | Admitting: Surgery

## 2023-06-12 VITALS — BP 146/88 | HR 78 | Resp 18 | Ht 66.0 in | Wt 134.0 lb

## 2023-06-12 DIAGNOSIS — I7121 Aneurysm of the ascending aorta, without rupture: Secondary | ICD-10-CM

## 2023-06-12 DIAGNOSIS — R911 Solitary pulmonary nodule: Secondary | ICD-10-CM

## 2023-06-12 DIAGNOSIS — Z853 Personal history of malignant neoplasm of breast: Secondary | ICD-10-CM | POA: Diagnosis not present

## 2023-06-12 MED ORDER — IOPAMIDOL (ISOVUE-370) INJECTION 76%
75.0000 mL | Freq: Once | INTRAVENOUS | Status: AC | PRN
  Administered 2023-06-12: 75 mL via INTRAVENOUS

## 2023-06-13 NOTE — Progress Notes (Signed)
HPI:  The patient  is a 63 year old woman with a 4.0 cm fusiform ascending aortic aneurysm that was discovered in July 2006 on a breast MRI. She was reportedly referred to [facility] in the past for a medical genetics evaluation to rule out hereditary aneurysm disease and was ruled out for Marfans and Ehlers-Danlos. She reportedly had a second degree relative with Ehlers-Danlos.   I have been following her since August 2016 and the aneurysm has been unchanged in size.  She also has a history of breast cancer diagnosed in 2008 treated with chemotherapy and radiation.  She was noted to have some small lung nodules on CT scan and was seen by Dr. Tonia Brooms last year for evaluation of these.  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  I last did an virtual office visit with her in February 2021.  Since that time she has continued to feel well overall without chest pain.  Current Outpatient Medications  Medication Sig Dispense Refill   b complex vitamins tablet Take 1 tablet by mouth daily.     Coenzyme Q10 (COQ10 PO) Take 100 mg by mouth 2 (two) times daily.     cyanocobalamin (VITAMIN B12) 1000 MCG/ML injection Inject 1 mL (1000 mcg) once per month. 4 mL 3   cyclobenzaprine (FLEXERIL) 10 MG tablet Take 1 tablet (10 mg total) by mouth 3 (three) times daily as needed for muscle spasms. 90 tablet 3   DULoxetine (CYMBALTA) 20 MG capsule Take 1 capsule (20 mg total) by mouth daily. 90 capsule 1   EPINEPHrine 0.3 mg/0.3 mL IJ SOAJ injection INJECT 1 PEN INTO THE MUSCLE FOR ONE DOSE FOR BEE STING ALLERGY AS DIRECTED 2 each 0   gabapentin (NEURONTIN) 100 MG capsule Take 1 capsule (100 mg total) by mouth 3 (three) times daily. 90 capsule 2   HYDROcodone-acetaminophen (NORCO/VICODIN) 5-325 MG tablet Take 2 tablets by mouth every 4 (four) hours as needed for moderate pain (pain score 4-6) (prn). 56 tablet 0   Omega-3 Fatty Acids (FISH  OIL) 1000 MG CAPS Take 1,000 mg by mouth 3 (three) times daily.     pneumococcal 21-valent conjugate vaccine (CAPVAXIVE) 0.5 ML injection Inject into the muscle. 0.5 mL 0   Probiotic Product (PROBIOTIC DAILY PO) Take by mouth daily.     Saline (SIMPLY SALINE) 0.9 % AERS Place 2 sprays each into the nose daily as needed. 500 mL 1   SYRINGE-NEEDLE, DISP, 3 ML (B-D 3CC LUER-LOK SYR 25GX1") 25G X 1" 3 ML MISC Use with Vitamin B12 injection once a month 3 each 3   Turmeric 500 MG TABS Take 500 mg by mouth daily.     Vitamin D, Ergocalciferol, (DRISDOL) 1.25 MG (50000 UNIT) CAPS capsule Take 1 capsule (50,000 Units total) by mouth every 7 (seven) days. 12 capsule 0   No current facility-administered medications for this visit.     Physical Exam: BP (!) 146/88   Pulse 78   Resp 18   Ht 5\' 6"  (1.676 m)   Wt 134 lb (60.8 kg)   SpO2 98% Comment: RA  BMI 21.63 kg/m  She looks well. Cardiac exam shows a regular rate and rhythm with normal heart sounds.  There is no murmur. Lungs are clear.  Diagnostic Tests:  Narrative & Impression  CLINICAL DATA:  Follow-up thoracic aortic aneurysm. Multiple pulmonary nodule surveillance   EXAM: CT ANGIOGRAPHY CHEST WITH  CONTRAST   TECHNIQUE: Multidetector CT imaging of the chest was performed using the standard protocol during bolus administration of intravenous contrast. Multiplanar CT image reconstructions and MIPs were obtained to evaluate the vascular anatomy.   RADIATION DOSE REDUCTION: This exam was performed according to the departmental dose-optimization program which includes automated exposure control, adjustment of the mA and/or kV according to patient size and/or use of iterative reconstruction technique.   CONTRAST:  75mL ISOVUE-370 IOPAMIDOL (ISOVUE-370) INJECTION 76%   COMPARISON:  August 06, 2022   FINDINGS: Cardiovascular: No aortic intramural hemotoma. Preferential opacification of the thoracic aorta. No evidence of thoracic  aortic aneurysm or dissection. Normal heart size. No pericardial effusion.   No evidence of pulmonary embolism.   The ascending aorta measures 3.7 x 3.7 cm proximally and 3.9 by 3.9 cm distally. Aortic arch and descending thoracic aorta are normal.   Mediastinum/Nodes: No mediastinal masses or adenopathy   Lungs/Pleura:   4 mm nodule of the lateral segment of the right lower lobe stable since prior examination image 100   The previously described small 1-2 mm pulmonary nodules on prior examination are not clearly identified on these images.   There is no other pulmonary nodules.   No acute infiltrates or consolidations   Upper Abdomen: Unremarkable   Musculoskeletal: Unremarkable   Review of the MIP images confirms the above findings.   IMPRESSION: *No evidence of thoracic aortic aneurysm or dissection. *Stable 4 mm nodule of the lateral segment of the right lower lobe. Lung-RADS 2, benign appearance or behavior. Continue annual screening with low-dose chest CT without contrast in 12 months. *The previously described small 1-2 mm pulmonary nodules on prior examination are not clearly identified on these images. There is no other pulmonary nodules.     Electronically Signed   By: Shaaron Adler M.D.   On: 06/12/2023 15:40      Impression:  Her aorta is stable at 3.9 cm.  Her descending thoracic aorta is 2.0 cm.  Her ascending aorta has not changed dating back to 2006. Her aorta is still well below the usual surgical threshold of 5.5 cm.  I reviewed the CT images with her and answered all of her questions.  I stressed the importance of continued good blood pressure control in preventing further enlargement and acute aortic dissection.  She also has a stable 4 mm nodule in the lateral segment of the right lower lobe that appears benign.  The other previously seen small pulmonary nodules have resolved.  Plan:  I have recommended that she have a repeat CT scan of the chest  in 5 years for aortic surveillance.  I spent 10 minutes performing this established patient evaluation and > 50% of this time was spent face to face counseling and coordinating the care of this patient's aortic aneurysm.    Alleen Borne, MD [facility] [phone]

## 2023-06-21 ENCOUNTER — Other Ambulatory Visit (HOSPITAL_BASED_OUTPATIENT_CLINIC_OR_DEPARTMENT_OTHER): Payer: Self-pay

## 2023-06-21 ENCOUNTER — Encounter: Payer: Self-pay | Admitting: Family Medicine

## 2023-06-21 ENCOUNTER — Other Ambulatory Visit: Payer: Self-pay

## 2023-06-21 MED ORDER — ESCITALOPRAM OXALATE 10 MG PO TABS
10.0000 mg | ORAL_TABLET | Freq: Every day | ORAL | 1 refills | Status: DC
  Filled 2023-06-21: qty 30, 30d supply, fill #0
  Filled 2023-07-21: qty 30, 30d supply, fill #1
  Filled 2023-08-18: qty 30, 30d supply, fill #2
  Filled 2023-09-17: qty 30, 30d supply, fill #3
  Filled 2023-10-15: qty 30, 30d supply, fill #4

## 2023-06-25 ENCOUNTER — Other Ambulatory Visit (HOSPITAL_BASED_OUTPATIENT_CLINIC_OR_DEPARTMENT_OTHER): Payer: Self-pay

## 2023-06-25 MED ORDER — AMOXICILLIN 500 MG PO CAPS
500.0000 mg | ORAL_CAPSULE | Freq: Three times a day (TID) | ORAL | 0 refills | Status: DC
  Filled 2023-06-25: qty 30, 10d supply, fill #0

## 2023-06-25 MED ORDER — IBUPROFEN 800 MG PO TABS
800.0000 mg | ORAL_TABLET | Freq: Three times a day (TID) | ORAL | 0 refills | Status: DC
  Filled 2023-06-25: qty 30, 10d supply, fill #0

## 2023-07-16 ENCOUNTER — Ambulatory Visit: Payer: 59 | Admitting: Family Medicine

## 2023-07-17 ENCOUNTER — Other Ambulatory Visit: Payer: Self-pay

## 2023-07-22 ENCOUNTER — Other Ambulatory Visit: Payer: Self-pay

## 2023-08-19 ENCOUNTER — Other Ambulatory Visit (HOSPITAL_COMMUNITY): Payer: Self-pay

## 2023-08-28 NOTE — Progress Notes (Signed)
 [facility] [address] [place] [address] Phone: [phone] Subjective:   IBryan Caprio, am serving as a scribe for Dr. Ronnell Coins.  I'm seeing this patient by the request  of:  Almira Jaeger, MD  CC: Neck and back pain follow-up  [id number]  YANILEN GRANADE is a 63 y.o. female coming in with complaint of back and neck pain. OMT 05/21/2023. Patient states same per usual. About 5 weeks ago fell onto knees and wrist. The L side feels out of wack. Gardening now and took some benadryl  and helped joint pain.  Medications patient has been prescribed:   Taking:         Reviewed prior external information including notes and imaging from previsou exam, outside providers and external EMR if available.   As well as notes that were available from care everywhere and other healthcare systems.  Past medical history, social, surgical and family history all reviewed in electronic medical record.  No pertanent information unless stated regarding to the chief complaint.   Past Medical History:  Diagnosis Date   Adhesive capsulitis of shoulder    Anxiety    Aortic aneurysm (HCC)    Breast cancer (HCC)    Cancer (HCC) 2008   Breast   Congenital anomaly of aortic arch    Labyrinthitis, unspecified    Malignant neoplasm of breast (female), unspecified site    Other B-complex deficiencies    Other malaise and fatigue    Personal history of chemotherapy    Personal history of radiation therapy 2008   Unspecified hearing loss    Unspecified hereditary and idiopathic peripheral neuropathy    Unspecified tinnitus     Allergies  Allergen Reactions   Bee Venom Anaphylaxis   Methylprednisolone  Other (See Comments) and Anaphylaxis    Injection only - has polysorbate 80 preservative   Other     Carageenen   Polysorbate Anaphylaxis    "Polysorbate 80 preservative" + PEG 20 Plus   Latex Hives   Lyrica  [Pregabalin ] Nausea And Vomiting  and Other (See Comments)    Dizziness    Miralax [Polyethylene Glycol] Hives    pruritus   Doxycycline  Hyclate Rash     Review of Systems:  No headache, visual changes, nausea, vomiting, diarrhea, constipation, dizziness, abdominal pain, skin rash, fevers, chills, night sweats, weight loss, swollen lymph nodes, body aches, joint swelling, chest pain, shortness of breath, mood changes. POSITIVE muscle aches  Objective  Pulse 62, height 5\' 6"  (1.676 m), weight 134 lb (60.8 kg), SpO2 98%.   General: No apparent distress alert and oriented x3 mood and affect normal, dressed appropriately.  HEENT: Pupils equal, extraocular movements intact  Respiratory: Patient's speak in full sentences and does not appear short of breath  Cardiovascular: No lower extremity edema, non tender, no erythema  Gait MSK:  Back does have some loss of lordosis.  There are some tightness noted in the parascapular area.  Tightness noted with sidebending of the neck.  Osteopathic findings  C2 flexed rotated and side bent right C6 flexed rotated and side bent left T3 extended rotated and side bent right inhaled rib T9 extended rotated and side bent left L2 flexed rotated and side bent right L4 flexed rotated and side bent left Sacrum right on right       Assessment and Plan:  Scapular dyskinesis Continue to have some scapular dyskinesis noted.  Discussed with patient with icing regimen of home exercises.  Continue  to work on posture neurodynamic's.  Has been doing quite some improvement overall especially with her doing a lot more yard work recently.  Follow-up again in 2 to 3 months  History of urticaria Singulair  prescribed again which did help patient previously.    Nonallopathic problems  Decision today to treat with OMT was based on Physical Exam  After verbal consent patient was treated with HVLA, ME, FPR techniques in cervical, rib, thoracic, lumbar, and sacral  areas  Patient tolerated the  procedure well with improvement in symptoms  Patient given exercises, stretches and lifestyle modifications  See medications in patient instructions if given  Patient will follow up in 8-12 weeks    The above documentation has been reviewed and is accurate and complete Cobe Viney M Pebbles Zeiders, DO          Note: This dictation was prepared with Dragon dictation along with smaller phrase technology. Any transcriptional errors that result from this process are unintentional.

## 2023-08-29 ENCOUNTER — Ambulatory Visit: Admitting: Family Medicine

## 2023-08-29 ENCOUNTER — Encounter: Payer: Self-pay | Admitting: Family Medicine

## 2023-08-29 ENCOUNTER — Other Ambulatory Visit (HOSPITAL_BASED_OUTPATIENT_CLINIC_OR_DEPARTMENT_OTHER): Payer: Self-pay

## 2023-08-29 VITALS — HR 62 | Ht 66.0 in | Wt 134.0 lb

## 2023-08-29 DIAGNOSIS — M9902 Segmental and somatic dysfunction of thoracic region: Secondary | ICD-10-CM | POA: Diagnosis not present

## 2023-08-29 DIAGNOSIS — G2589 Other specified extrapyramidal and movement disorders: Secondary | ICD-10-CM | POA: Diagnosis not present

## 2023-08-29 DIAGNOSIS — M9901 Segmental and somatic dysfunction of cervical region: Secondary | ICD-10-CM

## 2023-08-29 DIAGNOSIS — Z872 Personal history of diseases of the skin and subcutaneous tissue: Secondary | ICD-10-CM | POA: Diagnosis not present

## 2023-08-29 DIAGNOSIS — M9904 Segmental and somatic dysfunction of sacral region: Secondary | ICD-10-CM | POA: Diagnosis not present

## 2023-08-29 DIAGNOSIS — M9903 Segmental and somatic dysfunction of lumbar region: Secondary | ICD-10-CM

## 2023-08-29 DIAGNOSIS — M9908 Segmental and somatic dysfunction of rib cage: Secondary | ICD-10-CM | POA: Diagnosis not present

## 2023-08-29 MED ORDER — MONTELUKAST SODIUM 10 MG PO TABS
10.0000 mg | ORAL_TABLET | Freq: Every day | ORAL | 0 refills | Status: AC
  Filled 2023-08-29: qty 30, 30d supply, fill #0

## 2023-08-29 NOTE — Assessment & Plan Note (Signed)
 Singulair  prescribed again which did help patient previously.

## 2023-08-29 NOTE — Patient Instructions (Addendum)
 Do prescribed exercises at least 3x a week Singulair  prescribed See you again in 2 months

## 2023-08-29 NOTE — Assessment & Plan Note (Signed)
 Continue to have some scapular dyskinesis noted.  Discussed with patient with icing regimen of home exercises.  Continue to work on posture neurodynamic's.  Has been doing quite some improvement overall especially with her doing a lot more yard work recently.  Follow-up again in 2 to 3 months

## 2023-09-17 ENCOUNTER — Other Ambulatory Visit: Payer: Self-pay

## 2023-09-17 ENCOUNTER — Other Ambulatory Visit: Payer: Self-pay | Admitting: Neurology

## 2023-09-17 ENCOUNTER — Other Ambulatory Visit (HOSPITAL_BASED_OUTPATIENT_CLINIC_OR_DEPARTMENT_OTHER): Payer: Self-pay

## 2023-09-17 ENCOUNTER — Other Ambulatory Visit: Payer: Self-pay | Admitting: Family Medicine

## 2023-09-17 MED ORDER — VITAMIN D (ERGOCALCIFEROL) 1.25 MG (50000 UNIT) PO CAPS
50000.0000 [IU] | ORAL_CAPSULE | ORAL | 0 refills | Status: DC
  Filled 2023-09-17: qty 4, 28d supply, fill #0
  Filled 2023-10-15: qty 4, 28d supply, fill #1
  Filled 2023-11-13: qty 4, 28d supply, fill #2

## 2023-09-18 NOTE — Telephone Encounter (Signed)
 Last seen 03/11/23 Next visit TBD Last fill:

## 2023-09-19 ENCOUNTER — Other Ambulatory Visit (HOSPITAL_BASED_OUTPATIENT_CLINIC_OR_DEPARTMENT_OTHER): Payer: Self-pay

## 2023-09-19 ENCOUNTER — Other Ambulatory Visit: Payer: Self-pay

## 2023-09-19 MED ORDER — HYDROCODONE-ACETAMINOPHEN 5-325 MG PO TABS
2.0000 | ORAL_TABLET | ORAL | 0 refills | Status: AC | PRN
  Filled 2023-09-19: qty 56, 7d supply, fill #0

## 2023-09-28 ENCOUNTER — Other Ambulatory Visit (HOSPITAL_COMMUNITY): Payer: Self-pay

## 2023-10-01 ENCOUNTER — Other Ambulatory Visit: Payer: Self-pay | Admitting: Obstetrics and Gynecology

## 2023-10-01 DIAGNOSIS — Z0000 Encounter for general adult medical examination without abnormal findings: Secondary | ICD-10-CM

## 2023-10-15 ENCOUNTER — Other Ambulatory Visit: Payer: Self-pay

## 2023-10-28 NOTE — Progress Notes (Unsigned)
 [name] [address] [place] [address] Phone: [phone] Subjective:   [name], am serving as a scribe for Dr. Arthea Claudene.  I'm seeing this patient by the request  of:  Katrinka Garnette KIDD, MD  CC: Back and neck pain follow-up  YEP:Dlagzrupcz  [name] is a 63 y.o. female coming in with complaint of back and neck pain. OMT 08/29/2023. Patient states that neck musculature is tight. Headaches occurring more frequently. Got a new pillow and her neck is in a different position which is making her neck somewhat sore.   Feels like spine is out of alignment which caused her to trip over curb. Client recommended cranial-sacral work.   Patient would like to try Cymbalta  again as she is very achy.   Medications patient has been prescribed: Lexapro  Vit D, singulair   Taking:         Reviewed prior external information including notes and imaging from previsou exam, outside providers and external EMR if available.   As well as notes that were available from care everywhere and other healthcare systems.  Past medical history, social, surgical and family history all reviewed in electronic medical record.  No pertanent information unless stated regarding to the chief complaint.   Past Medical History:  Diagnosis Date   Adhesive capsulitis of shoulder    Anxiety    Aortic aneurysm (HCC)    Breast cancer (HCC)    Cancer (HCC) 2008   Breast   Congenital anomaly of aortic arch    Labyrinthitis, unspecified    Malignant neoplasm of breast (female), unspecified site    Other B-complex deficiencies    Other malaise and fatigue    Personal history of chemotherapy    Personal history of radiation therapy 2008   Unspecified hearing loss    Unspecified hereditary and idiopathic peripheral neuropathy    Unspecified tinnitus     Allergies  Allergen Reactions   Bee Venom Anaphylaxis   Methylprednisolone  Other (See Comments) and  Anaphylaxis    Injection only - has polysorbate 80 preservative   Other     Carageenen   Polysorbate Anaphylaxis    Polysorbate 80 preservative + PEG 20 Plus   Latex Hives   Lyrica  [Pregabalin ] Nausea And Vomiting and Other (See Comments)    Dizziness    Miralax [Polyethylene Glycol] Hives    pruritus   Doxycycline  Hyclate Rash     Review of Systems:  No headache, visual changes, nausea, vomiting, diarrhea, constipation, dizziness, abdominal pain, skin rash, fevers, chills, night sweats, weight loss, swollen lymph nodes, body aches, joint swelling, chest pain, shortness of breath, mood changes. POSITIVE muscle aches  Objective  Blood pressure 102/76, pulse (!) 101, height 5' 6 (1.676 m), weight 138 lb (62.6 kg), SpO2 98%.   General: No apparent distress alert and oriented x3 mood and affect normal, dressed appropriately.  HEENT: Pupils equal, extraocular movements intact  Respiratory: Patient's speak in full sentences and does not appear short of breath  Cardiovascular: No lower extremity edema, non tender, no erythema  Gait MSK:  Back does have some loss lordosis noted.  Some tenderness to palpation in the paraspinal musculature.  Does have some scapular dyskinesis noted on the right side  Osteopathic findings  C2 flexed rotated and side bent right C3 flexed rotated and side bent left C6 flexed rotated and side bent left T3 extended rotated and side bent right inhaled rib T9 extended rotated and side bent  right with inhaled rib L2 flexed rotated and side bent right Sacrum right on right       Assessment and Plan:  Scapular dyskinesis Continue to work on posture and ergonomics.  Gave patient some information on craniosacral therapies.  Start the Cymbalta  which patient was not doing previously and do 20 mg daily.  Warned of potential side effects but patient should do relatively well.  Hopeful that this will make some improvement.  Knows not to use any other SSRIs.   Follow-up with me again in 6 to 8 weeks otherwise    Nonallopathic problems  Decision today to treat with OMT was based on Physical Exam  After verbal consent patient was treated with HVLA, ME, FPR techniques in cervical, rib, thoracic, lumbar, and sacral  areas  Patient tolerated the procedure well with improvement in symptoms  Patient given exercises, stretches and lifestyle modifications  See medications in patient instructions if given  Patient will follow up in 4-8 weeks             Note: This dictation was prepared with Dragon dictation along with smaller phrase technology. Any transcriptional errors that result from this process are unintentional.

## 2023-10-29 ENCOUNTER — Ambulatory Visit: Admitting: Family Medicine

## 2023-10-29 ENCOUNTER — Encounter: Payer: Self-pay | Admitting: Family Medicine

## 2023-10-29 ENCOUNTER — Other Ambulatory Visit: Payer: Self-pay

## 2023-10-29 VITALS — BP 102/76 | HR 101 | Ht 66.0 in | Wt 138.0 lb

## 2023-10-29 DIAGNOSIS — G2589 Other specified extrapyramidal and movement disorders: Secondary | ICD-10-CM

## 2023-10-29 DIAGNOSIS — M9901 Segmental and somatic dysfunction of cervical region: Secondary | ICD-10-CM | POA: Diagnosis not present

## 2023-10-29 DIAGNOSIS — M9908 Segmental and somatic dysfunction of rib cage: Secondary | ICD-10-CM | POA: Diagnosis not present

## 2023-10-29 DIAGNOSIS — M9902 Segmental and somatic dysfunction of thoracic region: Secondary | ICD-10-CM | POA: Diagnosis not present

## 2023-10-29 DIAGNOSIS — M9903 Segmental and somatic dysfunction of lumbar region: Secondary | ICD-10-CM | POA: Diagnosis not present

## 2023-10-29 DIAGNOSIS — M9904 Segmental and somatic dysfunction of sacral region: Secondary | ICD-10-CM | POA: Diagnosis not present

## 2023-10-29 NOTE — Assessment & Plan Note (Signed)
 Continue to work on [employer].  Gave patient some information on craniosacral therapies.  Start the Cymbalta  which patient was not doing previously and do 20 mg daily.  Warned of potential side effects but patient should do relatively well.  Hopeful that this will make some improvement.  Knows not to use any other SSRIs.  Follow-up with me again in 6 to 8 weeks otherwise

## 2023-10-29 NOTE — Patient Instructions (Signed)
 Great to see you See me the end of August

## 2023-11-04 ENCOUNTER — Ambulatory Visit: Payer: Self-pay | Admitting: Family Medicine

## 2023-11-04 ENCOUNTER — Encounter: Payer: Self-pay | Admitting: Family Medicine

## 2023-11-04 ENCOUNTER — Ambulatory Visit (INDEPENDENT_AMBULATORY_CARE_PROVIDER_SITE_OTHER): Payer: 59 | Admitting: Family Medicine

## 2023-11-04 VITALS — BP 118/78 | HR 76 | Temp 98.1°F | Ht 66.0 in | Wt 137.2 lb

## 2023-11-04 DIAGNOSIS — R739 Hyperglycemia, unspecified: Secondary | ICD-10-CM | POA: Diagnosis not present

## 2023-11-04 DIAGNOSIS — E785 Hyperlipidemia, unspecified: Secondary | ICD-10-CM | POA: Diagnosis not present

## 2023-11-04 DIAGNOSIS — E559 Vitamin D deficiency, unspecified: Secondary | ICD-10-CM

## 2023-11-04 DIAGNOSIS — Z0000 Encounter for general adult medical examination without abnormal findings: Secondary | ICD-10-CM | POA: Diagnosis not present

## 2023-11-04 DIAGNOSIS — Z131 Encounter for screening for diabetes mellitus: Secondary | ICD-10-CM | POA: Diagnosis not present

## 2023-11-04 DIAGNOSIS — E538 Deficiency of other specified B group vitamins: Secondary | ICD-10-CM | POA: Diagnosis not present

## 2023-11-04 LAB — COMPREHENSIVE METABOLIC PANEL WITH GFR
ALT: 17 U/L (ref 0–35)
AST: 21 U/L (ref 0–37)
Albumin: 4.6 g/dL (ref 3.5–5.2)
Alkaline Phosphatase: 58 U/L (ref 39–117)
BUN: 12 mg/dL (ref 6–23)
CO2: 29 meq/L (ref 19–32)
Calcium: 9.2 mg/dL (ref 8.4–10.5)
Chloride: 102 meq/L (ref 96–112)
Creatinine, Ser: 0.76 mg/dL (ref 0.40–1.20)
GFR: 82.54 mL/min (ref >60.00)
Glucose, Bld: 90 mg/dL (ref 70–99)
Potassium: 4.3 meq/L (ref 3.5–5.1)
Sodium: 138 meq/L (ref 135–145)
Total Bilirubin: 0.5 mg/dL (ref 0.2–1.2)
Total Protein: 7.2 g/dL (ref 6.0–8.3)

## 2023-11-04 LAB — LIPID PANEL
Cholesterol: 199 mg/dL (ref 0–200)
HDL: 52.7 mg/dL (ref >39.00)
LDL Cholesterol: 108 mg/dL — ABNORMAL HIGH (ref 0–99)
NonHDL: 146.13
Total CHOL/HDL Ratio: 4
Triglycerides: 192 mg/dL — ABNORMAL HIGH (ref 0.0–149.0)
VLDL: 38.4 mg/dL (ref 0.0–40.0)

## 2023-11-04 LAB — CBC WITH DIFFERENTIAL/PLATELET
Basophils Absolute: 0.1 K/uL (ref 0.0–0.1)
Basophils Relative: 1.1 % (ref 0.0–3.0)
Eosinophils Absolute: 0.2 K/uL (ref 0.0–0.7)
Eosinophils Relative: 3.7 % (ref 0.0–5.0)
HCT: 44.4 % (ref 36.0–46.0)
Hemoglobin: 14.9 g/dL (ref 12.0–15.0)
Lymphocytes Relative: 23.9 % (ref 12.0–46.0)
Lymphs Abs: 1.3 K/uL (ref 0.7–4.0)
MCHC: 33.5 g/dL (ref 30.0–36.0)
MCV: 90.2 fl (ref 78.0–100.0)
Monocytes Absolute: 0.4 K/uL (ref 0.1–1.0)
Monocytes Relative: 7.5 % (ref 3.0–12.0)
Neutro Abs: 3.6 K/uL (ref 1.4–7.7)
Neutrophils Relative %: 63.8 % (ref 43.0–77.0)
Platelets: 314 K/uL (ref 150.0–400.0)
RBC: 4.93 Mil/uL (ref 3.87–5.11)
RDW: 13 % (ref 11.5–15.5)
WBC: 5.6 K/uL (ref 4.0–10.5)

## 2023-11-04 LAB — VITAMIN B12: Vitamin B-12: 369 pg/mL (ref 211–911)

## 2023-11-04 LAB — VITAMIN D 25 HYDROXY (VIT D DEFICIENCY, FRACTURES): VITD: 47.07 ng/mL (ref 30.00–100.00)

## 2023-11-04 LAB — HEMOGLOBIN A1C: Hgb A1c MFr Bld: 6 % (ref 4.6–6.5)

## 2023-11-04 NOTE — Progress Notes (Signed)
 Phone [phone]   Subjective:  Patient presents today for their annual physical. Chief complaint-noted.   See problem oriented charting- ROS- full  review of systems was completed and negative Per full ROS sheet completed by patient except for topics noted under acute/chronic concerns  The following were reviewed and entered/updated in epic: Past Medical History:  Diagnosis Date   Adhesive capsulitis of shoulder    Anxiety    Aortic aneurysm (HCC)    Breast cancer (HCC)    Cancer (HCC) 2008   Breast   Congenital anomaly of aortic arch    Labyrinthitis, unspecified    Malignant neoplasm of breast (female), unspecified site    Other B-complex deficiencies    Other malaise and fatigue    Personal history of chemotherapy    Personal history of radiation therapy 2008   Unspecified hearing loss    Unspecified hereditary and idiopathic peripheral neuropathy    Unspecified tinnitus    Patient Active Problem List   Diagnosis Date Noted   Trigeminal neuralgia of right side of face 09/30/2016    Priority: High   Migraine with aura and without status migrainosus 09/30/2016    Priority: High   Abnormal MRA, brain 05/15/2016    Priority: High   Aortic root aneurysm 05/03/2016    Priority: High   History of right breast cancer 08/23/2014    Priority: High   Tinnitus 01/10/2010    Priority: High   Vitamin D  deficiency 10/11/2017    Priority: Medium    Hyperlipidemia 11/28/2010    Priority: Medium    Hearing loss 01/10/2010    Priority: Medium    Vitamin B12 deficiency 01/09/2010    Priority: Medium    History of total hysterectomy 10/14/2019    Priority: Low   Malignant neoplasm of skin 08/11/2019    Priority: Low   Breast lump 08/11/2019    Priority: Low   Drug-induced anaphylaxis 07/09/2019    Priority: Low   Allergy to hymenoptera venom 07/09/2019    Priority: Low   Lipoma 02/12/2019    Priority: Low   Bee sting-induced anaphylaxis 10/11/2017    Priority: Low    Sensorineural hearing loss (SNHL) of right ear 05/15/2016    Priority: Low   Dyspnea 12/14/2014    Priority: Low   Other allergic rhinitis 12/14/2014    Priority: Low   Genetic testing 12/08/2014    Priority: Low   Loss of transverse plantar arch 07/16/2014    Priority: Low   Hereditary and idiopathic peripheral neuropathy 01/10/2010    Priority: Low   History of skin cancer 05/26/2007    Priority: Low   Scapular dyskinesis 08/07/2022    Priority: 1.   Somatic dysfunction of spine, thoracic 08/07/2022    Priority: 1.   Partial tear of common extensor tendon of right elbow 03/14/2022    Priority: 1.   Menopausal syndrome 12/21/2021    Priority: 1.   History of urticaria 07/09/2019    Priority: 1.   Past Surgical History:  Procedure Laterality Date   ABDOMINAL HYSTERECTOMY  04/2010   robotic   BASAL CELL CARCINOMA EXCISION  2003   rt eye area   BREAST FIBROADENOMA SURGERY  1981   Left   BREAST LUMPECTOMY Right Oct.29 & Mar 19, 2007   Right   IR GENERIC HISTORICAL  04/16/2016   IR RADIOLOGIST EVAL & MGMT 04/16/2016 [facility]   LAPAROSCOPY  1982   for endometriosis   wisdom teeth extracted  Family History  Problem Relation Age of Onset   Seizures Mother    Migraines Mother    Allergic rhinitis Mother    Diabetes Father    Hypertension Father    Stroke Father        several - first stroke late 61s and died 93   Hyperlipidemia Father    Colon polyps Sister    Breast cancer Sister    Cancer Sister        breast   Breast cancer Maternal Grandmother    Cancer Other        breast    Medications- reviewed and updated Current Outpatient Medications  Medication Sig Dispense Refill   b complex vitamins tablet Take 1 tablet by mouth daily.     Coenzyme Q10 (COQ10 PO) Take 100 mg by mouth 2 (two) times daily.     cyanocobalamin  (VITAMIN B12) 1000 MCG/ML injection Inject 1 mL (1000 mcg) once per month. 4 mL 3   cyclobenzaprine  (FLEXERIL ) 10 MG tablet Take  1 tablet (10 mg total) by mouth 3 (three) times daily as needed for muscle spasms. 90 tablet 3   DULoxetine  (CYMBALTA ) 20 MG capsule Take 1 capsule (20 mg total) by mouth daily. 90 capsule 1   EPINEPHrine  0.3 mg/0.3 mL IJ SOAJ injection INJECT 1 PEN INTO THE MUSCLE FOR ONE DOSE FOR BEE STING ALLERGY AS DIRECTED 2 each 0   escitalopram  (LEXAPRO ) 10 MG tablet Take 1 tablet (10 mg total) by mouth daily. 90 tablet 1   gabapentin  (NEURONTIN ) 100 MG capsule Take 1 capsule (100 mg total) by mouth 3 (three) times daily. 90 capsule 2   HYDROcodone -acetaminophen  (NORCO/VICODIN) 5-325 MG tablet Take 2 tablets by mouth every 4 (four) hours as needed for moderate pain (pain score 4-6) (prn). 56 tablet 0   montelukast  (SINGULAIR ) 10 MG tablet Take 1 tablet (10 mg total) by mouth at bedtime. 90 tablet 0   Omega-3 Fatty Acids (FISH OIL) 1000 MG CAPS Take 1,000 mg by mouth 3 (three) times daily.     Probiotic Product (PROBIOTIC DAILY PO) Take by mouth daily.     Saline (SIMPLY SALINE) 0.9 % AERS Place 2 sprays each into the nose daily as needed. 500 mL 1   SYRINGE-NEEDLE, DISP, 3 ML (B-D 3CC LUER-LOK SYR 25GX1) 25G X 1 3 ML MISC Use with Vitamin B12 injection once a month 3 each 3   Turmeric 500 MG TABS Take 500 mg by mouth daily.     Vitamin D , Ergocalciferol , (DRISDOL ) 1.25 MG (50000 UNIT) CAPS capsule Take 1 capsule (50,000 Units total) by mouth every 7 (seven) days. 12 capsule 0   amoxicillin  (AMOXIL ) 500 MG capsule Take 1 capsule (500 mg total) by mouth 3 (three) times daily. 30 capsule 0   pneumococcal 21-valent conjugate vaccine (CAPVAXIVE ) 0.5 ML injection Inject into the muscle. 0.5 mL 0   No current facility-administered medications for this visit.    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 Married '85, no children.    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 Strong interest in herbology and alternative healing.    Right-handed   Objective  Objective:  BP 118/78   Pulse 76   Temp 98.1 F (36.7 C)   Ht 5' 6 (1.676 m)   Wt 137 lb 3.2 oz (62.2 kg)   SpO2 98%   BMI 22.14 kg/m  Gen: NAD, resting comfortably HEENT: Mucous membranes are moist. Oropharynx normal Neck: no thyromegaly CV: RRR no murmurs rubs or gallops Lungs: CTAB no crackles, wheeze, rhonchi Abdomen: soft/nontender/nondistended/normal bowel sounds. No rebound or guarding.  Ext: no edema Skin: warm, dry Neuro: grossly normal, moves all extremities, PERRLA   Assessment and Plan   63 y.o. female presenting for annual physical.  Health Maintenance counseling: 1. Anticipatory guidance: Patient counseled regarding regular dental exams -q6 months, eye exams-yearly with floaters now,  avoiding smoking and second hand smoke, limiting alcohol to 1 beverage per day-4 times a week with dinner , no illicit drugs .   2. Risk factor reduction:  Advised patient of need for regular exercise and diet rich and fruits and vegetables to reduce risk of heart attack and stroke.  Exercise- walking outside less with pollen/allergy issues. Treadmill 3 days a week. Yoga once a week. Gardening multiple days a week multiple hours- may try to do more cardio Diet/weight management-has maintained very healthy weight and continues healthy diet Wt Readings from Last 3 Encounters:   11/04/23 137 lb 3.2 oz (62.2 kg)  10/29/23 138 lb (62.6 kg)  08/29/23 134 lb (60.8 kg)  3. Immunizations/screenings/ancillary studies-plans on fall COVID shot along with flu shot  Immunization History  Administered Date(s) Administered   Influenza Inj Mdck Quad Pf 02/05/2018   Influenza Whole 03/04/2008, 02/26/2010   Influenza, Seasonal, Injecte, Preservative Fre 02/15/2023   Influenza,inj,Quad PF,6+ Mos 01/28/2014, 01/10/2017, 02/05/2019, 02/02/2020, 01/15/2022   Influenza,trivalent, recombinat, inj, PF 02/24/2015   Influenza-Unspecified 03/03/2013, 01/25/2016, 02/05/2018, 03/31/2021   PFIZER Comirnaty (Gray Top)Covid-19 Tri-Sucrose Vaccine 07/12/2020, 02/03/2022   PFIZER(Purple Top)SARS-COV-2 Vaccination 12/28/2019, 01/18/2020   Pfizer Covid-19 Vaccine Bivalent Booster 40yrs & up 04/14/2021   Pfizer(Comirnaty )Fall Seasonal Vaccine 12 years and older 03/22/2023   Pneumococcal Conjugate Pcv21, Polysaccharide Crm197 Conjugaf 05/31/2023   Td 03/04/2008   Tdap 10/13/2018   Zoster Recombinant(Shingrix) 05/14/2018, 07/15/2018, 12/29/2018   Zoster, Live 12/26/2018  4. Cervical cancer screening- continues to see GYN-history of total hysterectomy for benign reasons-they decide on Pap smears 5. Breast cancer history and follow-up with history of right breast cancer-  breast exam with GYN and mammogram 05/07/23 6. Colon cancer screening - November 25, 2014 with 10-year follow-up 7. Skin cancer screening-referred to Dr. Alm last year- good fit. advised regular sunscreen use. Denies worrisome, changing, or new skin lesions.  8. Birth control/STD check-hysterectomy in monogamous 9. Osteoporosis screening at 41- had done with oncology in the past and we have considered repeat when turns 63 10. Smoking associated screening -never smoker  Status of chronic or acute concerns   #hyperlipidemia- lpa not elevated and   ct cardiac 11/15/21 of 0  S: Medication:none  Lab Results  Component Value Date   CHOL  206 (H) 10/29/2022   HDL 53.00 10/29/2022   LDLCALC 129 (H) 10/29/2022   LDLDIRECT 110.1  04/26/2011   TRIG 123.0 10/29/2022   CHOLHDL 4 10/29/2022  A/P: lipids mildly elevated but reassuring CT calcium- hold off on interventions for now - maybe repeat scan 2028 or could look at other scans for calcium  #Right sided rib pain- can get a click if pulls it a certain way. Ct angio 06/12/23 with upper abdomen unrremarkable. Bothering her slightly more lately. At leats 7 years of issues. Normal us  abdomen 08/01/17  # Anxiety/possible pain benefit- hot flashes and muscle aches S:Medication: Cymbalta  20 mg daily started by Dr. Claudene for scapular dyskinesia about 6 days ago (30 felt like too much in past)-also on Lexapro  10 mg in past (still on list until she's firm going to be be able to tolerate Cymbalta ) Control: reasonable A/P: overall stable other than issues in news as trigger- husband trying to pull back on watching news to help  -meditating as well and doing yoga weekly  # Migraines and history trigeminal neuralgia right sided- sees neurology Dr. Ines S:Medication:  hydrocodone  available as needed- half tablet every 4 hours as needed on bad trigeminal neuralgia, also on gabapentin  100 mg 3 times daily as needed but bothers her memory - uses topical magnesium and peppermint as well -more headaches since the spring (more weather changes seem to trigger such as more runs) More aura lately. Still mainly over right eye and sinuses/ear canal- occasionally triggers trigeminal neuralgia -some neck tension- has tried new pillow in last week - tried some Flonase   (tried for a month twice a day) and some claritin as well as nasal saline- but not helping.  -bad experience with dry needling- felt like electrical shock and was really bothersome -tried nonpharmacological options like cefaly and nerivio but without benefit. Also going to try a new treatment for her neck A/P: with recent migraine flares hoping  that the Cymbalta  may be beneficial but if not would like for her to return to Dr. Ines sooner than november -post nasal drip. Simple saline 2-3x a day. Tried off Flonase  but worsened so will restart -discussed possible antibiotic(s) but we opted out of this -felt agitated on singulair . Also opted out of prednisone .   # Aortic root aneurysm- follow up with cardiothoracic surgery- last seen  06/12/2023 and from that note I have recommended that she have a repeat CT scan of the chest in 5 years for aortic surveillance.  # B12 deficiency S: Current treatment/medication (oral vs. IM):  b complex plus 1mcg injections monthly in past- less consistent lately Lab Results  Component Value Date   VITAMINB12 411 10/29/2022  A/P: less consistent lately- update b12   #Vitamin D  deficiency- she prefers higher end of range S: Medication: 2000 units daily but has required high dose in the past Last vitamin D  Lab Results  Component Value Date   VD25OH 51.17 10/29/2022  A/P: hopefully stable- update vitamin d  today. Continue current meds for now    #hyperglycemia- capillary blood glucose 101 in past- will get a1c again- mild elevations but stable in past  #lung nodules- follow up with Dr. Brenna 08/13/22 - will be checked on 5 year scan vs CT calcium  Recommended follow up: Return in about 1 year (around 11/03/2024) for physical or sooner if needed.Schedule b4 you leave. Future Appointments  Date Time Provider Department Center  12/23/2023 10:15 AM Claudene Arthea HERO, DO [facility] None  05/14/2024  3:15 PM Alm Delon SAILOR, DO [facility] None   Lab/Order associations:NOT fasting- black coffee at 6 am, 8 oz  carnation instant breakfast   ICD-10-CM   1. Preventative health care  Z00.00     2. Hyperlipidemia, unspecified hyperlipidemia type  E78.5     3. Vitamin D  deficiency  E55.9     4. Vitamin B12 deficiency  E53.8     5. Hyperglycemia  R73.9     6. Screening for diabetes mellitus  Z13.1       No  orders of the defined types were placed in this encounter.   Return precautions advised.  Garnette Lukes, MD

## 2023-11-04 NOTE — Patient Instructions (Addendum)
 with recent migraine flares hoping that the Cymbalta  may be beneficial but if not would like for her to return to Dr. Ines sooner than November  Could try xyzal- double check for polysorbate 80 with your allergies  Please stop by lab before you go If you have mychart- we will send your results within 3 business days of us  receiving them.  If you do not have mychart- we will call you about results within 5 business days of us  receiving them.  *please also note that you will see labs on mychart as soon as they post. I will later go in and write notes on them- will say notes from Dr. Katrinka   Recommended follow up: Return in about 1 year (around 11/03/2024) for physical or sooner if needed.Schedule b4 you leave.

## 2023-11-06 ENCOUNTER — Other Ambulatory Visit: Payer: Self-pay

## 2023-11-06 DIAGNOSIS — E785 Hyperlipidemia, unspecified: Secondary | ICD-10-CM

## 2023-11-06 DIAGNOSIS — E559 Vitamin D deficiency, unspecified: Secondary | ICD-10-CM

## 2023-11-06 DIAGNOSIS — E538 Deficiency of other specified B group vitamins: Secondary | ICD-10-CM

## 2023-11-13 ENCOUNTER — Other Ambulatory Visit (HOSPITAL_BASED_OUTPATIENT_CLINIC_OR_DEPARTMENT_OTHER): Payer: Self-pay

## 2023-11-21 DIAGNOSIS — Z1231 Encounter for screening mammogram for malignant neoplasm of breast: Secondary | ICD-10-CM | POA: Diagnosis not present

## 2023-11-21 DIAGNOSIS — Z90710 Acquired absence of both cervix and uterus: Secondary | ICD-10-CM | POA: Diagnosis not present

## 2023-11-21 DIAGNOSIS — Z1211 Encounter for screening for malignant neoplasm of colon: Secondary | ICD-10-CM | POA: Diagnosis not present

## 2023-11-21 DIAGNOSIS — Z01419 Encounter for gynecological examination (general) (routine) without abnormal findings: Secondary | ICD-10-CM | POA: Diagnosis not present

## 2023-11-21 DIAGNOSIS — Z13820 Encounter for screening for osteoporosis: Secondary | ICD-10-CM | POA: Diagnosis not present

## 2023-11-21 DIAGNOSIS — Z1330 Encounter for screening examination for mental health and behavioral disorders, unspecified: Secondary | ICD-10-CM | POA: Diagnosis not present

## 2023-11-27 ENCOUNTER — Encounter: Payer: Self-pay | Admitting: Family Medicine

## 2023-11-28 ENCOUNTER — Other Ambulatory Visit: Payer: Self-pay

## 2023-11-28 ENCOUNTER — Other Ambulatory Visit (HOSPITAL_BASED_OUTPATIENT_CLINIC_OR_DEPARTMENT_OTHER): Payer: Self-pay

## 2023-11-28 MED ORDER — ESCITALOPRAM OXALATE 10 MG PO TABS
10.0000 mg | ORAL_TABLET | Freq: Every day | ORAL | 0 refills | Status: DC
  Filled 2023-11-28: qty 30, 30d supply, fill #0
  Filled 2023-12-03: qty 90, 90d supply, fill #0

## 2023-12-03 ENCOUNTER — Other Ambulatory Visit (HOSPITAL_BASED_OUTPATIENT_CLINIC_OR_DEPARTMENT_OTHER): Payer: Self-pay

## 2023-12-23 ENCOUNTER — Ambulatory Visit: Admitting: Family Medicine

## 2023-12-26 ENCOUNTER — Other Ambulatory Visit: Payer: Self-pay

## 2023-12-26 ENCOUNTER — Other Ambulatory Visit (HOSPITAL_COMMUNITY): Payer: Self-pay

## 2023-12-26 ENCOUNTER — Other Ambulatory Visit: Payer: Self-pay | Admitting: Family Medicine

## 2023-12-26 MED ORDER — VITAMIN D (ERGOCALCIFEROL) 1.25 MG (50000 UNIT) PO CAPS
50000.0000 [IU] | ORAL_CAPSULE | ORAL | 0 refills | Status: DC
  Filled 2023-12-26: qty 12, 84d supply, fill #0

## 2023-12-31 DIAGNOSIS — H903 Sensorineural hearing loss, bilateral: Secondary | ICD-10-CM | POA: Diagnosis not present

## 2024-01-02 ENCOUNTER — Telehealth: Payer: Self-pay | Admitting: Neurology

## 2024-01-02 NOTE — Telephone Encounter (Signed)
 Pt scheduled a f/u with wait list

## 2024-01-06 ENCOUNTER — Other Ambulatory Visit (HOSPITAL_BASED_OUTPATIENT_CLINIC_OR_DEPARTMENT_OTHER): Payer: Self-pay | Admitting: Obstetrics and Gynecology

## 2024-01-06 DIAGNOSIS — Z13820 Encounter for screening for osteoporosis: Secondary | ICD-10-CM

## 2024-01-16 NOTE — Progress Notes (Unsigned)
 [name] [address] [place] [address] Phone: [phone] Subjective:   [name], am serving as a scribe for Dr. Arthea Claudene.  I'm seeing this patient by the request  of:  Katrinka Garnette KIDD, MD  CC: back and neck pain   YEP:Mary  [name] is a 63 y.o. female coming in with complaint of back and neck pain. OMT 10/29/2023. Discontinue Cymbalta  and restarted Lexapro . Patient states needed today. Neck and traps are very tight. Suboccipitals also really tight.  Medications patient has been prescribed: Lexapro , Vit D  Taking:         Reviewed prior external information including notes and imaging from previsou exam, outside providers and external EMR if available.   As well as notes that were available from care everywhere and other healthcare systems.  Past medical history, social, surgical and family history all reviewed in electronic medical record.  No pertanent information unless stated regarding to the chief complaint.   Past Medical History:  Diagnosis Date   Adhesive capsulitis of shoulder    Anxiety    Aortic aneurysm (HCC)    Breast cancer (HCC)    Cancer (HCC) 2008   Breast   Congenital anomaly of aortic arch    Labyrinthitis, unspecified    Malignant neoplasm of breast (female), unspecified site    Other B-complex deficiencies    Other malaise and fatigue    Personal history of chemotherapy    Personal history of radiation therapy 2008   Unspecified hearing loss    Unspecified hereditary and idiopathic peripheral neuropathy    Unspecified tinnitus     Allergies  Allergen Reactions   Bee Venom Anaphylaxis   Methylprednisolone  Other (See Comments) and Anaphylaxis    Injection only - has polysorbate 80 preservative   Other     Carageenen   Polysorbate Anaphylaxis    Polysorbate 80 preservative + PEG 20 Plus   Latex Hives   Lyrica  [Pregabalin ] Nausea And Vomiting and Other (See Comments)     Dizziness    Miralax [Polyethylene Glycol] Hives    pruritus   Doxycycline  Hyclate Rash     Review of Systems:  No  visual changes, nausea, vomiting, diarrhea, constipation, dizziness, abdominal pain, skin rash, fevers, chills, night sweats, weight loss, swollen lymph nodes, body aches, joint swelling, chest pain, shortness of breath, mood changes. POSITIVE muscle aches, headache  Objective  Blood pressure 126/86, pulse 70, height 5' 6 (1.676 m), weight 138 lb (62.6 kg), SpO2 98%.   General: No apparent distress alert and oriented x3 mood and affect normal, dressed appropriately.  HEENT: Pupils equal, extraocular movements intact  Respiratory: Patient's speak in full sentences and does not appear short of breath  Cardiovascular: No lower extremity edema, non tender, no erythema  Gait relatively normal MSK:  Back does have some loss of lordosis.  Significant tightness noted though in the occipital area.  Seems to be more right greater than left.  Negative Spurling's noted today.  Lacks last 5 degrees of flexion and extension.  Scapular dyskinesis noted on the right side.  Osteopathic findings  C2 flexed rotated and side bent right T3 extended rotated and side bent right inhaled rib T9 extended rotated and side bent right with inhaled rib L2 flexed rotated and side bent right Sacrum right on right       Assessment and Plan:  Scapular dyskinesis Scapular dyskinesis and cervicogenic headaches.  Discussed with patient different different posture  and ergonomics, discussed which activities to do and which ones to avoid.  Increase activity slowly.  Increase activity slowly.  Follow-up again in 6 to 8 weeks.    Nonallopathic problems  Decision today to treat with OMT was based on Physical Exam  After verbal consent patient was treated with HVLA, ME, FPR techniques in cervical, rib, thoracic, lumbar, and sacral  areas  Patient tolerated the procedure well with improvement in  symptoms  Patient given exercises, stretches and lifestyle modifications  See medications in patient instructions if given  Patient will follow up in 4-8 weeks     The above documentation has been reviewed and is accurate and complete Mary Mehlhaff M Janyra Barillas, DO         Note: This dictation was prepared with Dragon dictation along with smaller phrase technology. Any transcriptional errors that result from this process are unintentional.

## 2024-01-17 ENCOUNTER — Encounter: Payer: Self-pay | Admitting: Family Medicine

## 2024-01-17 ENCOUNTER — Ambulatory Visit: Admitting: Family Medicine

## 2024-01-17 ENCOUNTER — Other Ambulatory Visit (HOSPITAL_BASED_OUTPATIENT_CLINIC_OR_DEPARTMENT_OTHER): Payer: Self-pay

## 2024-01-17 ENCOUNTER — Other Ambulatory Visit: Payer: Self-pay

## 2024-01-17 ENCOUNTER — Other Ambulatory Visit: Payer: Self-pay | Admitting: Family Medicine

## 2024-01-17 VITALS — BP 126/86 | HR 70 | Ht 66.0 in | Wt 138.0 lb

## 2024-01-17 DIAGNOSIS — M9904 Segmental and somatic dysfunction of sacral region: Secondary | ICD-10-CM

## 2024-01-17 DIAGNOSIS — M9903 Segmental and somatic dysfunction of lumbar region: Secondary | ICD-10-CM

## 2024-01-17 DIAGNOSIS — M9901 Segmental and somatic dysfunction of cervical region: Secondary | ICD-10-CM | POA: Diagnosis not present

## 2024-01-17 DIAGNOSIS — G2589 Other specified extrapyramidal and movement disorders: Secondary | ICD-10-CM

## 2024-01-17 DIAGNOSIS — M9902 Segmental and somatic dysfunction of thoracic region: Secondary | ICD-10-CM | POA: Diagnosis not present

## 2024-01-17 DIAGNOSIS — M9908 Segmental and somatic dysfunction of rib cage: Secondary | ICD-10-CM | POA: Diagnosis not present

## 2024-01-17 MED ORDER — ESCITALOPRAM OXALATE 5 MG PO TABS
5.0000 mg | ORAL_TABLET | Freq: Every day | ORAL | 0 refills | Status: DC
  Filled 2024-01-17: qty 30, 30d supply, fill #0

## 2024-01-17 NOTE — Patient Instructions (Signed)
 Good to see you! Try more meditation for valgus nerve CoQ10 200-400mg  daily Look up [employer] See you again in 6-8 weeks

## 2024-01-17 NOTE — Assessment & Plan Note (Signed)
 Scapular dyskinesis and cervicogenic headaches.  Discussed with patient different different posture and ergonomics, discussed which activities to do and which ones to avoid.  Increase activity slowly.  Increase activity slowly.  Follow-up again in 6 to 8 weeks.

## 2024-01-18 ENCOUNTER — Encounter: Payer: Self-pay | Admitting: Surgery

## 2024-01-22 ENCOUNTER — Encounter: Payer: Self-pay | Admitting: Family Medicine

## 2024-01-24 ENCOUNTER — Encounter: Payer: Self-pay | Admitting: Family Medicine

## 2024-02-03 ENCOUNTER — Other Ambulatory Visit

## 2024-02-05 ENCOUNTER — Telehealth: Payer: Self-pay

## 2024-02-05 NOTE — Telephone Encounter (Signed)
 Called and left voicemail for patient to reschedule appointment on 1/20 with Dr Ines.  If patient calls back, they can be rescheduled with Dr Onita

## 2024-02-20 ENCOUNTER — Other Ambulatory Visit: Payer: Self-pay | Admitting: Family Medicine

## 2024-02-20 ENCOUNTER — Other Ambulatory Visit (HOSPITAL_BASED_OUTPATIENT_CLINIC_OR_DEPARTMENT_OTHER): Payer: Self-pay

## 2024-02-20 ENCOUNTER — Other Ambulatory Visit: Payer: Self-pay

## 2024-02-20 MED ORDER — VITAMIN D (ERGOCALCIFEROL) 1.25 MG (50000 UNIT) PO CAPS
50000.0000 [IU] | ORAL_CAPSULE | ORAL | 0 refills | Status: AC
  Filled 2024-02-20 – 2024-03-17 (×2): qty 12, 84d supply, fill #0

## 2024-02-20 MED ORDER — ESCITALOPRAM OXALATE 10 MG PO TABS
10.0000 mg | ORAL_TABLET | Freq: Every day | ORAL | 0 refills | Status: DC
  Filled 2024-02-20: qty 90, 90d supply, fill #0

## 2024-02-20 MED ORDER — FLUZONE HIGH-DOSE 0.5 ML IM SUSY
0.5000 mL | PREFILLED_SYRINGE | Freq: Once | INTRAMUSCULAR | 0 refills | Status: AC
  Filled 2024-02-20: qty 0.5, 1d supply, fill #0

## 2024-02-20 MED ORDER — ESCITALOPRAM OXALATE 5 MG PO TABS
5.0000 mg | ORAL_TABLET | Freq: Every day | ORAL | 0 refills | Status: AC
  Filled 2024-02-20: qty 45, 45d supply, fill #0

## 2024-02-27 NOTE — Progress Notes (Unsigned)
 [name] [address] [place] [address] Phone: [phone] Subjective:   [name], am serving as a scribe for Dr. Arthea Claudene.  I'm seeing this patient by the request  of:  Mary Garnette KIDD, MD  CC: Back and neck pain follow-up  YEP:Dlagzrupcz  [name] is a 63 y.o. female coming in with complaint of back and neck pain. OMT 01/17/2024. Patient states that she has improved. Strength training using machines. Has soreness in back of c spine. When she does lat pulls she feels deep knots in scapula.   Medications patient has been prescribed: Lexapro , Vit D  Taking:         Reviewed prior external information including notes and imaging from previsou exam, outside providers and external EMR if available.   As well as notes that were available from care everywhere and other healthcare systems.  Past medical history, social, surgical and family history all reviewed in electronic medical record.  No pertanent information unless stated regarding to the chief complaint.   Past Medical History:  Diagnosis Date   Adhesive capsulitis of shoulder    Anxiety    Aortic aneurysm    Breast cancer (HCC)    Cancer (HCC) 2008   Breast   Congenital anomaly of aortic arch    Labyrinthitis, unspecified    Malignant neoplasm of breast (female), unspecified site    Other B-complex deficiencies    Other malaise and fatigue    Personal history of chemotherapy    Personal history of radiation therapy 2008   Unspecified hearing loss    Unspecified hereditary and idiopathic peripheral neuropathy    Unspecified tinnitus     Allergies  Allergen Reactions   Bee Venom Anaphylaxis   Methylprednisolone  Other (See Comments) and Anaphylaxis    Injection only - has polysorbate 80 preservative   Other     Carageenen   Polysorbate Anaphylaxis    Polysorbate 80 preservative + PEG 20 Plus   Latex Hives   Lyrica  [Pregabalin ] Nausea And  Vomiting and Other (See Comments)    Dizziness    Miralax [Polyethylene Glycol] Hives    pruritus   Doxycycline  Hyclate Rash     Review of Systems:  No headache, visual changes, nausea, vomiting, diarrhea, constipation, dizziness, abdominal pain, skin rash, fevers, chills, night sweats, weight loss, swollen lymph nodes, body aches, joint swelling, chest pain, shortness of breath, mood changes. POSITIVE muscle aches  Objective  Blood pressure (!) 140/90, pulse 75, height 5' 6 (1.676 m), weight 138 lb (62.6 kg), SpO2 98%.   General: No apparent distress alert and oriented x3 mood and affect normal, dressed appropriately.  HEENT: Pupils equal, extraocular movements intact  Respiratory: Patient's speak in full sentences and does not appear short of breath  Cardiovascular: No lower extremity edema, non tender, no erythema  Gait MSK:  Back does have some loss lordosis noted.  Neck exam does have some limited sidebending bilaterally.  Significant tightness noted in the parascapular area.  Seems to be worse right greater than left.  Osteopathic findings  C2 flexed rotated and side bent right C6 flexed rotated and side bent left T3 extended rotated and side bent right inhaled rib T9 extended rotated and side bent left L2 flexed rotated and side bent right Sacrum right on right      Assessment and Plan:  Scapular dyskinesis Continues to have scapular dyskinesis, discussed keeping hands within peripheral vision.  Will see if anybody  does any type of cupping but patient wants to avoid dry needling.  Discussed the other ergonomics that would be helpful.  No other change in medications at this point.  Increase activity slowly.  Follow-up again in 6 to 8 weeks.    Nonallopathic problems  Decision today to treat with OMT was based on Physical Exam  After verbal consent patient was treated with HVLA, ME, FPR techniques in cervical, rib, thoracic, lumbar, and sacral  areas  Patient  tolerated the procedure well with improvement in symptoms  Patient given exercises, stretches and lifestyle modifications  See medications in patient instructions if given  Patient will follow up in 4-8 weeks    The above documentation has been reviewed and is accurate and complete [name], DO          Note: This dictation was prepared with Dragon dictation along with smaller phrase technology. Any transcriptional errors that result from this process are unintentional.

## 2024-02-28 ENCOUNTER — Ambulatory Visit: Admitting: Family Medicine

## 2024-02-28 ENCOUNTER — Encounter: Payer: Self-pay | Admitting: Family Medicine

## 2024-02-28 VITALS — BP 140/90 | HR 75 | Ht 66.0 in | Wt 138.0 lb

## 2024-02-28 DIAGNOSIS — G2589 Other specified extrapyramidal and movement disorders: Secondary | ICD-10-CM | POA: Diagnosis not present

## 2024-02-28 DIAGNOSIS — M9904 Segmental and somatic dysfunction of sacral region: Secondary | ICD-10-CM | POA: Diagnosis not present

## 2024-02-28 DIAGNOSIS — M9908 Segmental and somatic dysfunction of rib cage: Secondary | ICD-10-CM

## 2024-02-28 DIAGNOSIS — M9903 Segmental and somatic dysfunction of lumbar region: Secondary | ICD-10-CM

## 2024-02-28 DIAGNOSIS — M9901 Segmental and somatic dysfunction of cervical region: Secondary | ICD-10-CM

## 2024-02-28 DIAGNOSIS — M9902 Segmental and somatic dysfunction of thoracic region: Secondary | ICD-10-CM | POA: Diagnosis not present

## 2024-02-28 NOTE — Patient Instructions (Signed)
 Good to see you! Cupping for PT Try the massage gun and vert ball Keep hands in peripheral vision See you again in 2-3 months

## 2024-02-28 NOTE — Assessment & Plan Note (Signed)
 Continues to have scapular dyskinesis, discussed keeping hands within peripheral vision.  Will see if anybody does any type of cupping but patient wants to avoid dry needling.  Discussed the other ergonomics that would be helpful.  No other change in medications at this point.  Increase activity slowly.  Follow-up again in 6 to 8 weeks.

## 2024-03-16 ENCOUNTER — Encounter: Payer: Self-pay | Admitting: Family Medicine

## 2024-03-16 NOTE — Telephone Encounter (Signed)
 Okay to refer?

## 2024-03-17 ENCOUNTER — Other Ambulatory Visit (HOSPITAL_COMMUNITY): Payer: Self-pay

## 2024-03-17 ENCOUNTER — Other Ambulatory Visit: Payer: Self-pay

## 2024-04-13 ENCOUNTER — Other Ambulatory Visit (HOSPITAL_BASED_OUTPATIENT_CLINIC_OR_DEPARTMENT_OTHER): Payer: Self-pay

## 2024-04-13 MED ORDER — AREXVY 120 MCG/0.5ML IM SUSR
0.5000 mL | Freq: Once | INTRAMUSCULAR | 0 refills | Status: AC
  Filled 2024-04-13: qty 0.5, 1d supply, fill #0

## 2024-04-28 NOTE — Progress Notes (Unsigned)
 " Darlyn Claudene [name] [address] [place] [address] Phone: [phone] Subjective:   [name] Berwyn Posey, am serving as a scribe for Dr. Arthea Claudene.  I'm seeing this patient by the request  of:  Katrinka Garnette KIDD, MD  CC: Back and neck pain  YEP:Dlagzrupcz  JUDIANN CELIA is a 63 y.o. female coming in with complaint of back and neck pain. OMT 02/28/2024. Patient states that she has good and bad days. Doing machine weights at the gym. Having tightness over R trap and into the occiput and she is wondering if she is building muscle over adhesions. Wants to know if there are deep trigger points. Discontinued upper body work due to pain. Does continue to do yoga. Has tried different pillows. Patient has been using massage pillow which seems to be helpful.   Medications patient has been prescribed: Lexapro , Vit D  Taking:       ASK ABOUT THE CT OF CHEST DUE IN FEB  Reviewed prior external information including notes and imaging from previsou exam, outside providers and external EMR if available.   As well as notes that were available from care everywhere and other healthcare systems.  Past medical history, social, surgical and family history all reviewed in electronic medical record.  No pertanent information unless stated regarding to the chief complaint.   Past Medical History:  Diagnosis Date   Adhesive capsulitis of shoulder    Anxiety    Aortic aneurysm    Breast cancer (HCC)    Cancer (HCC) 2008   Breast   Congenital anomaly of aortic arch    Labyrinthitis, unspecified    Malignant neoplasm of breast (female), unspecified site    Other B-complex deficiencies    Other malaise and fatigue    Personal history of chemotherapy    Personal history of radiation therapy 2008   Unspecified hearing loss    Unspecified hereditary and idiopathic peripheral neuropathy    Unspecified tinnitus     Allergies[1]   Review of Systems:  No headache,  visual changes, nausea, vomiting, diarrhea, constipation, dizziness, abdominal pain, skin rash, fevers, chills, night sweats, weight loss, swollen lymph nodes, body aches, joint swelling, chest pain, shortness of breath, mood changes. POSITIVE muscle aches  Objective  Blood pressure 118/82, pulse 74, height 5' 6 (1.676 m), weight 139 lb (63 kg), SpO2 97%.   General: No apparent distress alert and oriented x3 mood and affect normal, dressed appropriately.  HEENT: Pupils equal, extraocular movements intact  Respiratory: Patient's speak in full sentences and does not appear short of breath  Cardiovascular: No lower extremity edema, non tender, no erythema  Gait MSK:  Back some tightness noted all seems to be on the right side.  Neck exam does have some limited sidebending right greater than left.  Osteopathic findings  C2 flexed rotated and side bent right C7 flexed rotated and side bent right T3 extended rotated and side bent right inhaled rib T6 extended rotated and side bent right L2 flexed rotated and side bent right Sacrum right on right       Assessment and Plan:  Scapular dyskinesis Continue to work on the scapular dyskinesis.  Did get a significant amount of improvement today after manipulation after further evaluation which is the best we have seen movement since we have seen this patient.  Still concerned though discussed icing regimen and home exercises otherwise.  Follow-up again in 6 to 8 weeks.    Nonallopathic problems  Decision today to treat with OMT was based on Physical Exam  After verbal consent patient was treated with HVLA, ME, FPR techniques in cervical, rib, thoracic, lumbar, and sacral  areas  Patient tolerated the procedure well with improvement in symptoms  Patient given exercises, stretches and lifestyle modifications  See medications in patient instructions if given  Patient will follow up in 4-8 weeks     The above documentation has been  reviewed and is accurate and complete Sharanya Templin M Danica Camarena, DO         Note: This dictation was prepared with Dragon dictation along with smaller phrase technology. Any transcriptional errors that result from this process are unintentional.            [1]  Allergies Allergen Reactions   Bee Venom Anaphylaxis   Methylprednisolone  Other (See Comments) and Anaphylaxis    Injection only - has polysorbate 80 preservative   Other     Carageenen   Polysorbate Anaphylaxis    Polysorbate 80 preservative + PEG 20 Plus   Latex Hives   Lyrica  [Pregabalin ] Nausea And Vomiting and Other (See Comments)    Dizziness    Miralax [Polyethylene Glycol (Macrogol)] Hives    pruritus   Doxycycline  Hyclate Rash   "

## 2024-04-29 ENCOUNTER — Ambulatory Visit: Admitting: Family Medicine

## 2024-04-29 VITALS — BP 118/82 | HR 74 | Ht 66.0 in | Wt 139.0 lb

## 2024-04-29 DIAGNOSIS — M9901 Segmental and somatic dysfunction of cervical region: Secondary | ICD-10-CM | POA: Diagnosis not present

## 2024-04-29 DIAGNOSIS — G2589 Other specified extrapyramidal and movement disorders: Secondary | ICD-10-CM | POA: Diagnosis not present

## 2024-04-29 DIAGNOSIS — M9904 Segmental and somatic dysfunction of sacral region: Secondary | ICD-10-CM | POA: Diagnosis not present

## 2024-04-29 DIAGNOSIS — M9908 Segmental and somatic dysfunction of rib cage: Secondary | ICD-10-CM

## 2024-04-29 DIAGNOSIS — M9903 Segmental and somatic dysfunction of lumbar region: Secondary | ICD-10-CM

## 2024-04-29 DIAGNOSIS — M9902 Segmental and somatic dysfunction of thoracic region: Secondary | ICD-10-CM

## 2024-04-29 NOTE — Patient Instructions (Signed)
 Good response today In 2 weeks give us  an update It has been since 2017 since last MRI for neck and brain See us  in 2 months

## 2024-04-29 NOTE — Assessment & Plan Note (Signed)
 Continue to work on the scapular dyskinesis.  Did get a significant amount of improvement today after manipulation after further evaluation which is the best we have seen movement since we have seen this patient.  Still concerned though discussed icing regimen and home exercises otherwise.  Follow-up again in 6 to 8 weeks.

## 2024-05-01 ENCOUNTER — Ambulatory Visit (HOSPITAL_BASED_OUTPATIENT_CLINIC_OR_DEPARTMENT_OTHER)
Admission: RE | Admit: 2024-05-01 | Discharge: 2024-05-01 | Disposition: A | Discharge status: Home / Self Care | Source: Ambulatory Visit | Attending: Obstetrics and Gynecology | Admitting: Obstetrics and Gynecology

## 2024-05-01 DIAGNOSIS — Z780 Asymptomatic menopausal state: Secondary | ICD-10-CM | POA: Insufficient documentation

## 2024-05-01 DIAGNOSIS — Z13820 Encounter for screening for osteoporosis: Secondary | ICD-10-CM | POA: Insufficient documentation

## 2024-05-12 ENCOUNTER — Ambulatory Visit
Admission: RE | Admit: 2024-05-12 | Discharge: 2024-05-12 | Disposition: A | Source: Ambulatory Visit | Attending: Obstetrics and Gynecology | Admitting: Obstetrics and Gynecology

## 2024-05-12 DIAGNOSIS — Z0000 Encounter for general adult medical examination without abnormal findings: Secondary | ICD-10-CM

## 2024-05-13 ENCOUNTER — Other Ambulatory Visit: Payer: Self-pay | Admitting: Obstetrics and Gynecology

## 2024-05-13 DIAGNOSIS — N644 Mastodynia: Secondary | ICD-10-CM

## 2024-05-14 ENCOUNTER — Encounter: Payer: Self-pay | Admitting: Dermatology

## 2024-05-14 ENCOUNTER — Ambulatory Visit: Payer: 59 | Admitting: Dermatology

## 2024-05-14 DIAGNOSIS — D1801 Hemangioma of skin and subcutaneous tissue: Secondary | ICD-10-CM

## 2024-05-14 DIAGNOSIS — L814 Other melanin hyperpigmentation: Secondary | ICD-10-CM | POA: Diagnosis not present

## 2024-05-14 DIAGNOSIS — Z1283 Encounter for screening for malignant neoplasm of skin: Secondary | ICD-10-CM | POA: Diagnosis not present

## 2024-05-14 DIAGNOSIS — L821 Other seborrheic keratosis: Secondary | ICD-10-CM | POA: Diagnosis not present

## 2024-05-14 DIAGNOSIS — L738 Other specified follicular disorders: Secondary | ICD-10-CM

## 2024-05-14 DIAGNOSIS — W908XXA Exposure to other nonionizing radiation, initial encounter: Secondary | ICD-10-CM

## 2024-05-14 DIAGNOSIS — L578 Other skin changes due to chronic exposure to nonionizing radiation: Secondary | ICD-10-CM | POA: Diagnosis not present

## 2024-05-14 DIAGNOSIS — D229 Melanocytic nevi, unspecified: Secondary | ICD-10-CM

## 2024-05-14 NOTE — Progress Notes (Signed)
" ° ° °  Total Body Skin Exam (TBSE) Visit  Patient (and/or pt guardian) consented to the use of AI-assisted tools for note generation.    Subjective  [name] is a 64 y.o. female who presents for the following: Skin Cancer Screening and Full Body Skin Exam  Patient presents today for follow up visit for TBSE.  Patient was last evaluated on 05/16/23 .  Patient denies medication changes.  Patient reports she does not have spots, moles and lesions of concern to be evaluated.  Patient reports throughout her lifetime she has had moderate sun exposure.  Currently, patient reports if she has excessive sun exposure, she does apply sunscreen and/or wears protective coverings.  Patient reports she has hx of bx   HX of BCC - Mohs procedure done 2003  Patient denies  family history of skin cancers.  Patient would like to discuss spots on left hand Patient would like to discuss pores on face Patient would like to discuss itch on skin underarms   The patient has spots, moles and lesions to be evaluated, some may be new or changing and the patient has concerns that these could be cancer.  The following portions of the chart were reviewed this encounter and updated as appropriate: medications, allergies, medical history  Review of Systems:  No other skin or systemic complaints except as noted in HPI or Assessment and Plan.  Objective  Well appearing patient in no apparent distress; mood and affect are within normal limits.  A full examination was performed including scalp, head, eyes, ears, nose, lips, neck, chest, axillae, abdomen, back, buttocks, bilateral upper extremities, bilateral lower extremities, hands, feet, fingers, toes, fingernails, and toenails. All findings within normal limits unless otherwise noted below.   Relevant physical exam findings are noted in the Assessment and Plan.    Assessment & Plan   LENTIGINES, SEBORRHEIC KERATOSES, HEMANGIOMAS - Benign normal skin  lesions - Benign-appearing - Call for any changes  MELANOCYTIC NEVI - Tan-brown and/or pink-flesh-colored symmetric macules and papules - Benign appearing on exam today - Observation - Call clinic for new or changing moles - Recommend daily use of broad spectrum spf 30+ sunscreen to sun-exposed areas.   MILD ACTINIC DAMAGE - Chronic condition, secondary to cumulative UV/sun exposure - diffuse scaly erythematous macules with underlying dyspigmentation - Recommend daily broad spectrum sunscreen SPF 30+ to sun-exposed areas, reapply every 2 hours as needed.  - Staying in the shade or wearing long sleeves, sun glasses (UVA+UVB protection) and wide brim hats (4-inch brim around the entire circumference of the hat) are also recommended for sun protection.  - Call for new or changing lesions.  SKIN CANCER SCREENING PERFORMED TODAY.  Sebaceous Hyperplasia - Small yellow papules with a central dell - Benign-appearing - Observe. Call for changes. - Discussed cosmetic removal and quote given  Recommended Vanicream deodorant without aluminum chloride for underarms   Return in about 1 year (around 05/14/2025) for TBSE f/u.  [name], am acting as a neurosurgeon for [employer], DO .   Documentation: I have reviewed the above documentation for accuracy and completeness, and I agree with the above.  Delon Lenis, DO   "

## 2024-05-14 NOTE — Patient Instructions (Addendum)
 " VISIT SUMMARY:  During your visit, we conducted a routine skin check and discussed your history of skin cancer, as well as some new skin concerns you mentioned. We also addressed the itching in your underarms and reviewed your past experiences with lumpectomies and reconstruction.  YOUR PLAN:  -ANNUAL SKIN CANCER SURVEILLANCE:  No concerning lesions were identified during your skin check. Mild actinic damage was noted on your shoulders, likely due to past sun exposure. Actinic damage is a result of long-term sun exposure that can cause changes in the skin. We will continue with annual skin cancer surveillance to monitor for any new or concerning spots.   Samples of retinol cream were provided for the treatment of sebaceous hyperplasia, which are small bumps on the skin caused by enlarged oil glands.  -PRURITUS OF AXILLAE:  You have intermittent itching in your underarms without any visible rash. This could be due to irritation from deodorants. Pruritus means itching. We recommend trying Vanicream deodorant, which does not contain aluminum chloride, and using hydrocortisone cream as needed for relief. You might also consider using deodorant every other day or only when sweating.  -ACTINIC DAMAGE OF SHOULDERS:  Mild actinic damage was noted on your shoulders, which is likely due to past sun exposure.   Actinic damage refers to skin changes caused by long-term sun exposure. No concerning lesions were identified. We will continue with annual skin cancer surveillance to monitor your skin health.  INSTRUCTIONS:  Please continue with annual skin cancer surveillance. Try using Vanicream deodorant without aluminum chloride and use hydrocortisone cream as needed for itching in your underarms. Consider using deodorant every other day or only when sweating.    Other Procedure(Sebaceous Hyperplaysia): Quote for cosmetic removal:  $200 to remove Up to 15 lesions $300 to remove  16 to 25 $400 to  removal  26-35 $10 per tag for each additional       Important Information  Due to recent changes in healthcare laws, you may see results of your pathology and/or laboratory studies on MyChart before the doctors have had a chance to review them. We understand that in some cases there may be results that are confusing or concerning to you. Please understand that not all results are received at the same time and often the doctors may need to interpret multiple results in order to provide you with the best plan of care or course of treatment. Therefore, we ask that you please give us  2 business days to thoroughly review all your results before contacting the office for clarification. Should we see a critical lab result, you will be contacted sooner.   If You Need Anything After Your Visit  If you have any questions or concerns for your doctor, please call our main line at [phone] If no one answers, please leave a voicemail as directed and we will return your call as soon as possible. Messages left after 4 pm will be answered the following business day.   You may also send us  a message via MyChart. We typically respond to MyChart messages within 1-2 business days.  For prescription refills, please ask your pharmacy to contact our office. Our fax number is [phone].  If you have an urgent issue when the clinic is closed that cannot wait until the next business day, you can page your doctor at the number below.    Please note that while we do our best to be available for urgent issues outside of office hours, we are not  available 24/7.   If you have an urgent issue and are unable to reach us , you may choose to seek medical care at your doctor's office, retail clinic, urgent care center, or emergency room.  If you have a medical emergency, please immediately call 911 or go to the emergency department. In the event of inclement weather, please call our main line at [phone] for an  update on the status of any delays or closures.  Dermatology Medication Tips: Please keep the boxes that topical medications come in in order to help keep track of the instructions about where and how to use these. Pharmacies typically print the medication instructions only on the boxes and not directly on the medication tubes.   If your medication is too expensive, please contact our office at [phone] or send us  a message through MyChart.   We are unable to tell what your co-pay for medications will be in advance as this is different depending on your insurance coverage. However, we may be able to find a substitute medication at lower cost or fill out paperwork to get insurance to cover a needed medication.   If a prior authorization is required to get your medication covered by your insurance company, please allow us  1-2 business days to complete this process.  Drug prices often vary depending on where the prescription is filled and some pharmacies may offer cheaper prices.  The website www.goodrx.com contains coupons for medications through different pharmacies. The prices here do not account for what the cost may be with help from insurance (it may be cheaper with your insurance), but the website can give you the price if you did not use any insurance.  - You can print the associated coupon and take it with your prescription to the pharmacy.  - You may also stop by our office during regular business hours and pick up a GoodRx coupon card.  - If you need your prescription sent electronically to a different pharmacy, notify our office through [facility] or by phone at [phone]     "

## 2024-05-19 ENCOUNTER — Ambulatory Visit: Admitting: Neurology

## 2024-05-22 ENCOUNTER — Other Ambulatory Visit

## 2024-05-22 ENCOUNTER — Ambulatory Visit
Admission: RE | Admit: 2024-05-22 | Discharge: 2024-05-22 | Disposition: A | Source: Ambulatory Visit | Attending: Obstetrics and Gynecology | Admitting: Obstetrics and Gynecology

## 2024-05-22 ENCOUNTER — Encounter

## 2024-05-22 DIAGNOSIS — N644 Mastodynia: Secondary | ICD-10-CM

## 2024-05-28 ENCOUNTER — Other Ambulatory Visit (HOSPITAL_BASED_OUTPATIENT_CLINIC_OR_DEPARTMENT_OTHER): Payer: Self-pay

## 2024-05-28 ENCOUNTER — Other Ambulatory Visit: Payer: Self-pay | Admitting: Family Medicine

## 2024-05-28 MED ORDER — ESCITALOPRAM OXALATE 10 MG PO TABS
10.0000 mg | ORAL_TABLET | Freq: Every day | ORAL | 0 refills | Status: AC
  Filled 2024-05-28: qty 90, 90d supply, fill #0

## 2024-06-30 ENCOUNTER — Ambulatory Visit: Admitting: Family Medicine

## 2024-11-09 ENCOUNTER — Encounter: Admitting: Family Medicine

## 2024-11-20 ENCOUNTER — Encounter: Admitting: Family Medicine
